# Patient Record
Sex: Female | Born: 1950 | Race: White | Hispanic: No | Marital: Married | Smoking: Never smoker
Health system: Southern US, Community
[De-identification: ages and names within clinical notes are randomized; demographics above are authoritative.]

## PROBLEM LIST (undated history)

## (undated) DIAGNOSIS — I10 Essential (primary) hypertension: Secondary | ICD-10-CM

## (undated) DIAGNOSIS — E119 Type 2 diabetes mellitus without complications: Secondary | ICD-10-CM

## (undated) DIAGNOSIS — E66811 Obesity, class 1: Secondary | ICD-10-CM

## (undated) DIAGNOSIS — I341 Nonrheumatic mitral (valve) prolapse: Secondary | ICD-10-CM

## (undated) DIAGNOSIS — I639 Cerebral infarction, unspecified: Secondary | ICD-10-CM

## (undated) DIAGNOSIS — G43909 Migraine, unspecified, not intractable, without status migrainosus: Secondary | ICD-10-CM

## (undated) DIAGNOSIS — E669 Obesity, unspecified: Secondary | ICD-10-CM

## (undated) DIAGNOSIS — N200 Calculus of kidney: Secondary | ICD-10-CM

## (undated) DIAGNOSIS — G4730 Sleep apnea, unspecified: Secondary | ICD-10-CM

## (undated) HISTORY — PX: NECK SURGERY: SHX720

## (undated) HISTORY — PX: TONSILLECTOMY: SUR1361

## (undated) HISTORY — PX: CHOLECYSTECTOMY: SHX55

## (undated) HISTORY — PX: ANKLE SURGERY: SHX546

## (undated) HISTORY — PX: KNEE SURGERY: SHX244

## (undated) HISTORY — PX: ABDOMINAL HYSTERECTOMY: SHX81

---

## 2015-03-24 DIAGNOSIS — E039 Hypothyroidism, unspecified: Secondary | ICD-10-CM | POA: Diagnosis present

## 2015-05-05 DIAGNOSIS — R519 Headache, unspecified: Secondary | ICD-10-CM | POA: Diagnosis present

## 2015-10-28 DIAGNOSIS — M5030 Other cervical disc degeneration, unspecified cervical region: Secondary | ICD-10-CM | POA: Insufficient documentation

## 2016-08-09 DIAGNOSIS — E6609 Other obesity due to excess calories: Secondary | ICD-10-CM | POA: Insufficient documentation

## 2017-04-26 ENCOUNTER — Other Ambulatory Visit: Payer: Self-pay | Admitting: Physician Assistant

## 2017-04-26 DIAGNOSIS — R921 Mammographic calcification found on diagnostic imaging of breast: Secondary | ICD-10-CM

## 2017-04-29 ENCOUNTER — Ambulatory Visit
Admission: RE | Admit: 2017-04-29 | Discharge: 2017-04-29 | Disposition: A | Discharge status: Home / Self Care | Payer: Medicare Other | Source: Ambulatory Visit | Attending: Physician Assistant | Admitting: Physician Assistant

## 2017-04-29 DIAGNOSIS — R921 Mammographic calcification found on diagnostic imaging of breast: Secondary | ICD-10-CM

## 2021-02-07 ENCOUNTER — Emergency Department (HOSPITAL_COMMUNITY): Payer: Medicare Other

## 2021-02-07 ENCOUNTER — Inpatient Hospital Stay (HOSPITAL_COMMUNITY)
Admission: EM | Admit: 2021-02-07 | Discharge: 2021-02-10 | DRG: 062 | Disposition: A | Discharge status: Rehabilitation Facility | Payer: Medicare Other | Attending: Internal Medicine | Admitting: Internal Medicine

## 2021-02-07 DIAGNOSIS — R531 Weakness: Secondary | ICD-10-CM | POA: Diagnosis present

## 2021-02-07 DIAGNOSIS — M25511 Pain in right shoulder: Secondary | ICD-10-CM | POA: Diagnosis present

## 2021-02-07 DIAGNOSIS — I6381 Other cerebral infarction due to occlusion or stenosis of small artery: Principal | ICD-10-CM | POA: Diagnosis present

## 2021-02-07 DIAGNOSIS — Z8669 Personal history of other diseases of the nervous system and sense organs: Secondary | ICD-10-CM | POA: Diagnosis not present

## 2021-02-07 DIAGNOSIS — Z79899 Other long term (current) drug therapy: Secondary | ICD-10-CM | POA: Diagnosis not present

## 2021-02-07 DIAGNOSIS — R29810 Facial weakness: Secondary | ICD-10-CM | POA: Diagnosis present

## 2021-02-07 DIAGNOSIS — I10 Essential (primary) hypertension: Secondary | ICD-10-CM | POA: Diagnosis present

## 2021-02-07 DIAGNOSIS — Z887 Allergy status to serum and vaccine status: Secondary | ICD-10-CM

## 2021-02-07 DIAGNOSIS — K5900 Constipation, unspecified: Secondary | ICD-10-CM | POA: Diagnosis present

## 2021-02-07 DIAGNOSIS — G43909 Migraine, unspecified, not intractable, without status migrainosus: Secondary | ICD-10-CM | POA: Diagnosis present

## 2021-02-07 DIAGNOSIS — I6931 Attention and concentration deficit following cerebral infarction: Secondary | ICD-10-CM | POA: Diagnosis not present

## 2021-02-07 DIAGNOSIS — I639 Cerebral infarction, unspecified: Secondary | ICD-10-CM | POA: Diagnosis not present

## 2021-02-07 DIAGNOSIS — Z79890 Hormone replacement therapy (postmenopausal): Secondary | ICD-10-CM | POA: Diagnosis not present

## 2021-02-07 DIAGNOSIS — G4733 Obstructive sleep apnea (adult) (pediatric): Secondary | ICD-10-CM | POA: Diagnosis present

## 2021-02-07 DIAGNOSIS — E1165 Type 2 diabetes mellitus with hyperglycemia: Secondary | ICD-10-CM | POA: Diagnosis present

## 2021-02-07 DIAGNOSIS — I080 Rheumatic disorders of both mitral and aortic valves: Secondary | ICD-10-CM | POA: Diagnosis present

## 2021-02-07 DIAGNOSIS — Z91013 Allergy to seafood: Secondary | ICD-10-CM | POA: Diagnosis not present

## 2021-02-07 DIAGNOSIS — I69354 Hemiplegia and hemiparesis following cerebral infarction affecting left non-dominant side: Secondary | ICD-10-CM | POA: Diagnosis present

## 2021-02-07 DIAGNOSIS — Z881 Allergy status to other antibiotic agents status: Secondary | ICD-10-CM | POA: Diagnosis not present

## 2021-02-07 DIAGNOSIS — R270 Ataxia, unspecified: Secondary | ICD-10-CM | POA: Diagnosis present

## 2021-02-07 DIAGNOSIS — I341 Nonrheumatic mitral (valve) prolapse: Secondary | ICD-10-CM | POA: Diagnosis present

## 2021-02-07 DIAGNOSIS — Z6833 Body mass index (BMI) 33.0-33.9, adult: Secondary | ICD-10-CM

## 2021-02-07 DIAGNOSIS — G8194 Hemiplegia, unspecified affecting left nondominant side: Secondary | ICD-10-CM | POA: Diagnosis present

## 2021-02-07 DIAGNOSIS — Z7902 Long term (current) use of antithrombotics/antiplatelets: Secondary | ICD-10-CM | POA: Diagnosis not present

## 2021-02-07 DIAGNOSIS — E119 Type 2 diabetes mellitus without complications: Secondary | ICD-10-CM | POA: Diagnosis present

## 2021-02-07 DIAGNOSIS — K219 Gastro-esophageal reflux disease without esophagitis: Secondary | ICD-10-CM | POA: Diagnosis present

## 2021-02-07 DIAGNOSIS — W19XXXA Unspecified fall, initial encounter: Secondary | ICD-10-CM | POA: Diagnosis present

## 2021-02-07 DIAGNOSIS — E669 Obesity, unspecified: Secondary | ICD-10-CM | POA: Diagnosis present

## 2021-02-07 DIAGNOSIS — Z888 Allergy status to other drugs, medicaments and biological substances status: Secondary | ICD-10-CM | POA: Diagnosis not present

## 2021-02-07 DIAGNOSIS — E785 Hyperlipidemia, unspecified: Secondary | ICD-10-CM | POA: Diagnosis present

## 2021-02-07 DIAGNOSIS — G43709 Chronic migraine without aura, not intractable, without status migrainosus: Secondary | ICD-10-CM | POA: Diagnosis not present

## 2021-02-07 DIAGNOSIS — Z7982 Long term (current) use of aspirin: Secondary | ICD-10-CM

## 2021-02-07 DIAGNOSIS — E1169 Type 2 diabetes mellitus with other specified complication: Secondary | ICD-10-CM | POA: Diagnosis not present

## 2021-02-07 DIAGNOSIS — Z91041 Radiographic dye allergy status: Secondary | ICD-10-CM | POA: Diagnosis not present

## 2021-02-07 DIAGNOSIS — J45909 Unspecified asthma, uncomplicated: Secondary | ICD-10-CM | POA: Diagnosis present

## 2021-02-07 DIAGNOSIS — Z20822 Contact with and (suspected) exposure to covid-19: Secondary | ICD-10-CM | POA: Diagnosis present

## 2021-02-07 DIAGNOSIS — E039 Hypothyroidism, unspecified: Secondary | ICD-10-CM | POA: Diagnosis present

## 2021-02-07 DIAGNOSIS — R471 Dysarthria and anarthria: Secondary | ICD-10-CM | POA: Diagnosis present

## 2021-02-07 DIAGNOSIS — I6389 Other cerebral infarction: Secondary | ICD-10-CM | POA: Diagnosis not present

## 2021-02-07 DIAGNOSIS — S46001S Unspecified injury of muscle(s) and tendon(s) of the rotator cuff of right shoulder, sequela: Secondary | ICD-10-CM | POA: Diagnosis not present

## 2021-02-07 DIAGNOSIS — Z880 Allergy status to penicillin: Secondary | ICD-10-CM

## 2021-02-07 DIAGNOSIS — Z7984 Long term (current) use of oral hypoglycemic drugs: Secondary | ICD-10-CM | POA: Diagnosis not present

## 2021-02-07 DIAGNOSIS — R29703 NIHSS score 3: Secondary | ICD-10-CM | POA: Diagnosis present

## 2021-02-07 DIAGNOSIS — Z91018 Allergy to other foods: Secondary | ICD-10-CM

## 2021-02-07 DIAGNOSIS — R200 Anesthesia of skin: Secondary | ICD-10-CM

## 2021-02-07 DIAGNOSIS — F419 Anxiety disorder, unspecified: Secondary | ICD-10-CM | POA: Diagnosis present

## 2021-02-07 DIAGNOSIS — N179 Acute kidney failure, unspecified: Secondary | ICD-10-CM | POA: Diagnosis present

## 2021-02-07 LAB — CBC
HCT: 36.8 % (ref 36.0–46.0)
Hemoglobin: 12.1 g/dL (ref 12.0–15.0)
MCH: 27.4 pg (ref 26.0–34.0)
MCHC: 32.9 g/dL (ref 30.0–36.0)
MCV: 83.3 fL (ref 80.0–100.0)
Platelets: 261 10*3/uL (ref 150–400)
RBC: 4.42 MIL/uL (ref 3.87–5.11)
RDW: 13.3 % (ref 11.5–15.5)
WBC: 10.6 10*3/uL — ABNORMAL HIGH (ref 4.0–10.5)
nRBC: 0 % (ref 0.0–0.2)

## 2021-02-07 LAB — COMPREHENSIVE METABOLIC PANEL
ALT: 27 U/L (ref 0–44)
AST: 26 U/L (ref 15–41)
Albumin: 3.6 g/dL (ref 3.5–5.0)
Alkaline Phosphatase: 70 U/L (ref 38–126)
Anion gap: 12 (ref 5–15)
BUN: 17 mg/dL (ref 8–23)
CO2: 23 mmol/L (ref 22–32)
Calcium: 9.3 mg/dL (ref 8.9–10.3)
Chloride: 100 mmol/L (ref 98–111)
Creatinine, Ser: 1.01 mg/dL — ABNORMAL HIGH (ref 0.44–1.00)
GFR, Estimated: >60 mL/min (ref >60)
Glucose, Bld: 373 mg/dL — ABNORMAL HIGH (ref 70–99)
Potassium: 4.2 mmol/L (ref 3.5–5.1)
Sodium: 135 mmol/L (ref 135–145)
Total Bilirubin: 0.6 mg/dL (ref 0.3–1.2)
Total Protein: 7.1 g/dL (ref 6.5–8.1)

## 2021-02-07 LAB — DIFFERENTIAL
Abs Immature Granulocytes: 0.07 10*3/uL (ref 0.00–0.07)
Basophils Absolute: 0.1 10*3/uL (ref 0.0–0.1)
Basophils Relative: 1 %
Eosinophils Absolute: 0.4 10*3/uL (ref 0.0–0.5)
Eosinophils Relative: 4 %
Immature Granulocytes: 1 %
Lymphocytes Relative: 18 %
Lymphs Abs: 1.9 10*3/uL (ref 0.7–4.0)
Monocytes Absolute: 0.4 10*3/uL (ref 0.1–1.0)
Monocytes Relative: 4 %
Neutro Abs: 7.8 10*3/uL — ABNORMAL HIGH (ref 1.7–7.7)
Neutrophils Relative %: 72 %

## 2021-02-07 LAB — I-STAT CHEM 8, ED
BUN: 18 mg/dL (ref 8–23)
Calcium, Ion: 1.04 mmol/L — ABNORMAL LOW (ref 1.15–1.40)
Chloride: 101 mmol/L (ref 98–111)
Creatinine, Ser: 0.8 mg/dL (ref 0.44–1.00)
Glucose, Bld: 382 mg/dL — ABNORMAL HIGH (ref 70–99)
HCT: 36 % (ref 36.0–46.0)
Hemoglobin: 12.2 g/dL (ref 12.0–15.0)
Potassium: 4.1 mmol/L (ref 3.5–5.1)
Sodium: 135 mmol/L (ref 135–145)
TCO2: 24 mmol/L (ref 22–32)

## 2021-02-07 LAB — CBG MONITORING, ED: Glucose-Capillary: 408 mg/dL — ABNORMAL HIGH (ref 70–99)

## 2021-02-07 LAB — APTT: aPTT: 30 seconds (ref 24–36)

## 2021-02-07 LAB — PROTIME-INR
INR: 1 (ref 0.8–1.2)
Prothrombin Time: 13 seconds (ref 11.4–15.2)

## 2021-02-07 IMAGING — MR MR HEAD W/O CM
12 of 14 series · 33 of 48 positions shown · IV contrast (gadavist)
Comparison: Head CT from earlier the same day.

CLINICAL DATA: Initial evaluation for neuro deficit, stroke
suspected.

EXAM:
MRI HEAD WITHOUT CONTRAST
MRA HEAD WITHOUT CONTRAST
MRA NECK WITHOUT AND WITH CONTRAST
TECHNIQUE: Multiplanar, multi-echo pulse sequences of the brain and surrounding
structures were acquired without intravenous contrast. Angiographic
images of the Circle of Willis were acquired using MRA technique
without intravenous contrast. Angiographic images of the neck were
acquired using MRA technique without and with intravenous contrast.
Carotid stenosis measurements (when applicable) are obtained
utilizing NASCET criteria, using the distal internal carotid
diameter as the denominator.
CONTRAST:  7mL GADAVIST GADOBUTROL 1 MMOL/ML IV SOLN

[Series 5: DWI · axial · 3.0mm · 0.88mm/px · z∈[-128,+18]mm · 7 of 100 slices shown (1 of 4)]
[im 1/100]
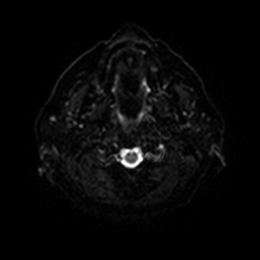
[im 17/100]
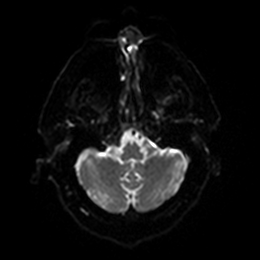
[im 34/100]
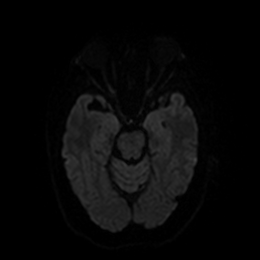
[im 50/100]
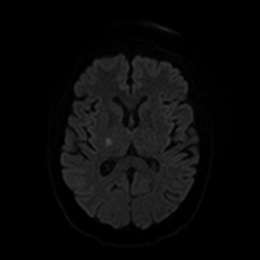
[im 67/100]
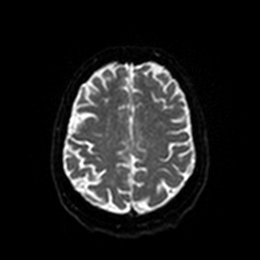
[im 83/100]
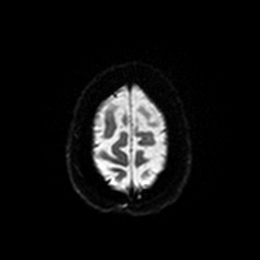
[im 100/100]
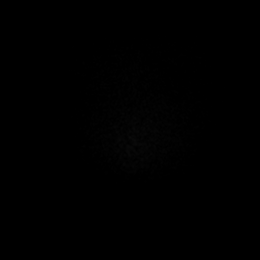

[Series 6: DWI · axial · 3.0mm · 0.88mm/px · z∈[-128,+18]mm · 3 of 50 slices shown (2 of 4)]
[im 1/50]
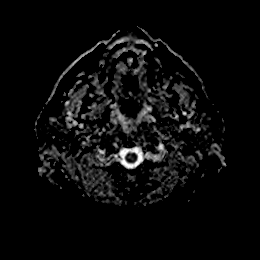
[im 25/50]
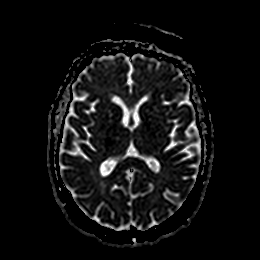
[im 50/50]
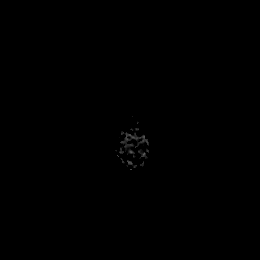

[Series 7: DWI · coronal · 4.0mm · 0.88mm/px · 4 of 68 slices shown (3 of 4)]
[im 1/68]
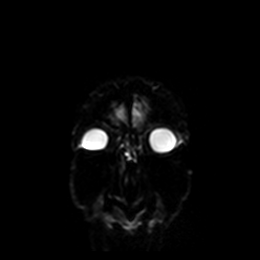
[im 23/68]
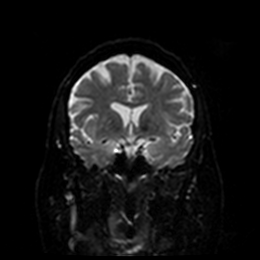
[im 45/68]
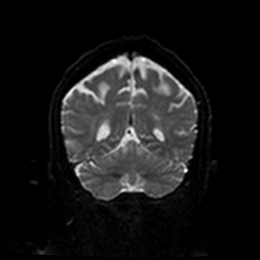
[im 68/68]
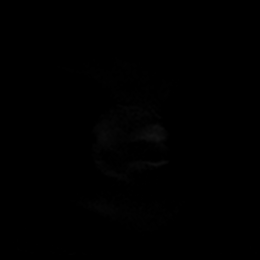

[Series 8: DWI · coronal · 4.0mm · 0.88mm/px · 2 of 34 slices shown (4 of 4)]
[im 1/34]
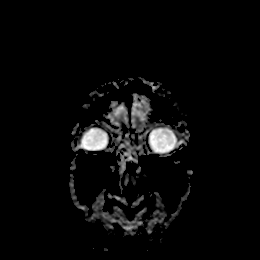
[im 34/34]
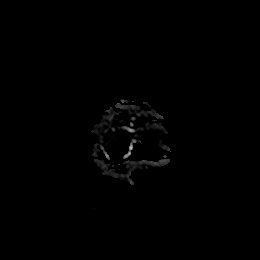

[Series 9: FLAIR · axial · 5.0mm · 0.45mm/px · 1 of 25 slices shown]
[im 1/25]
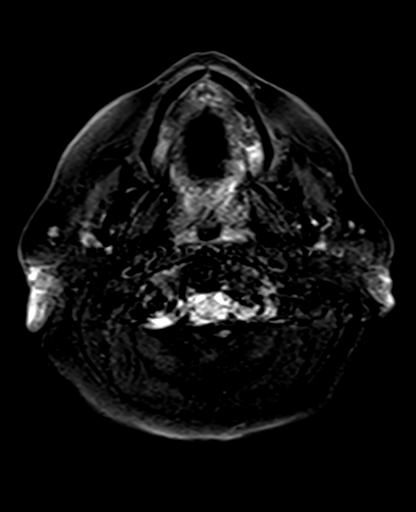

[Series 10: mag_images · axial · 3.0mm · 0.90mm/px · z∈[-131,+22]mm · 3 of 52 slices shown]
[im 1/52]
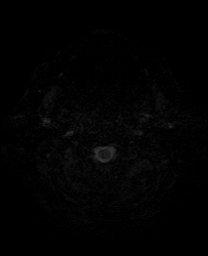
[im 26/52]
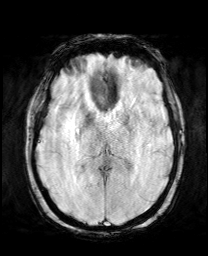
[im 52/52]
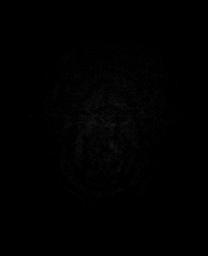

[Series 11: pha_images · axial · 3.0mm · 0.90mm/px · z∈[-131,+19]mm · 3 of 51 slices shown]
[im 1/51]
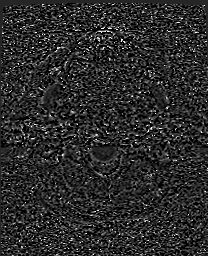
[im 26/51]
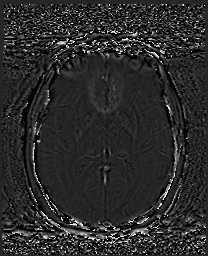
[im 51/51]
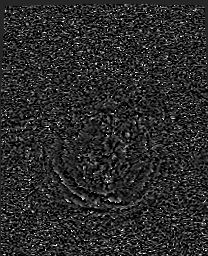

[Series 12: swi_images · axial · 3.0mm · 0.90mm/px · z∈[-131,+22]mm · 3 of 52 slices shown]
[im 1/52]
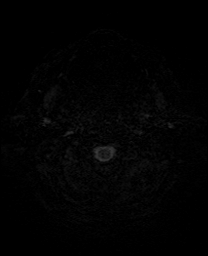
[im 26/52]
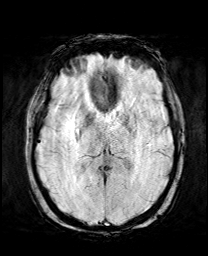
[im 52/52]
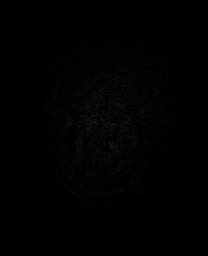

[Series 13: mip_images(sw) · axial · 24.0mm · 0.90mm/px · z∈[-120,+11]mm · 3 of 45 slices shown]
[im 1/45]
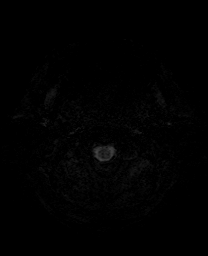
[im 23/45]
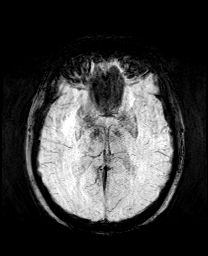
[im 45/45]
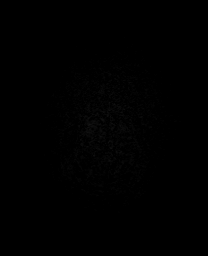

[Series 18: T1 · sagittal · 5.0mm · 0.75mm/px · 1 of 23 slices shown]
[im 1/23]
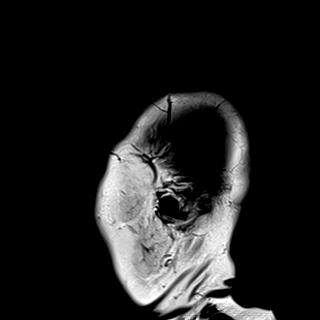

[Series 19: T2 · axial · 5.0mm · 0.72mm/px · 1 of 25 slices shown (1 of 2)]
[im 1/25]
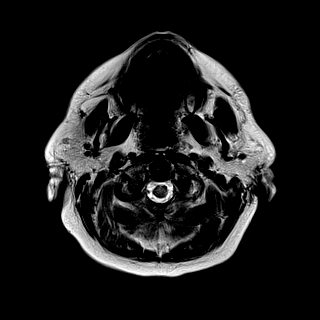

[Series 21: T2 · coronal · 4.0mm · 0.34mm/px · 2 of 34 slices shown (2 of 2)]
[im 1/34]
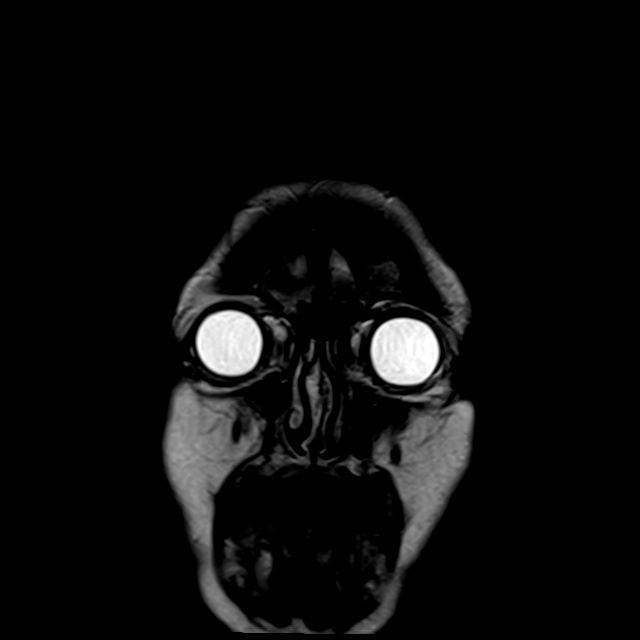
[im 34/34]
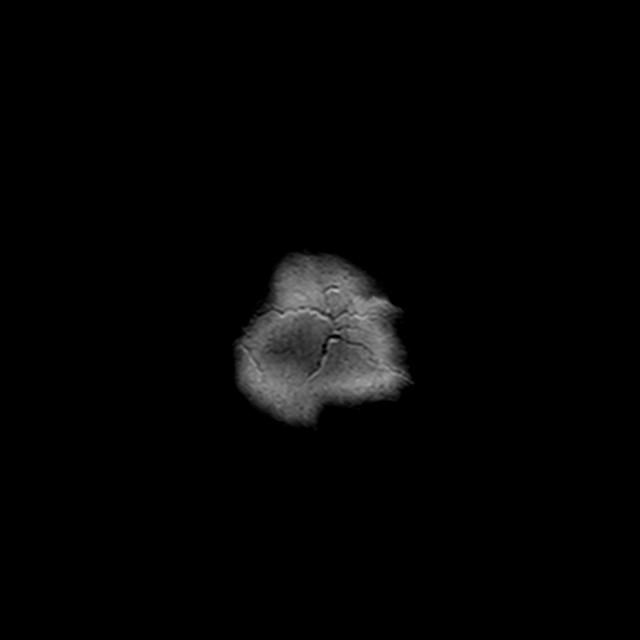

[33 of 48 positions shown; findings below may reference images not displayed]

FINDINGS: MRI HEAD FINDINGS

Brain: Cerebral volume within normal limits. Minimal chronic
microvascular ischemic disease noted involving the pons.

7 mm focus of restricted diffusion seen involving the posterior limb
of the right internal capsule, consistent with an acute ischemic
infarct (series 5, image 75). No associated hemorrhage or mass
effect. No other evidence for acute or subacute ischemia. No other
areas of chronic cortical infarction. No evidence for acute or
chronic intracranial hemorrhage.

No mass lesion or midline shift. No hydrocephalus or extra-axial
fluid collection. Pituitary gland suprasellar region normal.

Vascular: Major intracranial vascular flow voids are well
maintained.

Skull and upper cervical spine: Craniocervical junction within
normal limits. Bone marrow signal intensity normal. No scalp soft
tissue abnormality.

Sinuses/Orbits: Patient status post bilateral ocular lens
replacement. Paranasal sinuses are largely clear. No significant
mastoid effusion. Inner ear structures grossly normal.

Other: None.

MRA HEAD FINDINGS

Anterior circulation: Examination mildly degraded by motion.

Visualized distal cervical segments of the internal carotid arteries
are patent with antegrade flow. Petrous, cavernous, and supraclinoid
segments patent without stenosis or other abnormality. Eight A1
segments widely patent. Normal anterior communicating artery
complex. Anterior cerebral arteries patent without stenosis. No M1
stenosis or occlusion. Normal MCA bifurcations. Distal MCA branches
perfused and symmetric.

Posterior circulation: Both V4 segments patent without stenosis.
Right vertebral artery dominant. Both PICA patent at their origins.
Basilar widely patent to its distal aspect. Superior cerebellar and
posterior cerebral arteries widely patent bilaterally.

Anatomic variants: None significant.  No aneurysm.

MRA NECK FINDINGS

Aortic arch: Examination degraded by motion artifact.

Visualized aortic arch normal caliber with normal branch pattern. No
hemodynamically significant stenosis seen about the origin the great
vessels.

Right carotid system: Right common and internal carotid arteries
widely patent without stenosis, evidence for dissection or
occlusion.

Left carotid system: Left common and internal carotid arteries
widely patent without stenosis, evidence for dissection or
occlusion.

Vertebral arteries: Both vertebral arteries arise from the
subclavian arteries. No proximal subclavian artery stenosis. Both
vertebral arteries widely patent without stenosis, evidence for
dissection or occlusion. Right vertebral artery dominant.

Other: None
IMPRESSION: MRI HEAD IMPRESSION:

1. 7 mm acute ischemic nonhemorrhagic infarct involving the
posterior limb of the right internal capsule.
2. Mild chronic microvascular ischemic disease involving the pons.

MRA HEAD IMPRESSION:

Normal intracranial MRA. No large vessel occlusion, hemodynamically
significant stenosis, or other acute vascular abnormality.

MRA NECK IMPRESSION:

Normal MRA of the neck.

## 2021-02-07 IMAGING — MR MR MRA NECK WO/W CM
5 of 8 series · 26 of 48 positions shown · IV contrast (7 mL GADAVIST)
Comparison: Head CT from earlier the same day.

CLINICAL DATA: Initial evaluation for neuro deficit, stroke
suspected.

EXAM:
MRI HEAD WITHOUT CONTRAST
MRA HEAD WITHOUT CONTRAST
MRA NECK WITHOUT AND WITH CONTRAST
TECHNIQUE: Multiplanar, multi-echo pulse sequences of the brain and surrounding
structures were acquired without intravenous contrast. Angiographic
images of the Circle of Willis were acquired using MRA technique
without intravenous contrast. Angiographic images of the neck were
acquired using MRA technique without and with intravenous contrast.
Carotid stenosis measurements (when applicable) are obtained
utilizing NASCET criteria, using the distal internal carotid
diameter as the denominator.
CONTRAST:  7mL GADAVIST GADOBUTROL 1 MMOL/ML IV SOLN

[Series 8: tof_fl3d_tra_iso · axial · B · 0.6mm · 0.52mm/px · z∈[-279,-105]mm · 9 of 292 slices shown]
[im 1/292]
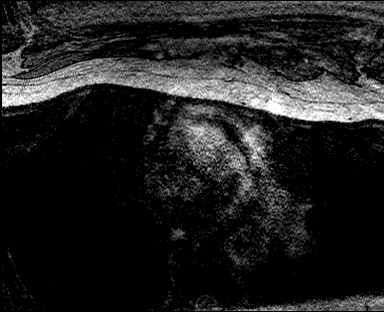
[im 49/292]
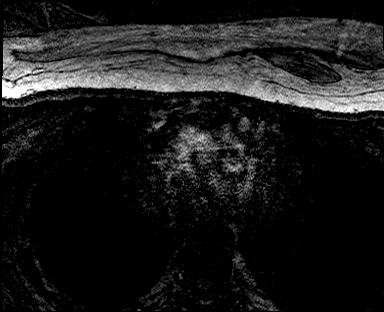
[im 98/292]
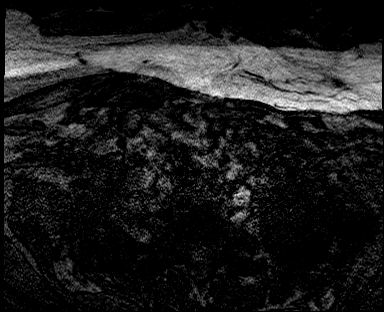
[im 122/292]
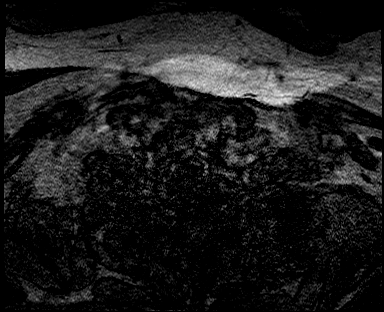
[im 146/292]
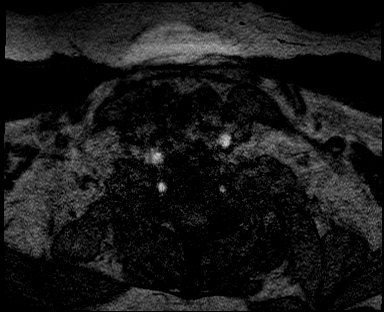
[im 170/292]
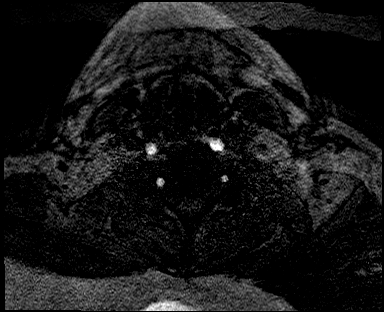
[im 195/292]
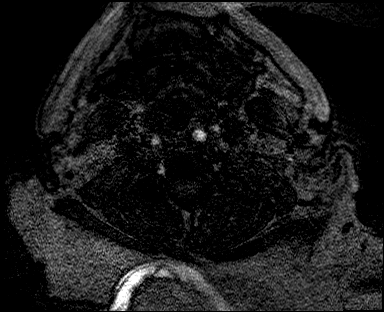
[im 243/292]
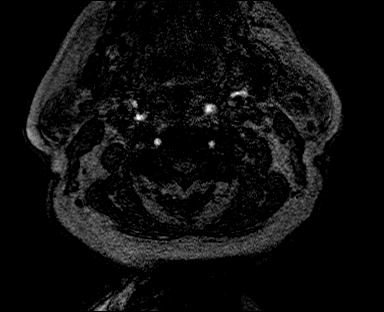
[im 292/292]
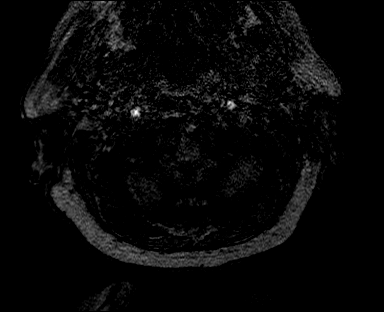

[Series 11: angio_fl3d_cor_highres_pre_ttc=3.0s · coronal · B · 0.9mm · 0.62mm/px · 5 of 112 slices shown]
[im 1/112]
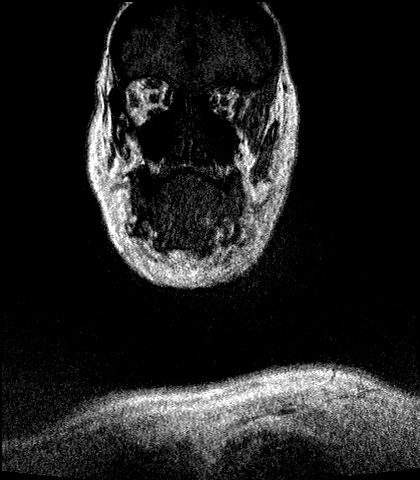
[im 28/112]
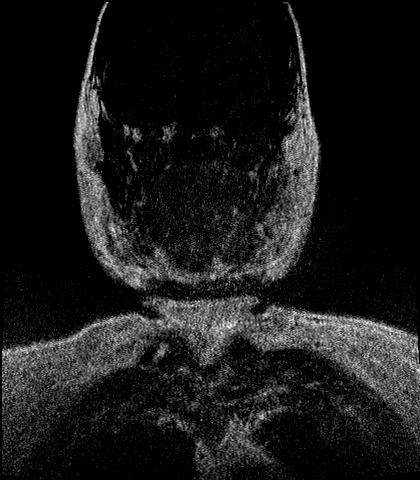
[im 56/112]
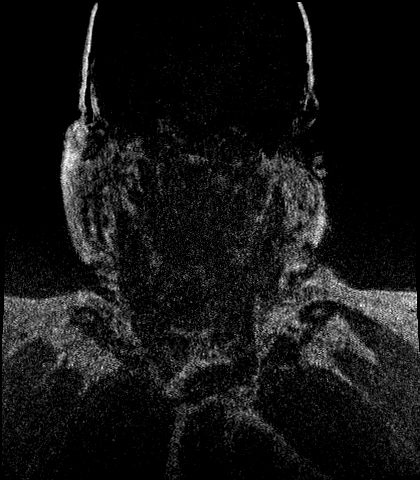
[im 84/112]
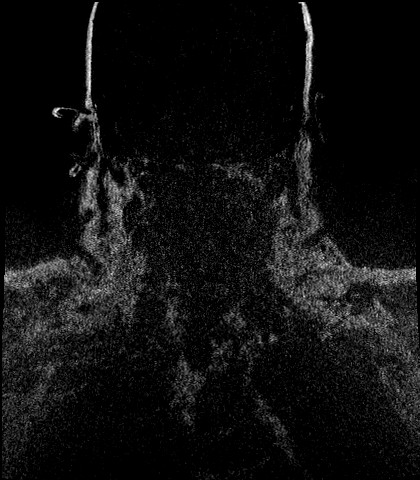
[im 112/112]
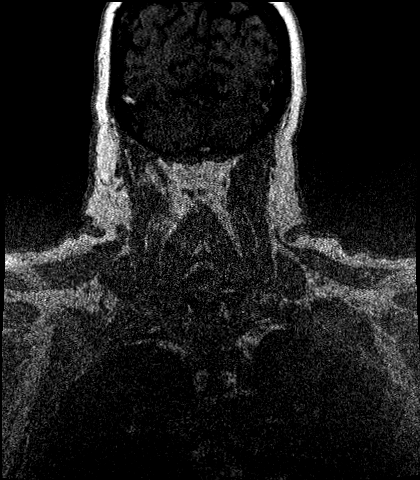

[Series 13: angio_fl3d_cor_highres_post_ttc=3.0s · coronal · B · 0.9mm · 0.62mm/px · 5 of 112 slices shown]
[im 1/112]
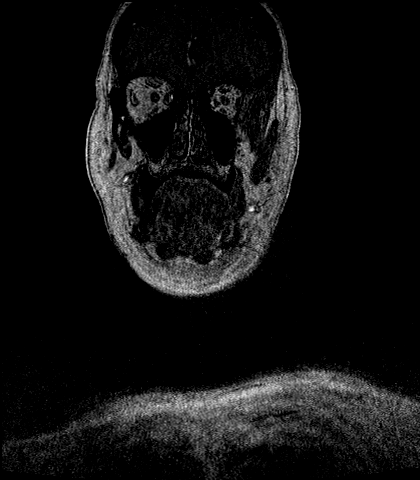
[im 28/112]
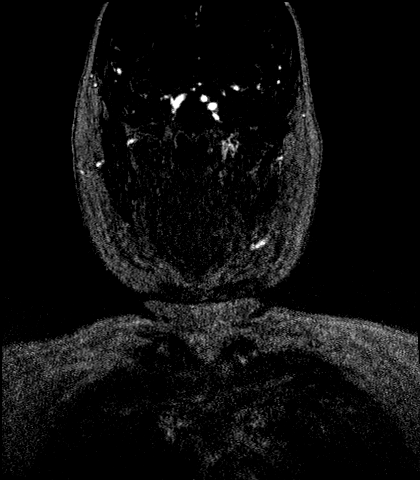
[im 56/112]
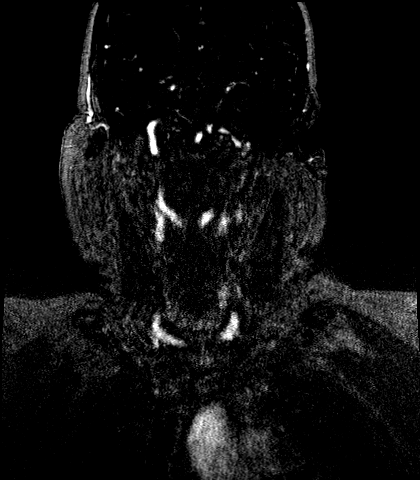
[im 84/112]
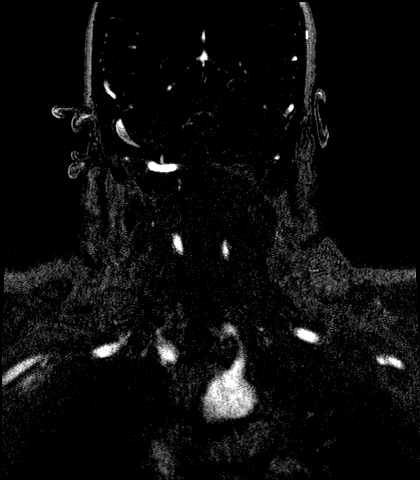
[im 112/112]
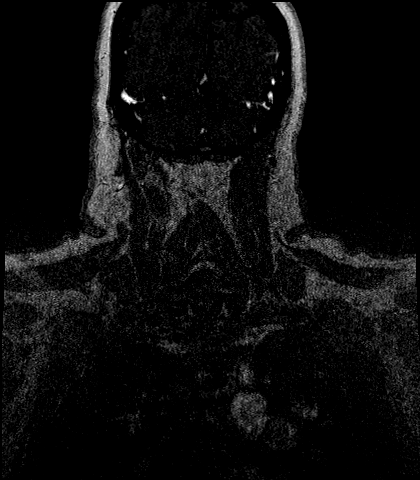

[Series 14: angio_fl3d_cor_highres_post_ttc=3.0s_moco-adv · coronal · B · 0.9mm · 0.62mm/px · 5 of 112 slices shown]
[im 1/112]
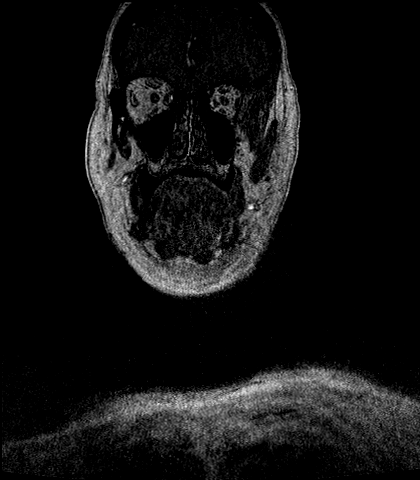
[im 28/112]
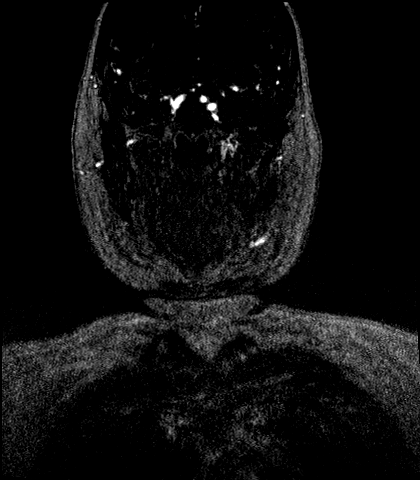
[im 56/112]
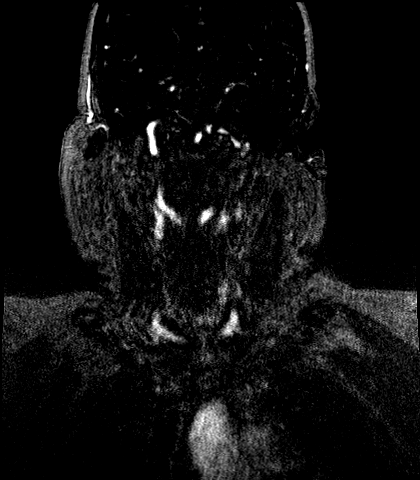
[im 84/112]
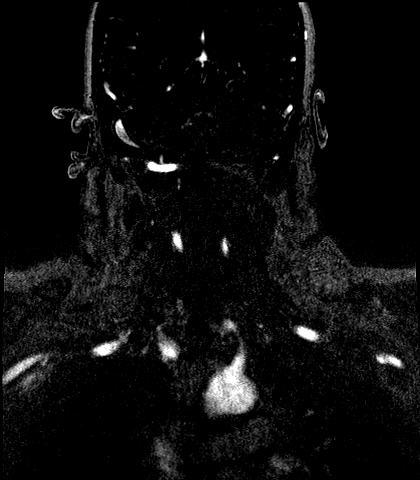
[im 112/112]
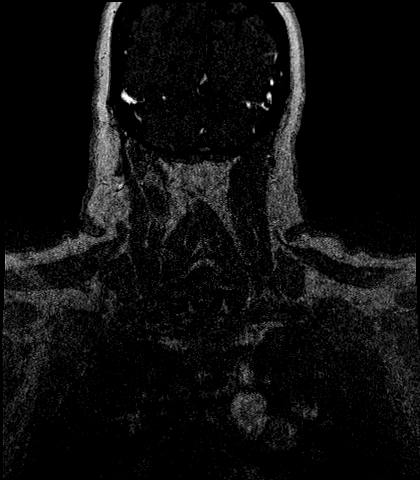

[Series 15: angio_fl3d_cor_highres_post_ttc=3.0s_moco-adv_sub · coronal · B · 0.9mm · 0.62mm/px · 2 of 112 slices shown]
[im 1/112]
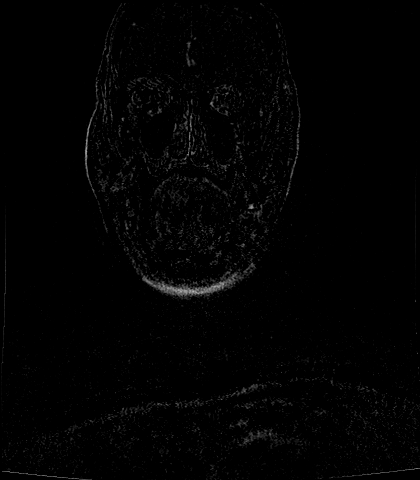
[im 28/112]
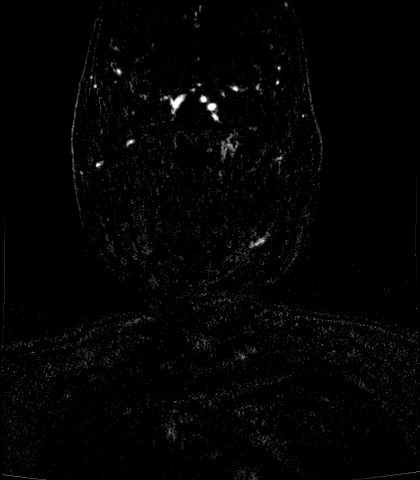

[26 of 48 positions shown; findings below may reference images not displayed]

FINDINGS: MRI HEAD FINDINGS

Brain: Cerebral volume within normal limits. Minimal chronic
microvascular ischemic disease noted involving the pons.

7 mm focus of restricted diffusion seen involving the posterior limb
of the right internal capsule, consistent with an acute ischemic
infarct (series 5, image 75). No associated hemorrhage or mass
effect. No other evidence for acute or subacute ischemia. No other
areas of chronic cortical infarction. No evidence for acute or
chronic intracranial hemorrhage.

No mass lesion or midline shift. No hydrocephalus or extra-axial
fluid collection. Pituitary gland suprasellar region normal.

Vascular: Major intracranial vascular flow voids are well
maintained.

Skull and upper cervical spine: Craniocervical junction within
normal limits. Bone marrow signal intensity normal. No scalp soft
tissue abnormality.

Sinuses/Orbits: Patient status post bilateral ocular lens
replacement. Paranasal sinuses are largely clear. No significant
mastoid effusion. Inner ear structures grossly normal.

Other: None.

MRA HEAD FINDINGS

Anterior circulation: Examination mildly degraded by motion.

Visualized distal cervical segments of the internal carotid arteries
are patent with antegrade flow. Petrous, cavernous, and supraclinoid
segments patent without stenosis or other abnormality. Eight A1
segments widely patent. Normal anterior communicating artery
complex. Anterior cerebral arteries patent without stenosis. No M1
stenosis or occlusion. Normal MCA bifurcations. Distal MCA branches
perfused and symmetric.

Posterior circulation: Both V4 segments patent without stenosis.
Right vertebral artery dominant. Both PICA patent at their origins.
Basilar widely patent to its distal aspect. Superior cerebellar and
posterior cerebral arteries widely patent bilaterally.

Anatomic variants: None significant.  No aneurysm.

MRA NECK FINDINGS

Aortic arch: Examination degraded by motion artifact.

Visualized aortic arch normal caliber with normal branch pattern. No
hemodynamically significant stenosis seen about the origin the great
vessels.

Right carotid system: Right common and internal carotid arteries
widely patent without stenosis, evidence for dissection or
occlusion.

Left carotid system: Left common and internal carotid arteries
widely patent without stenosis, evidence for dissection or
occlusion.

Vertebral arteries: Both vertebral arteries arise from the
subclavian arteries. No proximal subclavian artery stenosis. Both
vertebral arteries widely patent without stenosis, evidence for
dissection or occlusion. Right vertebral artery dominant.

Other: None
IMPRESSION: MRI HEAD IMPRESSION:

1. 7 mm acute ischemic nonhemorrhagic infarct involving the
posterior limb of the right internal capsule.
2. Mild chronic microvascular ischemic disease involving the pons.

MRA HEAD IMPRESSION:

Normal intracranial MRA. No large vessel occlusion, hemodynamically
significant stenosis, or other acute vascular abnormality.

MRA NECK IMPRESSION:

Normal MRA of the neck.

## 2021-02-07 IMAGING — CT CT HEAD CODE STROKE
3 series · 14 of 47 positions shown, 16 images · non-contrast
Comparison: CT from [DATE].

CLINICAL DATA: Code stroke. Initial evaluation for acute stroke.
Right-sided facial droop.

EXAM:
CT HEAD WITHOUT CONTRAST
TECHNIQUE: Contiguous axial images were obtained from the base of the skull
through the vertex without intravenous contrast.

[Series 2: head 5.0 st · axial · 0.40mm/px · z∈[-84,+51]mm · 8 of 33 slices shown, 10 images]
[im 3/33  brain]
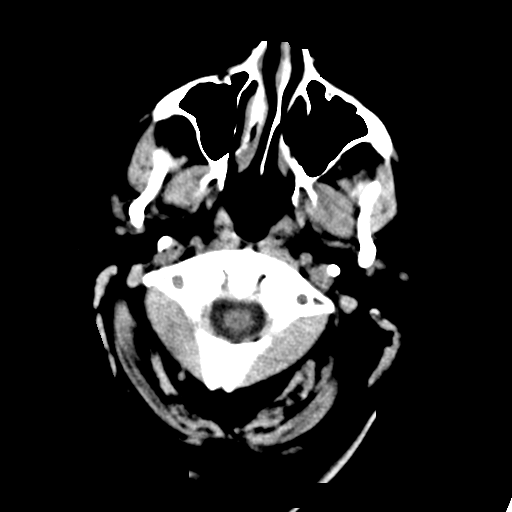
[im 3/33  bone]
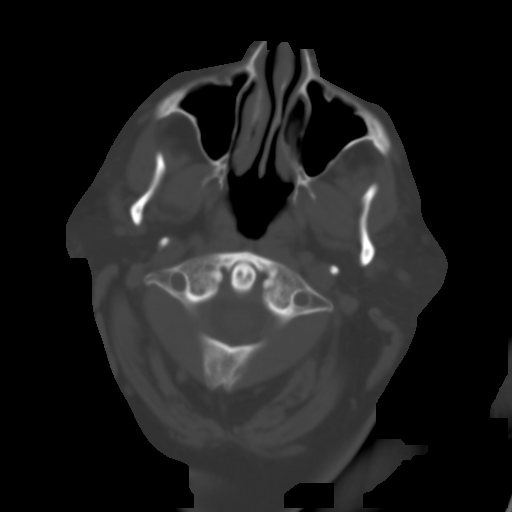
[im 7/33  brain]
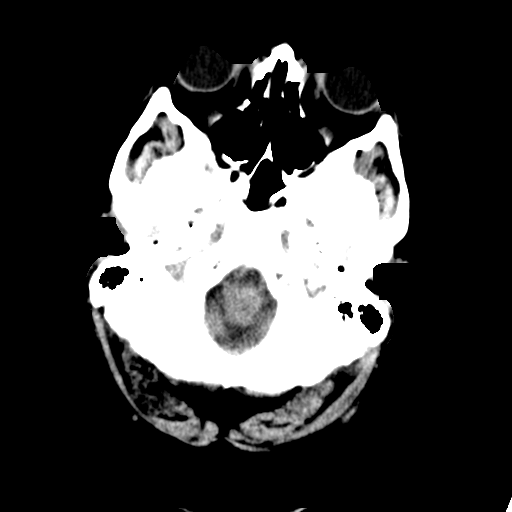
[im 10/33  brain]
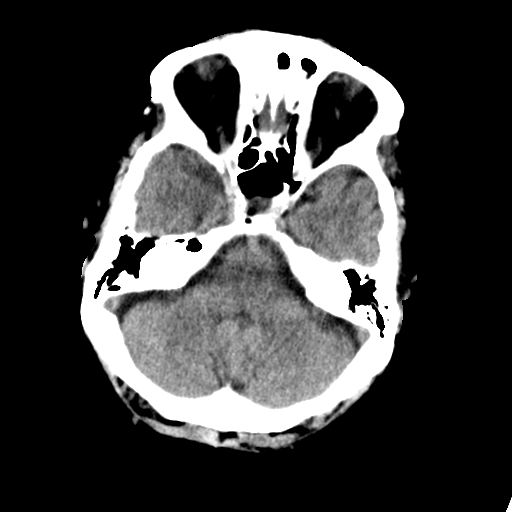
[im 15/33  brain]
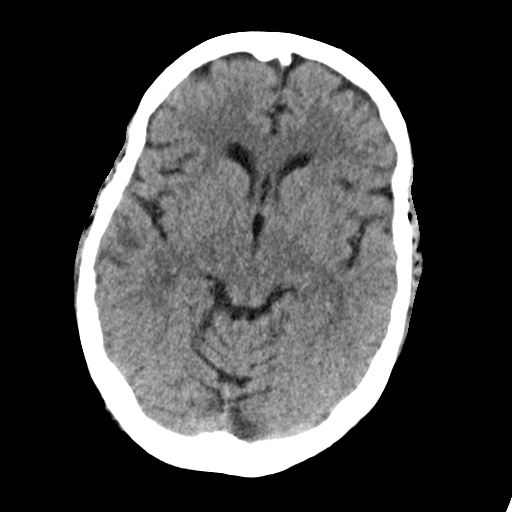
[im 18/33  brain]
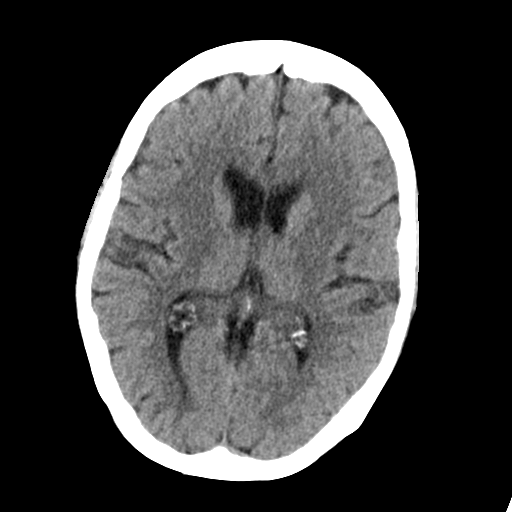
[im 18/33  bone]
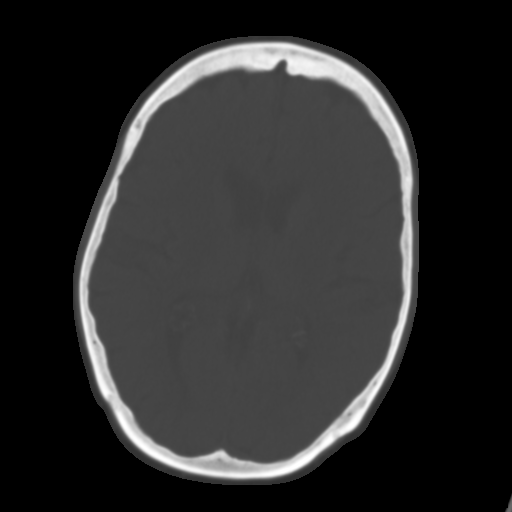
[im 23/33  brain]
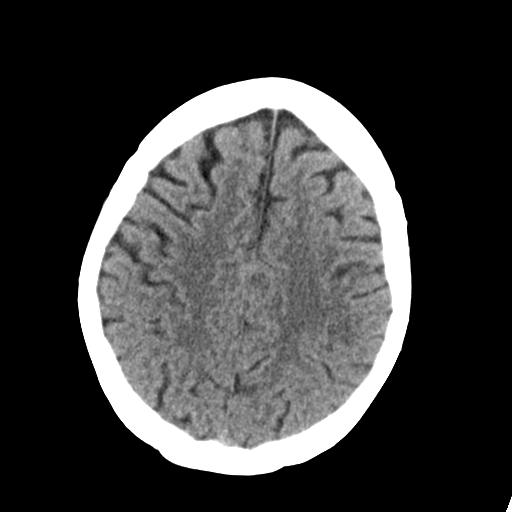
[im 26/33  brain]
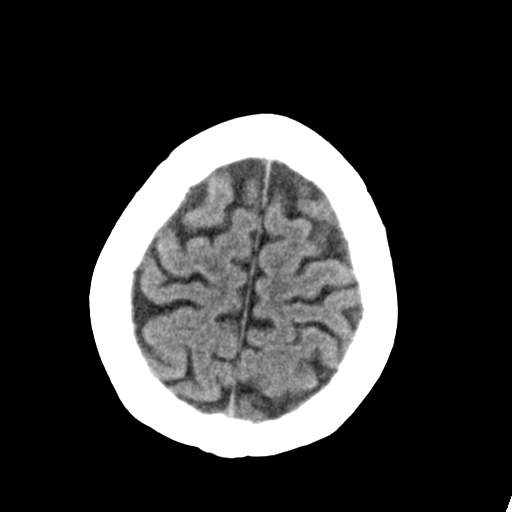
[im 30/33  brain]
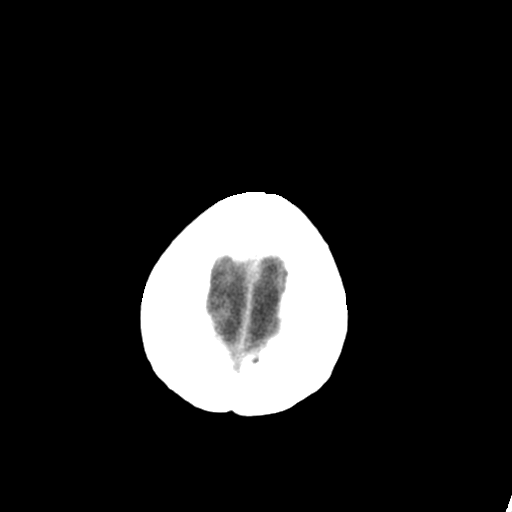

[Series 5: head 3.0 cor st · coronal · 0.31mm/px · 3 of 67 slices shown]
[im 23/67  brain]
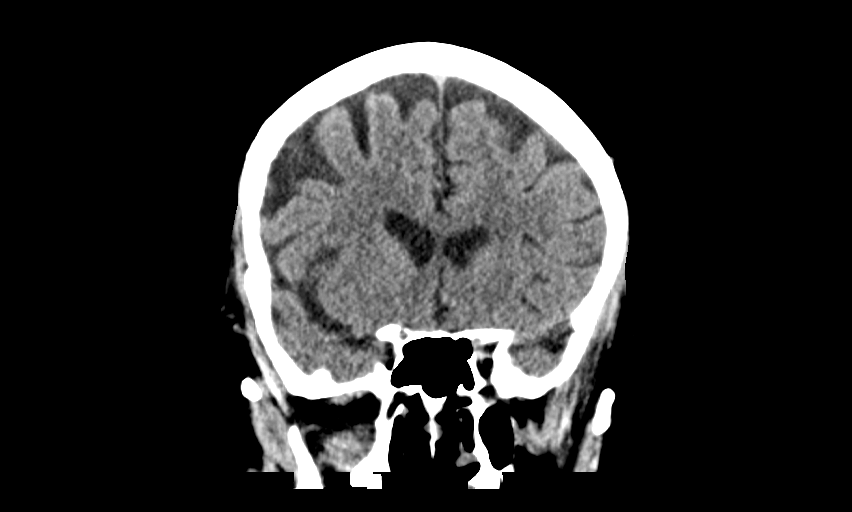
[im 30/67  brain]
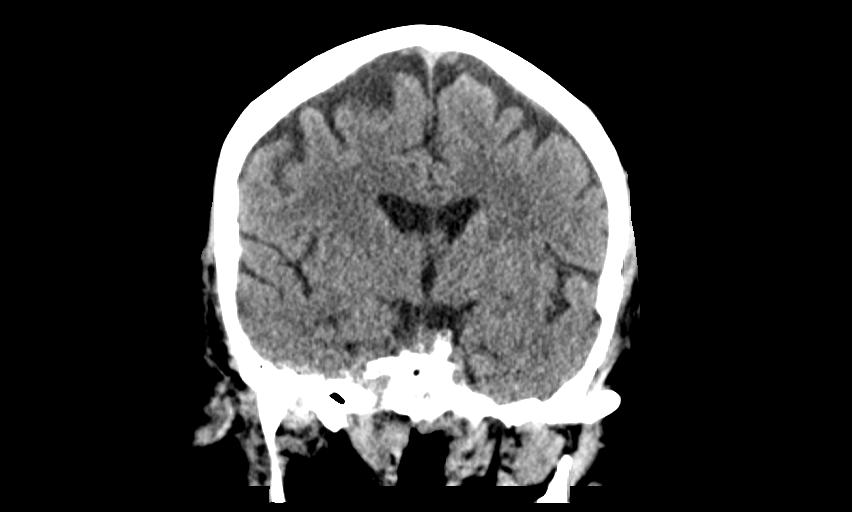
[im 37/67  brain]
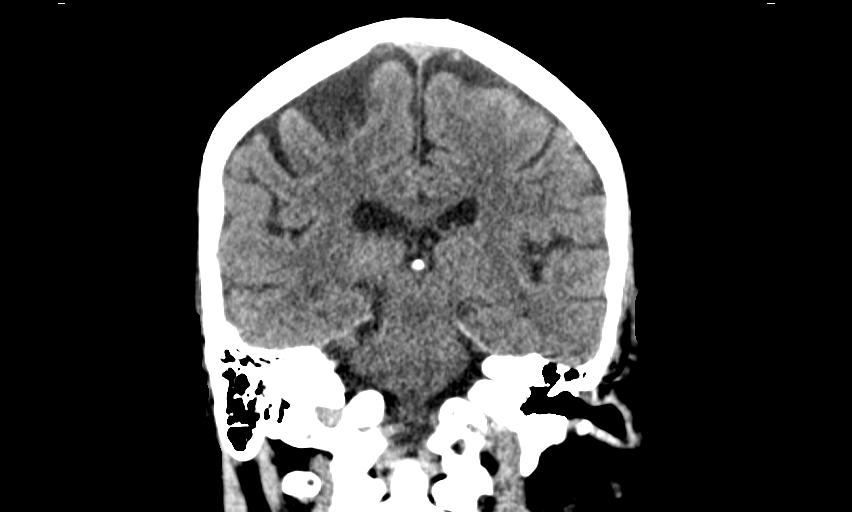

[Series 6: head 3.0 sag st · sagittal · 0.31mm/px · 3 of 65 slices shown]
[im 22/65  brain]
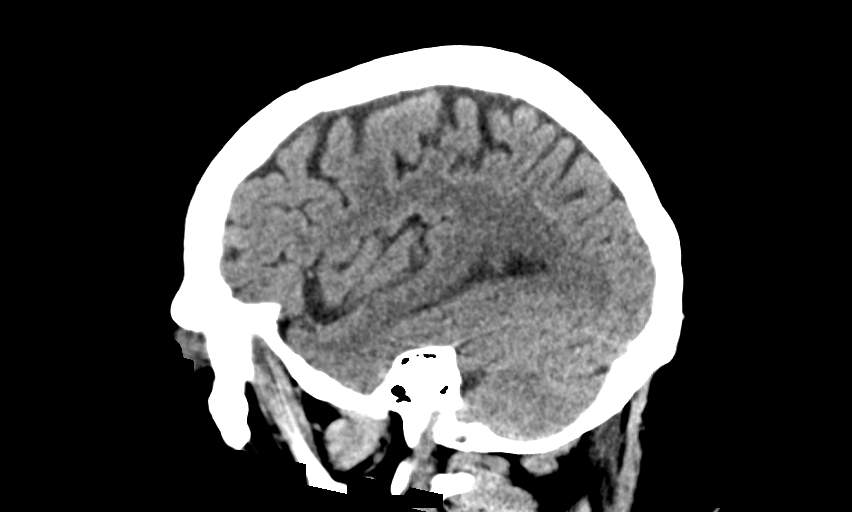
[im 33/65  brain]
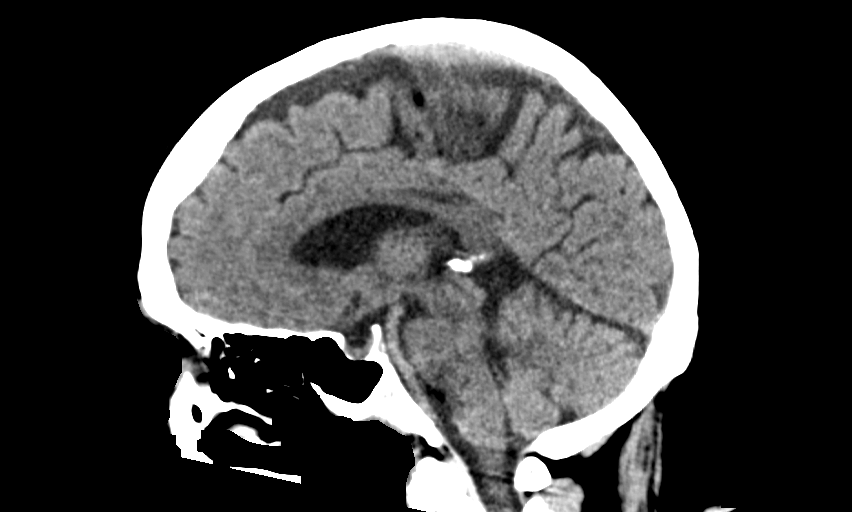
[im 43/65  brain]
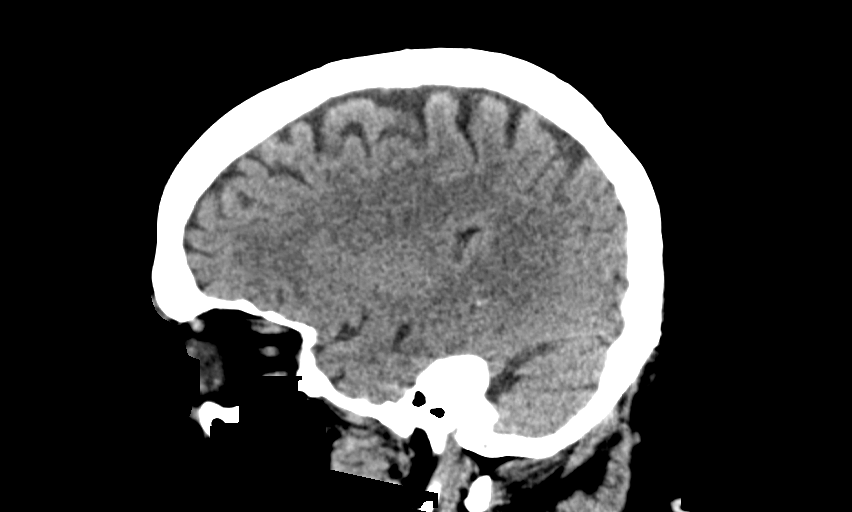

[14 of 47 positions shown; findings below may reference images not displayed]

FINDINGS: Brain: Cerebral volume within normal limits. No acute intracranial
hemorrhage. No acute large vessel territory infarct. No mass lesion,
midline shift or mass effect. No hydrocephalus or extra-axial fluid
collection.

Vascular: No hyperdense vessel.

Skull: Scalp soft tissues and calvarium within normal limits.

Sinuses/Orbits: Globes and orbital soft tissues within normal
limits. Paranasal sinuses and mastoid air cells are clear.

Other: None.

ASPECTS ([place] Stroke Program Early CT Score)

- Ganglionic level infarction (caudate, lentiform nuclei, internal
capsule, insula, M1-M3 cortex): 7

- Supraganglionic infarction (M4-M6 cortex): 3

Total score (0-10 with 10 being normal): 10
IMPRESSION: 1. No acute intracranial abnormality.
2. ASPECTS is 10.

Results were communicated by telephone at the time of interpretation
on [DATE] at [DATE] to provider Dr. MOATSHE by Dr. MOATSHE
MOATSHE.

## 2021-02-07 MED ORDER — SODIUM CHLORIDE 0.9 % IV SOLN: INTRAVENOUS | Status: AC

## 2021-02-07 MED ORDER — STROKE: EARLY STAGES OF RECOVERY BOOK
Freq: Once | Status: AC
  Filled 2021-02-07: qty 1

## 2021-02-07 MED ORDER — CLEVIDIPINE BUTYRATE 0.5 MG/ML IV EMUL
INTRAVENOUS | Status: AC
  Filled 2021-02-07: qty 50

## 2021-02-07 MED ORDER — FAMOTIDINE 20 MG PO TABS
40.0000 mg | ORAL_TABLET | Freq: Every day | ORAL | Status: DC
  Administered 2021-02-07 – 2021-02-09 (×3): 40 mg via ORAL
  Filled 2021-02-07 (×3): qty 2

## 2021-02-07 MED ORDER — BUSPIRONE HCL 10 MG PO TABS
5.0000 mg | ORAL_TABLET | Freq: Two times a day (BID) | ORAL | Status: DC
  Administered 2021-02-07 – 2021-02-10 (×6): 5 mg via ORAL
  Filled 2021-02-07 (×6): qty 1

## 2021-02-07 MED ORDER — PANTOPRAZOLE SODIUM 40 MG IV SOLR
40.0000 mg | Freq: Every day | INTRAVENOUS | Status: DC
  Administered 2021-02-07 – 2021-02-09 (×3): 40 mg via INTRAVENOUS
  Filled 2021-02-07 (×3): qty 40

## 2021-02-07 MED ORDER — LEVOTHYROXINE SODIUM 75 MCG PO TABS
75.0000 ug | ORAL_TABLET | Freq: Every day | ORAL | Status: DC
  Administered 2021-02-08 – 2021-02-10 (×3): 75 ug via ORAL
  Filled 2021-02-07 (×3): qty 1

## 2021-02-07 MED ORDER — TENECTEPLASE FOR STROKE
0.2500 mg/kg | PACK | Freq: Once | INTRAVENOUS | Status: AC
  Administered 2021-02-07: 20:00:00 23 mg via INTRAVENOUS
  Filled 2021-02-07: qty 10

## 2021-02-07 MED ORDER — MOMETASONE FURO-FORMOTEROL FUM 200-5 MCG/ACT IN AERO
2.0000 | INHALATION_SPRAY | Freq: Two times a day (BID) | RESPIRATORY_TRACT | Status: DC
  Administered 2021-02-08 – 2021-02-10 (×6): 2 via RESPIRATORY_TRACT
  Filled 2021-02-07: qty 8.8

## 2021-02-07 MED ORDER — ACETAMINOPHEN 160 MG/5ML PO SOLN: 650.0000 mg | ORAL | Status: DC | PRN

## 2021-02-07 MED ORDER — LABETALOL HCL 5 MG/ML IV SOLN
INTRAVENOUS | Status: AC
  Filled 2021-02-07: qty 4

## 2021-02-07 MED ORDER — INSULIN ASPART 100 UNIT/ML IJ SOLN
0.0000 [IU] | Freq: Three times a day (TID) | INTRAMUSCULAR | Status: DC
  Administered 2021-02-08: 10:00:00 3 [IU] via SUBCUTANEOUS
  Administered 2021-02-08: 12:00:00 2 [IU] via SUBCUTANEOUS
  Administered 2021-02-08: 18:00:00 7 [IU] via SUBCUTANEOUS
  Administered 2021-02-09 (×2): 3 [IU] via SUBCUTANEOUS
  Administered 2021-02-09 – 2021-02-10 (×3): 2 [IU] via SUBCUTANEOUS

## 2021-02-07 MED ORDER — SERTRALINE HCL 50 MG PO TABS
50.0000 mg | ORAL_TABLET | Freq: Every day | ORAL | Status: DC
  Administered 2021-02-07 – 2021-02-09 (×3): 50 mg via ORAL
  Filled 2021-02-07 (×3): qty 1

## 2021-02-07 MED ORDER — SERTRALINE HCL 50 MG PO TABS: 50.0000 mg | ORAL_TABLET | ORAL | Status: DC

## 2021-02-07 MED ORDER — SIMVASTATIN 20 MG PO TABS
40.0000 mg | ORAL_TABLET | Freq: Every day | ORAL | Status: DC
  Administered 2021-02-07: 40 mg via ORAL
  Filled 2021-02-07: qty 2

## 2021-02-07 MED ORDER — SERTRALINE HCL 100 MG PO TABS
100.0000 mg | ORAL_TABLET | Freq: Every day | ORAL | Status: DC
  Administered 2021-02-08 – 2021-02-10 (×3): 100 mg via ORAL
  Filled 2021-02-07 (×3): qty 1

## 2021-02-07 MED ORDER — DIVALPROEX SODIUM ER 500 MG PO TB24
500.0000 mg | ORAL_TABLET | Freq: Every day | ORAL | Status: DC
  Administered 2021-02-07 – 2021-02-09 (×3): 500 mg via ORAL
  Filled 2021-02-07 (×5): qty 1

## 2021-02-07 MED ORDER — GADOBUTROL 1 MMOL/ML IV SOLN
7.0000 mL | Freq: Once | INTRAVENOUS | Status: AC | PRN
  Administered 2021-02-07: 21:00:00 7 mL via INTRAVENOUS

## 2021-02-07 MED ORDER — AMITRIPTYLINE HCL 25 MG PO TABS
25.0000 mg | ORAL_TABLET | Freq: Every day | ORAL | Status: DC
  Administered 2021-02-07 – 2021-02-09 (×3): 25 mg via ORAL
  Filled 2021-02-07 (×5): qty 1

## 2021-02-07 MED ORDER — SODIUM CHLORIDE 0.9% FLUSH
3.0000 mL | Freq: Once | INTRAVENOUS | Status: AC
  Administered 2021-02-07: 20:00:00 3 mL via INTRAVENOUS

## 2021-02-07 MED ORDER — ACETAMINOPHEN 325 MG PO TABS: 650.0000 mg | ORAL_TABLET | ORAL | Status: DC | PRN

## 2021-02-07 MED ORDER — ALBUTEROL SULFATE (2.5 MG/3ML) 0.083% IN NEBU: 3.0000 mL | INHALATION_SOLUTION | Freq: Four times a day (QID) | RESPIRATORY_TRACT | Status: DC | PRN

## 2021-02-07 MED ORDER — SENNOSIDES-DOCUSATE SODIUM 8.6-50 MG PO TABS: 1.0000 | ORAL_TABLET | Freq: Every evening | ORAL | Status: DC | PRN

## 2021-02-07 MED ORDER — LORAZEPAM 2 MG/ML IJ SOLN: 1.0000 mg | Freq: Once | INTRAMUSCULAR | Status: DC

## 2021-02-07 MED ORDER — BUTALBITAL-APAP-CAFFEINE 50-325-40 MG PO TABS
1.0000 | ORAL_TABLET | Freq: Four times a day (QID) | ORAL | Status: DC | PRN
  Administered 2021-02-09: 04:00:00 1 via ORAL
  Filled 2021-02-07: qty 1

## 2021-02-07 MED ORDER — ACETAMINOPHEN 650 MG RE SUPP: 650.0000 mg | RECTAL | Status: DC | PRN

## 2021-02-07 MED ORDER — GABAPENTIN 300 MG PO CAPS
300.0000 mg | ORAL_CAPSULE | Freq: Two times a day (BID) | ORAL | Status: DC
  Administered 2021-02-07 – 2021-02-10 (×6): 300 mg via ORAL
  Filled 2021-02-07 (×6): qty 1

## 2021-02-07 NOTE — ED Triage Notes (Signed)
Pt bib EMS from home for L-sided weakness, L-sided facial droop, and L -sided numbness. No prior stroke hx. LKW 1600, pt was attempting to exit her car when she fell while exiting, and then fell off her toilet. Husband called EMS.  EMS vitals 180/90 HR 112 CBG 404  18G IV in R posterior forearm & L anterior forearm.

## 2021-02-07 NOTE — ED Provider Notes (Signed)
[facility] EMERGENCY DEPARTMENT Provider Note   CSN: [id number] Arrival date & time: 02/07/21  2003     History No chief complaint on file.   Lindsay Rice is a 70 y.o. female.  The history is provided by the patient and medical records. No language interpreter was used.  Cerebrovascular Accident This is a new problem. The current episode started 3 to 5 hours ago. The problem occurs constantly. The problem has not changed since onset.Pertinent negatives include no chest pain, no abdominal pain, no headaches and no shortness of breath. Nothing aggravates the symptoms. Nothing relieves the symptoms. She has tried nothing for the symptoms. The treatment provided no relief.      No past medical history on file.  There are no problems to display for this patient.    OB History   No obstetric history on file.     No family history on file.     Home Medications Prior to Admission medications   Not on File    Allergies    Patient has no allergy information on record.  Review of Systems   Review of Systems  Constitutional:  Negative for chills, fatigue and fever.  HENT:  Negative for congestion.   Eyes:  Negative for visual disturbance.  Respiratory:  Negative for cough, chest tightness and shortness of breath.   Cardiovascular:  Negative for chest pain.  Gastrointestinal:  Negative for abdominal pain, constipation, diarrhea, nausea and vomiting.  Genitourinary:  Negative for dysuria and flank pain.  Musculoskeletal:  Negative for back pain and neck pain.  Skin:  Negative for rash.  Neurological:  Positive for facial asymmetry, weakness and numbness. Negative for light-headedness and headaches.  Psychiatric/Behavioral:  Negative for agitation.   All other systems reviewed and are negative.  Physical Exam Updated Vital Signs BP (!) 165/85 (BP Location: Right Arm)    Pulse (!) 111    Resp 16    Wt 90.8 kg    SpO2 97%   Physical Exam Vitals and  nursing note reviewed.  Constitutional:      General: She is not in acute distress.    Appearance: She is well-developed. She is not ill-appearing, toxic-appearing or diaphoretic.  HENT:     Head: Normocephalic and atraumatic.     Nose: No congestion or rhinorrhea.     Mouth/Throat:     Mouth: Mucous membranes are moist.     Pharynx: No oropharyngeal exudate or posterior oropharyngeal erythema.  Eyes:     General: No visual field deficit.    Extraocular Movements: Extraocular movements intact.     Conjunctiva/sclera: Conjunctivae normal.     Pupils: Pupils are equal, round, and reactive to light.  Cardiovascular:     Rate and Rhythm: Normal rate and regular rhythm.     Heart sounds: No murmur heard. Pulmonary:     Effort: Pulmonary effort is normal. No respiratory distress.     Breath sounds: Normal breath sounds.  Abdominal:     Palpations: Abdomen is soft.     Tenderness: There is no abdominal tenderness. There is no guarding or rebound.  Musculoskeletal:        General: No swelling or tenderness.     Cervical back: Neck supple.  Skin:    General: Skin is warm and dry.     Capillary Refill: Capillary refill takes less than 2 seconds.     Findings: No erythema.  Neurological:     Mental Status: She is alert.  Cranial Nerves: Facial asymmetry present.     Sensory: Sensory deficit present.     Motor: Weakness and abnormal muscle tone present.     Comments: Weakness of left face, left arm, and left leg numbness and left face, left arm, and left leg.  Equal symmetric and reactive with normal extraocular movements.  Clear speech for me.  Psychiatric:        Mood and Affect: Mood normal.    ED Results / Procedures / Treatments   Labs (all labs ordered are listed, but only abnormal results are displayed) Labs Reviewed  CBC - Abnormal; Notable for the following components:      Result Value   WBC 10.6 (*)    All other components within normal limits  DIFFERENTIAL -  Abnormal; Notable for the following components:   Neutro Abs 7.8 (*)    All other components within normal limits  COMPREHENSIVE METABOLIC PANEL - Abnormal; Notable for the following components:   Glucose, Bld 373 (*)    Creatinine, Ser 1.01 (*)    All other components within normal limits  I-STAT CHEM 8, ED - Abnormal; Notable for the following components:   Glucose, Bld 382 (*)    Calcium, Ion 1.04 (*)    All other components within normal limits  CBG MONITORING, ED - Abnormal; Notable for the following components:   Glucose-Capillary 408 (*)    All other components within normal limits  PROTIME-INR  APTT  HIV ANTIBODY (ROUTINE TESTING W REFLEX)  HEMOGLOBIN A1C  LIPID PANEL    EKG EKG Interpretation  Date/Time:  Tuesday February 07 2021 21:38:05 EST Ventricular Rate:  110 PR Interval:  150 QRS Duration: 82 QT Interval:  322 QTC Calculation: 436 R Axis:   18 Text Interpretation: Sinus tachycardia Abnormal R-wave progression, early transition No prior ECG for comparison. no STEMI Confirmed by Theda Belfast ([id number]) on 02/07/2021 11:25:22 PM  Radiology CT HEAD CODE STROKE WO CONTRAST  Result Date: 02/07/2021 CLINICAL DATA:  Code stroke. Initial evaluation for acute stroke. Right-sided facial droop. EXAM: CT HEAD WITHOUT CONTRAST TECHNIQUE: Contiguous axial images were obtained from the base of the skull through the vertex without intravenous contrast. COMPARISON:  CT from 04/22/2015. FINDINGS: Brain: Cerebral volume within normal limits. No acute intracranial hemorrhage. No acute large vessel territory infarct. No mass lesion, midline shift or mass effect. No hydrocephalus or extra-axial fluid collection. Vascular: No hyperdense vessel. Skull: Scalp soft tissues and calvarium within normal limits. Sinuses/Orbits: Globes and orbital soft tissues within normal limits. Paranasal sinuses and mastoid air cells are clear. Other: None. ASPECTS [facility] Stroke Program Early CT Score) -  Ganglionic level infarction (caudate, lentiform nuclei, internal capsule, insula, M1-M3 cortex): 7 - Supraganglionic infarction (M4-M6 cortex): 3 Total score (0-10 with 10 being normal): 10 IMPRESSION: 1. No acute intracranial abnormality. 2. ASPECTS is 10. Results were communicated by telephone at the time of interpretation on 02/07/2021 at 8:22 pm to provider Dr. Derry Lory by Dr. Whitman Hero. Electronically Signed   By: Rise Mu M.D.   On: 02/07/2021 20:26    Procedures Procedures   CRITICAL CARE Performed by: Canary Brim Viaan Knippenberg Total critical care time: 30 minutes Critical care time was exclusive of separately billable procedures and treating other patients. Critical care was necessary to treat or prevent imminent or life-threatening deterioration. Critical care was time spent personally by me on the following activities: development of treatment plan with patient and/or surrogate as well as nursing, discussions with consultants, evaluation of patient's  response to treatment, examination of patient, obtaining history from patient or surrogate, ordering and performing treatments and interventions, ordering and review of laboratory studies, ordering and review of radiographic studies, pulse oximetry and re-evaluation of patient's condition.   Medications Ordered in ED Medications  tenecteplase (TNKASE) injection for Stroke 23 mg (has no administration in time range)  LORazepam (ATIVAN) injection 1 mg (0 mg Intravenous Hold 02/07/21 2111)   stroke: mapping our early stages of recovery book (has no administration in time range)  0.9 %  sodium chloride infusion (has no administration in time range)  acetaminophen (TYLENOL) tablet 650 mg (has no administration in time range)    Or  acetaminophen (TYLENOL) 160 MG/5ML solution 650 mg (has no administration in time range)    Or  acetaminophen (TYLENOL) suppository 650 mg (has no administration in time range)  senna-docusate  (Senokot-S) tablet 1 tablet (has no administration in time range)  pantoprazole (PROTONIX) injection 40 mg (has no administration in time range)  albuterol (PROVENTIL) (2.5 MG/3ML) 0.083% nebulizer solution 3 mL (has no administration in time range)  amitriptyline (ELAVIL) tablet 25 mg (has no administration in time range)  busPIRone (BUSPAR) tablet 5 mg (has no administration in time range)  butalbital-acetaminophen-caffeine (FIORICET) 50-325-40 MG per tablet 1 tablet (has no administration in time range)  divalproex (DEPAKOTE ER) 24 hr tablet 500 mg (has no administration in time range)  famotidine (PEPCID) tablet 40 mg (has no administration in time range)  mometasone-formoterol (DULERA) 200-5 MCG/ACT inhaler 2 puff (has no administration in time range)  gabapentin (NEURONTIN) capsule 300 mg (has no administration in time range)  levothyroxine (SYNTHROID) tablet 75 mcg (has no administration in time range)  simvastatin (ZOCOR) tablet 40 mg (has no administration in time range)  sertraline (ZOLOFT) tablet 100 mg (has no administration in time range)    And  sertraline (ZOLOFT) tablet 50 mg (has no administration in time range)  insulin aspart (novoLOG) injection 0-9 Units (has no administration in time range)  sodium chloride flush (NS) 0.9 % injection 3 mL (3 mLs Intravenous Given 02/07/21 2015)  gadobutrol (GADAVIST) 1 MMOL/ML injection 7 mL (7 mLs Intravenous Contrast Given 02/07/21 2126)    ED Course  I have reviewed the triage vital signs and the nursing notes.  Pertinent labs & imaging results that were available during my care of the patient were reviewed by me and considered in my medical decision making (see chart for details).    MDM Rules/Calculators/A&P                         Desirae Mancusi is a 70 y.o. female with unknown past medical history who presents as a code stroke.  According to EMS, at 4 PM, patient started having left-sided deficits with left face, left arm, and  left leg being weak.  She reportedly had a a fall and caught herself and then had a fall off the toilet as well the is left side is being weak.  She denies any vision changes and reports her chest was feeling some discomfort when she was very anxious.  She otherwise does not report any recent fevers, chills, infectious symptoms, congestion, cough, shortness of breath, or other complaints.  She also is feeling numbness in the entire left body and left face, left arm, left leg.  Per EMS, glucose was elevated at just over 400 and she has been having some difficulties with her glucoses.  They are reportedly  making some changes in her glucose management plan.  On arrival, airways intact.  Breath sounds equal bilaterally.  Patient cleared for CT scanner.  Patient evaluated by neurology in the CT scanner.  Await their recommendations.  On my exam, patient does have weakness in the left arm, left face, and left leg.  She also has numbness in left arm, left face, and left leg.  She was unable to do finger-nose-finger testing due to the weakness.  Pupils are symmetric and reactive with normal extraocular movements on my exam.  Speech was clear.  Otherwise, lungs clear and chest had some slight tenderness.  Exam otherwise unremarkable.  Anticipate recommendations by neurology.    Neurology ordered tenecteplase.  They will admit for further management of stroke.   Final Clinical Impression(s) / ED Diagnoses Final diagnoses:  Cerebrovascular accident (CVA), unspecified mechanism (HCC)  Left-sided weakness  Left sided numbness   Clinical Impression: 1. Cerebrovascular accident (CVA), unspecified mechanism (HCC)   2. Left-sided weakness   3. Left sided numbness     Disposition: Admit  This note was prepared with assistance of Dragon voice recognition software. Occasional wrong-word or sound-a-like substitutions may have occurred due to the inherent limitations of voice recognition software.        Mayela Bullard, Canary Brim, MD 02/07/21 [phone]

## 2021-02-07 NOTE — H&P (Addendum)
NEUROLOGY CONSULTATION NOTE   Date of service: February 07, 2021 Patient Name: Lindsay Rice MRN:  [id number] DOB:  11-02-1950 Reason for consult: "Stroke code" Requesting Provider: Tegeler, Gwenyth Allegra, * _ _ _   _ __   _ __ _ _  __ __   _ __   __ _  History of Present Illness  Lindsay Rice is a 70 y.o. female with PMH significant for migraines on ajovy, history of DM2, HTN, HLD, mitral valve prolapse, OSA who presents with acute onset left sided weakness.  She was attempting to get out of her car when she fell due to left leg weakness. She got to her home and was in the toilet and then fell again and had trouble getting up due to left arm weakness. Husband helped her and then called EMS.  No prior history of strokes, no recent surgeries, did not hit her head today. Endorses mild chest tenderness to palpation, no tearing chest pain going into her back and no history of aortic dilatation or dissection.  mRS:0 LKW: 1600 tNKASE: discussed risks and benefits and she was offered tNKASE. Thrombectomy: not offered, low concern for LVO. NIHSS components Score: Comment  1a Level of Conscious 0[x]  1[]  2[]  3[]      1b LOC Questions 0[x]  1[]  2[]       1c LOC Commands 0[x]  1[]  2[]       2 Best Gaze 0[x]  1[]  2[]       3 Visual 0[x]  1[]  2[]  3[]      4 Facial Palsy 0[]  1[x]  2[]  3[]      5a Motor Arm - left 0[]  1[x]  2[]  3[]  4[]  UN[]    5b Motor Arm - Right 0[x]  1[]  2[]  3[]  4[]  UN[]    6a Motor Leg - Left 0[]  1[]  2[x]  3[]  4[]  UN[]    6b Motor Leg - Right 0[x]  1[]  2[]  3[]  4[]  UN[]    7 Limb Ataxia 0[]  1[x]  2[]  3[]  UN[]     8 Sensory 0[]  1[x]  2[]  UN[]      9 Best Language 0[x]  1[]  2[]  3[]      10 Dysarthria 0[]  1[x]  2[]  UN[]      11 Extinct. and Inattention 0[x]  1[]  2[]       TOTAL: 7     ROS   Constitutional Denies weight loss, fever and chills.   HEENT Denies changes in vision and hearing.   Respiratory Denies SOB and cough.   CV Denies palpitations and CP   GI Denies abdominal pain, nausea, vomiting  and diarrhea.   GU Denies dysuria and urinary frequency.   MSK Denies myalgia and joint pain.   Skin Denies rash and pruritus.   Neurological Denies headache and syncope.   Psychiatric Denies recent changes in mood. Denies anxiety and depression.    Past History  No past medical history on file.  No family history on file. Social History   Socioeconomic History   Marital status: Married    Spouse name: Not on file   Number of children: Not on file   Years of education: Not on file   Highest education level: Not on file  Occupational History   Not on file  Tobacco Use   Smoking status: Not on file   Smokeless tobacco: Not on file  Substance and Sexual Activity   Alcohol use: Not on file   Drug use: Not on file   Sexual activity: Not on file  Other Topics Concern   Not on file  Social History Narrative   Not on  file   Social Determinants of Health   Financial Resource Strain: Not on file  Food Insecurity: Not on file  Transportation Needs: Not on file  Physical Activity: Not on file  Stress: Not on file  Social Connections: Not on file   Not on File  Medications  (Not in a hospital admission)    Vitals   Vitals:   02/07/21 2000  Weight: 90.8 kg     There is no height or weight on file to calculate BMI.  Physical Exam   General: Laying comfortably in bed; in no acute distress.  HENT: Normal oropharynx and mucosa. Normal external appearance of ears and nose.  Neck: Supple, no pain or tenderness  CV: No JVD. No peripheral edema.  Pulmonary: Symmetric Chest rise. Normal respiratory effort.  Abdomen: Soft to touch, non-tender.  Ext: No cyanosis, edema, or deformity  Skin: No rash. Normal palpation of skin.   Musculoskeletal: Normal digits and nails by inspection. No clubbing.   Neurologic Examination  Mental status/Cognition: Alert, oriented to self, place, month and year, good attention.  Speech/language: Fluent, comprehension intact, object naming  intact, repetition intact.  Cranial nerves:   CN II Pupils equal and reactive to light, no VF deficits   CN III,IV,VI EOM intact, no gaze preference or deviation, no nystagmus    CN V normal sensation in V1, V2, and V3 segments bilaterally    CN VII L facial droop   CN VIII normal hearing to speech    CN IX & X normal palatal elevation, no uvular deviation    CN XI 5/5 head turn and 5/5 shoulder shrug bilaterally   CN XII midline tongue protrusion   Motor:  Muscle bulk: normal, tone normal, pronator drift yes LUE drift. tremor None Mvmt Root Nerve  Muscle Right Left Comments  SA C5/6 Ax Deltoid 5 2   EF C5/6 Mc Biceps 5 3   EE C6/7/8 Rad Triceps 5 3   WF C6/7 Med FCR     WE C7/8 PIN ECU     F Ab C8/T1 U ADM/FDI 5 3   HF L1/2/3 Fem Illopsoas 5 3   KE L2/3/4 Fem Quad 5 2   DF L4/5 D Peron Tib Ant 5 2   PF S1/2 Tibial Grc/Sol 5 2    Reflexes:  Right Left Comments  Pectoralis      Biceps (C5/6) 2 2   Brachioradialis (C5/6) 2 2    Triceps (C6/7) 2 2    Patellar (L3/4) 2 2    Achilles (S1)      Hoffman      Plantar     Jaw jerk    Sensation:  Light touch Decreased in LUE and LLE.   Pin prick    Temperature    Vibration   Proprioception    Coordination/Complex Motor:  - Finger to Nose intact on the right, unable to do with LUE - Heel to shin unable to do - Rapid alternating movement unable to do with LUE - Gait: unsafe to assess,  Labs   CBC:  Recent Labs  Lab 02/07/21 2022 02/07/21 2029  WBC 10.6*  --   NEUTROABS 7.8*  --   HGB 12.1 12.2  HCT 36.8 36.0  MCV 83.3  --   PLT 261  --     Basic Metabolic Panel:  Lab Results  Component Value Date   NA 135 02/07/2021   K 4.1 02/07/2021   GLUCOSE 382 (H) 02/07/2021  BUN 18 02/07/2021   CREATININE 0.80 02/07/2021   Lipid Panel: No results found for: LDLCALC HgbA1c: No results found for: HGBA1C Urine Drug Screen: No results found for: LABOPIA, COCAINSCRNUR, LABBENZ, AMPHETMU, THCU, LABBARB  Alcohol Level  No results found for: ETH  CT Head without contrast(Personally reviewed): CTH was negative for a large hypodensity concerning for a large territory infarct or hyperdensity concerning for an ICH  MR Angio head without contrast and Carotid Duplex BL(Personally reviewed): No obvious LVO on my review.  MRI Brain(Personally reviewed): R BG stroke. Impression   Lindsay Rice is a 70 y.o. female with PMH significant for migraines on ajovy, history of DM2, HTN, HLD, mitral valve prolapse, OSA who presents with acute onset left sided weakness. Found to have a Right Basal Ganglia stroke. Her neurologic examination is notable for L facial droop + LUE and LLE weakness and numbness.  Primary Diagnosis:  Other cerebral infarction due to occlusion of stenosis of small artery.  Secondary Diagnosis: Essential (primary) hypertension and Type 2 diabetes mellitus with hyperglycemia   Recommendations   Right Basal Ganglia stroke s/p tNKAse - Frequent NeuroChecks for post tPA care per stroke unit protocol: - Initial CTH demonstrated no acute hemorrhage or mass - MRI Brain - R BG stroke. - MRA - no LVO - TTE - pending - Lipid Panel: LDL - pending.  - Statin: continue home statin - HbA1c: pending. - Antithrombotic: Start ASA 81 mg daily if 24 h CTH does not show acute hemorrhage - DVT prophylaxis: SCDs. Pharmacologic prophylaxis if 24 h CTH does not demonstrate acute hemorrhage - Systolic Blood Pressure goal: < 180 mm Hg - Telemetry monitoring for arrhythmia: 72 hours - Swallow screen - ordered - PT/OT/SLP consults - NIHSS of 3 at 2 hours post tNKASE mark. Will use low intensity optimist monitoring.  Diabetes: Reports her last hemoglobin A1c was 7.1. Will do sliding scale insulin with carb correction.  Hyperlipidemia: LDL is pending.  We will continue home statin for now.   Hypertension: We will hold home antihypertensives for now.  Will use as needed given he is post  TNKase.  GERD: Continue home famotidine.  Migraines: Will use home Fioricet as needed. Will discontinue home sumatriptan as this is contraindicated in patients with stroke. Continue home Ajovy injection subcu every 30 days.  Asthma: Continue home inhalers.  Hypothyroidism: Continue home Synthroid.  ______________________________________________________________________  This patient is critically ill and at significant risk of neurological worsening, death and care requires constant monitoring of vital signs, hemodynamics,respiratory and cardiac monitoring, neurological assessment, discussion with family, other specialists and medical decision making of high complexity. I spent 60 minutes of neurocritical care time  in the care of  this patient. This was time spent independent of any time provided by nurse practitioner or PA.  Erick Blinks [facility] Pager Number [phone] 02/07/2021  11:20 PM   Thank you for the opportunity to take part in the care of this patient. If you have any further questions, please contact the neurology consultation attending.  Signed,  Erick Blinks [facility] Pager Number [phone] _ _ _   _ __   _ __ _ _  __ __   _ __   __ _

## 2021-02-07 NOTE — ED Notes (Signed)
Unable to perform post-TNK neurological assessment due to pt being in MRI scanner at this time.

## 2021-02-07 NOTE — Progress Notes (Signed)
PHARMACIST CODE STROKE RESPONSE  Notified to mix TNK at 2019 by Dr. Derry Lory Delivered TNK to RN at 2022  TNK dose = 23 mg IV over 5 seconds.   Issues/delays encountered (if applicable): None   Vinnie Level, PharmD., BCPS, BCCCP Clinical Pharmacist Please refer to [facility] for unit-specific pharmacist

## 2021-02-07 NOTE — ED Notes (Signed)
Patient transported to MRI 

## 2021-02-08 ENCOUNTER — Other Ambulatory Visit (HOSPITAL_COMMUNITY): Payer: Self-pay

## 2021-02-08 ENCOUNTER — Inpatient Hospital Stay (HOSPITAL_COMMUNITY): Payer: Medicare Other

## 2021-02-08 DIAGNOSIS — I6389 Other cerebral infarction: Secondary | ICD-10-CM

## 2021-02-08 LAB — CBC
HCT: 36.1 % (ref 36.0–46.0)
Hemoglobin: 11.9 g/dL — ABNORMAL LOW (ref 12.0–15.0)
MCH: 28 pg (ref 26.0–34.0)
MCHC: 33 g/dL (ref 30.0–36.0)
MCV: 84.9 fL (ref 80.0–100.0)
Platelets: 263 10*3/uL (ref 150–400)
RBC: 4.25 MIL/uL (ref 3.87–5.11)
RDW: 13.5 % (ref 11.5–15.5)
WBC: 10.7 10*3/uL — ABNORMAL HIGH (ref 4.0–10.5)
nRBC: 0 % (ref 0.0–0.2)

## 2021-02-08 LAB — ECHOCARDIOGRAM COMPLETE
AR max vel: 2.6 cm2
AV Area VTI: 2.61 cm2
AV Area mean vel: 2.64 cm2
AV Mean grad: 5 mmHg
AV Peak grad: 8.4 mmHg
Ao pk vel: 1.45 m/s
Area-P 1/2: 4.17 cm2
Height: 65 in
S' Lateral: 2.4 cm
Weight: 3202.84 oz

## 2021-02-08 LAB — GLUCOSE, CAPILLARY
Glucose-Capillary: 203 mg/dL — ABNORMAL HIGH (ref 70–99)
Glucose-Capillary: 311 mg/dL — ABNORMAL HIGH (ref 70–99)
Glucose-Capillary: 199 mg/dL — ABNORMAL HIGH (ref 70–99)

## 2021-02-08 LAB — HIV ANTIBODY (ROUTINE TESTING W REFLEX): HIV Screen 4th Generation wRfx: NONREACTIVE

## 2021-02-08 LAB — CBG MONITORING, ED
Glucose-Capillary: 219 mg/dL — ABNORMAL HIGH (ref 70–99)
Glucose-Capillary: 167 mg/dL — ABNORMAL HIGH (ref 70–99)

## 2021-02-08 LAB — LIPID PANEL
Cholesterol: 159 mg/dL (ref 0–200)
HDL: 39 mg/dL — ABNORMAL LOW (ref >40)
LDL Cholesterol: 82 mg/dL (ref 0–99)
Total CHOL/HDL Ratio: 4.1 RATIO
Triglycerides: 189 mg/dL — ABNORMAL HIGH (ref <150)
VLDL: 38 mg/dL (ref 0–40)

## 2021-02-08 LAB — RESP PANEL BY RT-PCR (FLU A&B, COVID) ARPGX2
Influenza A by PCR: NEGATIVE
Influenza B by PCR: NEGATIVE
SARS Coronavirus 2 by RT PCR: NEGATIVE

## 2021-02-08 LAB — CREATININE, SERUM
Creatinine, Ser: 0.93 mg/dL (ref 0.44–1.00)
GFR, Estimated: >60 mL/min (ref >60)

## 2021-02-08 LAB — HEMOGLOBIN A1C
Hgb A1c MFr Bld: 8.3 % — ABNORMAL HIGH (ref 4.8–5.6)
Mean Plasma Glucose: 191.51 mg/dL

## 2021-02-08 IMAGING — CT CT HEAD W/O CM
4 of 5 series · 15 of 47 positions shown, 17 images · non-contrast
Comparison: Plain brain CT and MR head, dated [DATE]

CLINICAL DATA: Neuro deficit, stroke suspected.

EXAM:
CT HEAD WITHOUT CONTRAST
TECHNIQUE: Contiguous axial images were obtained from the base of the skull
through the vertex without intravenous contrast.

[Series 3: head wo · axial · 0.45mm/px · z∈[-130,-40]mm · 4 of 32 slices shown]
[im 7/32  brain]
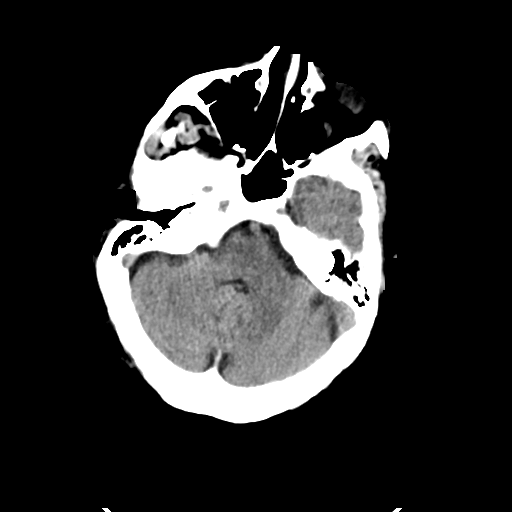
[im 13/32  brain]
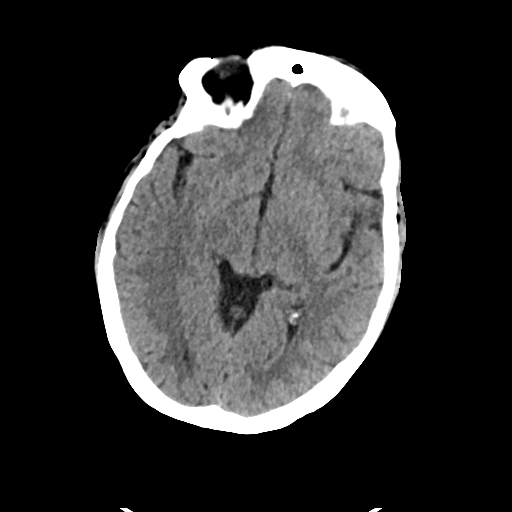
[im 19/32  brain]
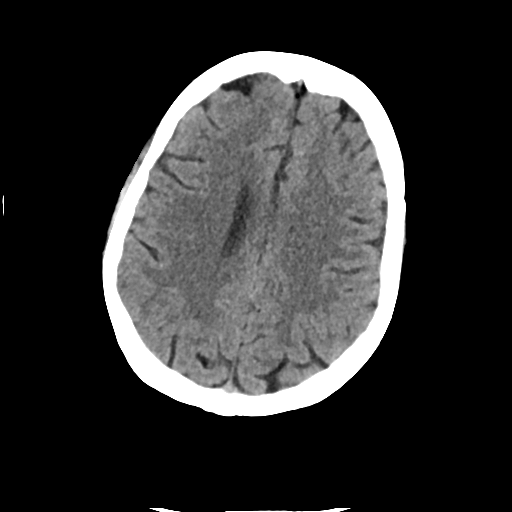
[im 25/32  brain]
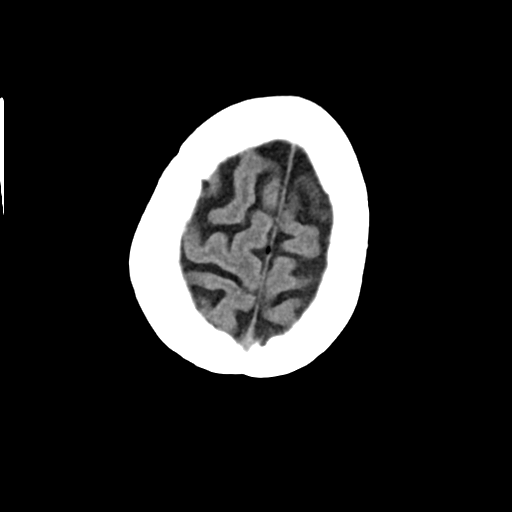

[Series 5: true axial · axial · 0.37mm/px · z∈[-115,-16]mm · 5 of 31 slices shown, 7 images]
[im 6/31  brain]
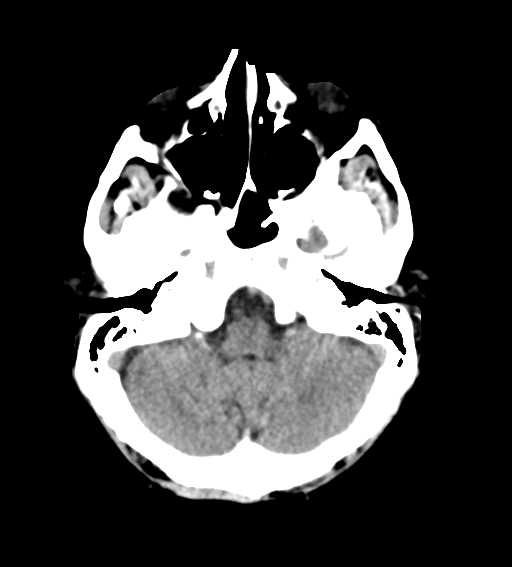
[im 6/31  bone]
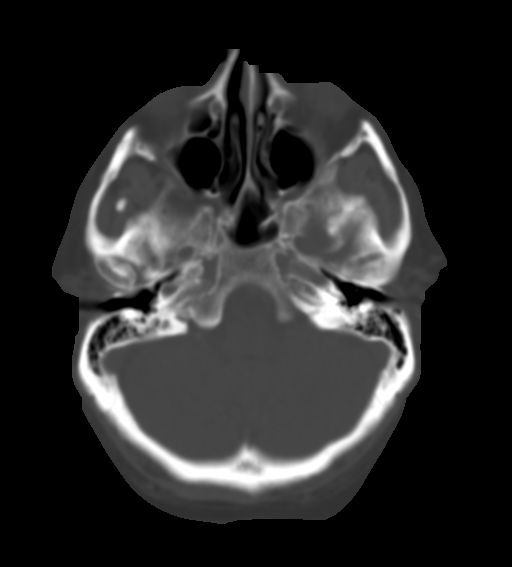
[im 11/31  brain]
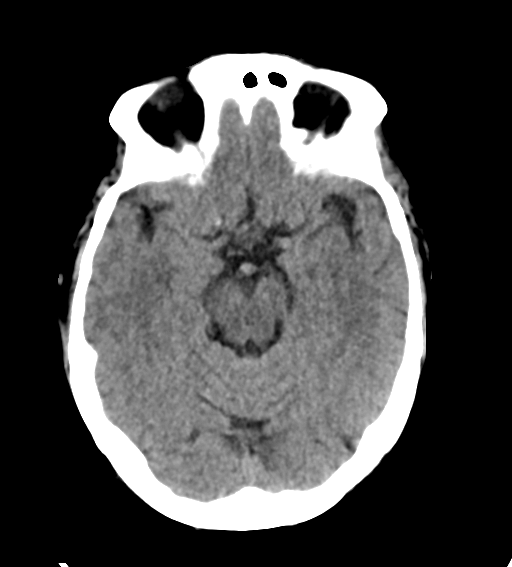
[im 16/31  brain]
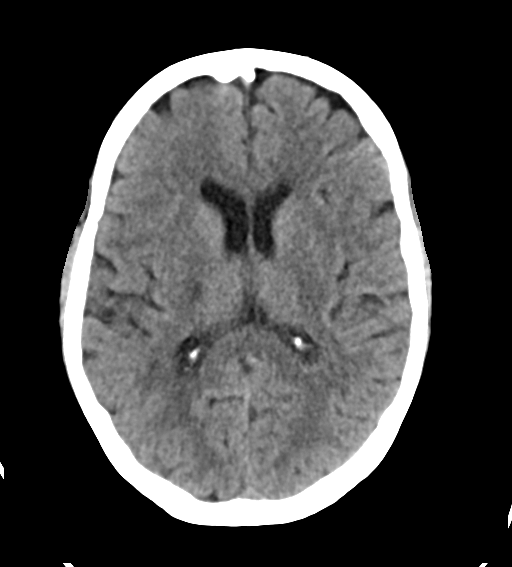
[im 21/31  brain]
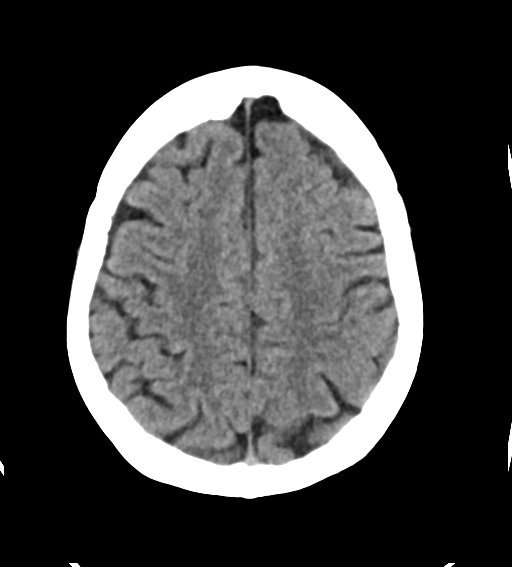
[im 26/31  brain]
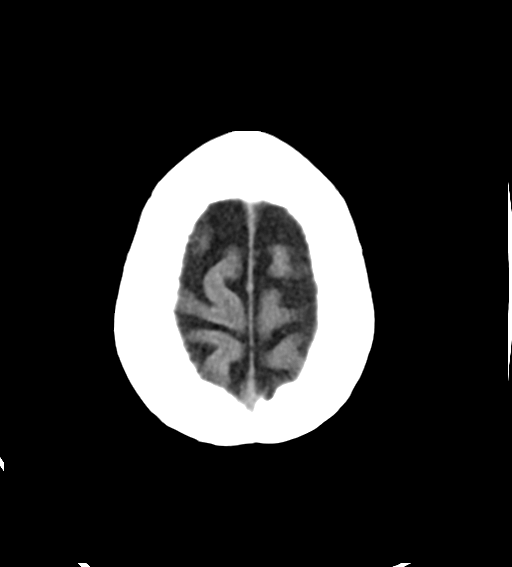
[im 26/31  bone]
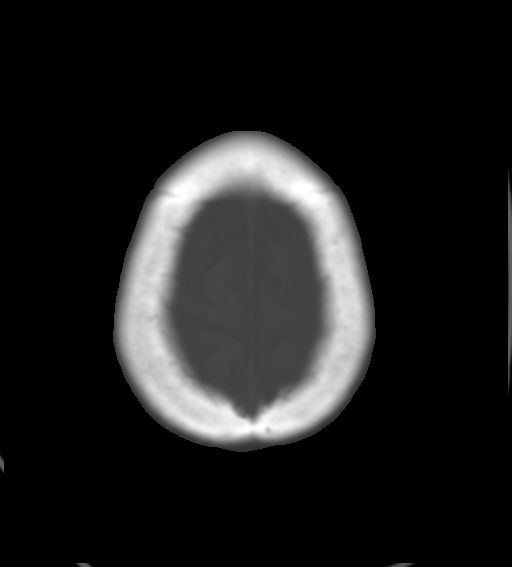

[Series 6: cor soft · coronal · 0.31mm/px · 3 of 69 slices shown]
[im 23/69  brain]
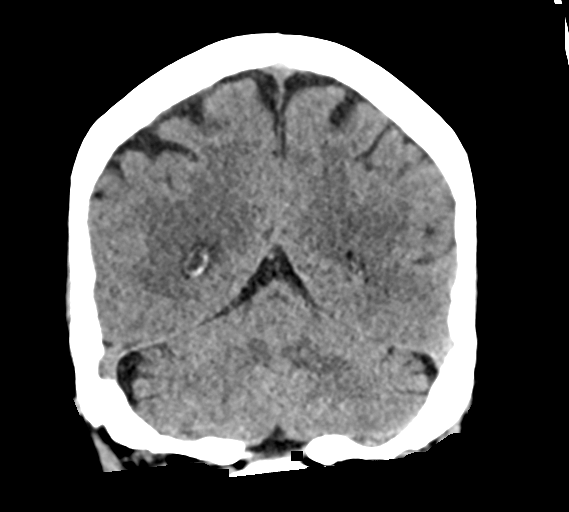
[im 31/69  brain]
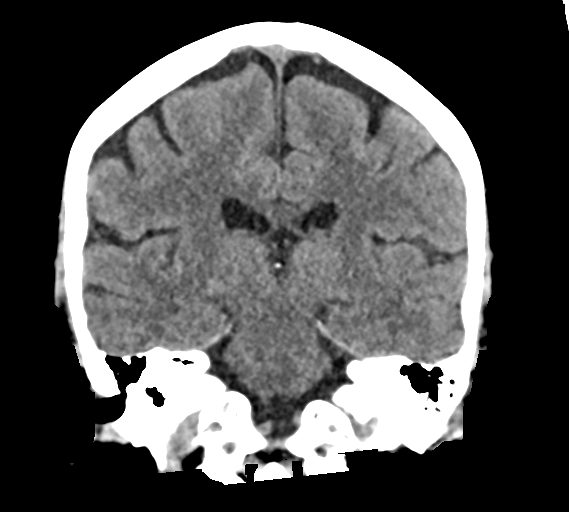
[im 38/69  brain]
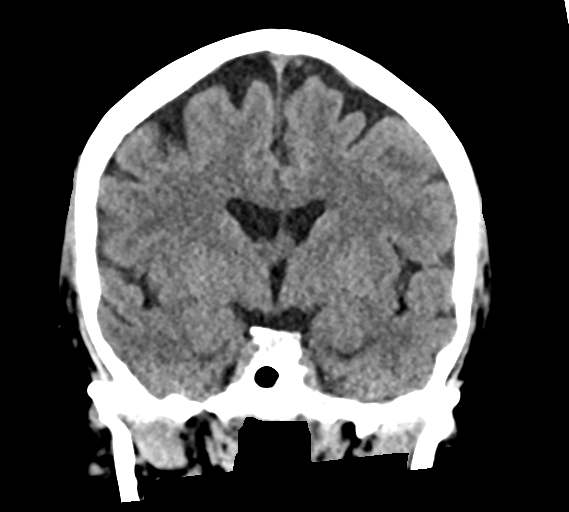

[Series 7: sag soft · sagittal · 0.34mm/px · 3 of 63 slices shown]
[im 22/63  brain]
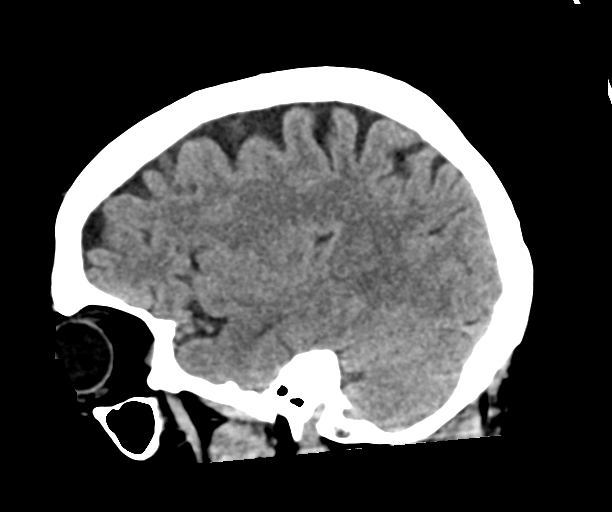
[im 32/63  brain]
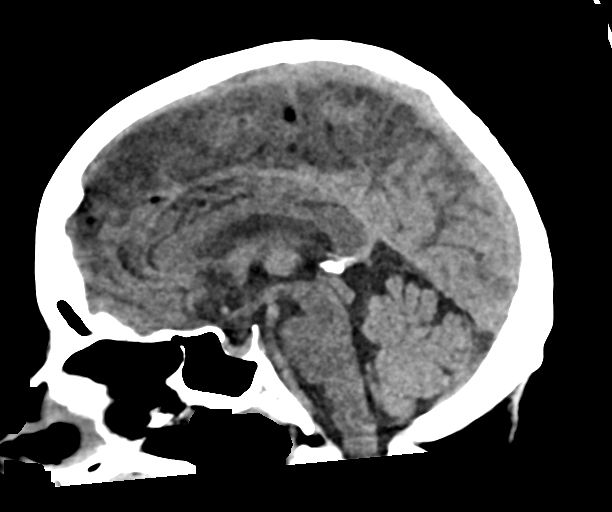
[im 42/63  brain]
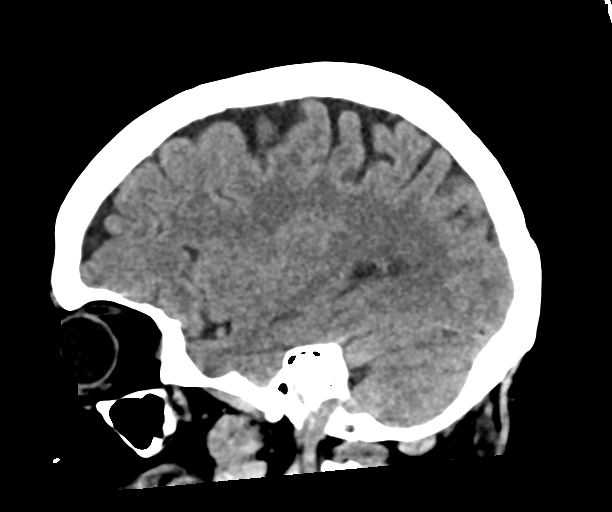

[15 of 47 positions shown; findings below may reference images not displayed]

FINDINGS: Brain: There is mild cerebral atrophy with widening of the
extra-axial spaces and ventricular dilatation.
There are areas of decreased attenuation within the white matter
tracts of the supratentorial brain, consistent with microvascular
disease changes.

A an ill-defined, approximately 8 mm diameter area of white matter
low attenuation is seen along the para thalamic region on the right.
This is slightly more prominent on the current exam when compared to
the prior study and corresponds to findings seen on the prior MRI.
There is no evidence of associated mass effect or midline shift.

Vascular: No hyperdense vessel or unexpected calcification.

Skull: Normal. Negative for fracture or focal lesion.

Sinuses/Orbits: No acute finding.

Other: None.
IMPRESSION: Small, ill-defined subacute infarct along the para thalamic region
on the right. This is slightly more prominent on the current exam
when compared to the prior study and corresponds to findings seen on
the prior MRI.

## 2021-02-08 MED ORDER — ENOXAPARIN SODIUM 40 MG/0.4ML IJ SOSY
40.0000 mg | PREFILLED_SYRINGE | INTRAMUSCULAR | Status: DC
  Administered 2021-02-09 (×2): 40 mg via SUBCUTANEOUS
  Filled 2021-02-08 (×2): qty 0.4

## 2021-02-08 MED ORDER — AMLODIPINE BESYLATE 5 MG PO TABS
5.0000 mg | ORAL_TABLET | Freq: Every day | ORAL | Status: DC
  Administered 2021-02-09 – 2021-02-10 (×2): 5 mg via ORAL
  Filled 2021-02-08 (×2): qty 1

## 2021-02-08 MED ORDER — ASPIRIN 81 MG PO CHEW
81.0000 mg | CHEWABLE_TABLET | Freq: Every day | ORAL | Status: DC
  Administered 2021-02-09 – 2021-02-10 (×2): 81 mg via ORAL
  Filled 2021-02-08 (×2): qty 1

## 2021-02-08 MED ORDER — ATORVASTATIN CALCIUM 40 MG PO TABS
40.0000 mg | ORAL_TABLET | Freq: Every day | ORAL | Status: DC
  Administered 2021-02-08: 22:00:00 40 mg via ORAL
  Filled 2021-02-08: qty 1

## 2021-02-08 MED ORDER — CLOPIDOGREL BISULFATE 75 MG PO TABS
75.0000 mg | ORAL_TABLET | Freq: Every day | ORAL | Status: DC
  Administered 2021-02-09 – 2021-02-10 (×2): 75 mg via ORAL
  Filled 2021-02-08 (×2): qty 1

## 2021-02-08 NOTE — Progress Notes (Signed)
PT Cancellation Note  Patient Details Name: Sudiksha Victor MRN: [id number] DOB: 28-May-1950   Cancelled Treatment:    Reason Eval/Treat Not Completed: Other (comment).  Pt was in transit out of ED when PT last attempted, will try again at another time.   Ivar Drape 02/08/2021, 4:37 PM  Samul Dada, PT PhD Acute Rehab Dept. Number: [facility] [id number] and [facility] [phone]

## 2021-02-08 NOTE — Evaluation (Signed)
Speech Language Pathology Evaluation Patient Details Name: Lindsay Rice MRN: [id number] DOB: [birth year] Today's Date: 02/08/2021 Time: 8469-6295 SLP Time Calculation (min) (ACUTE ONLY): 25 min  Problem List:  Patient Active Problem List   Diagnosis Date Noted   Stroke determined by clinical assessment (HCC) 02/07/2021   HPI:  Lindsay Rice is a 70 y.o. female with PMH significant for migraines on ajovy, history of DM2, HTN, HLD, mitral valve prolapse, OSA who presents with acute onset left sided weakness. Found to have a Right Basal Ganglia stroke. Her neurologic examination is notable for L facial droop + LUE and LLE weakness and numbness; 02/07/21 MRI head revealed 7 mm acute ischemic nonhemorrhagic infarct involving the  posterior limb of the right internal capsule; SLE generated to assess speech/language and cognitive function.   Assessment / Plan / Recommendation Clinical Impression  Pt administered the SLUMS examination ([employer] Mental Status) to assess cognitive function, verbal expression, auditory comprehension paired with clinical observations utilized in conjunction with this assessment for speech intelligibility and above mentioned areas of language.  Pt scored a 26/30 with deficits noted minimally in the areas of attention and memory (paragraph retention-75%).  Pt required min cues for calculations and repeating past 3 digits backwards.  She exhibited speech which was judged to be 85% accurate within simple conversation with low intensity and imprecise articulation.  It should be noted pt's upper dentures were not available during this assessment which may impact overall intelligibility.  L facial weakness noted during OME, but adequate for swallowing as pt passed  [place] swallow screen and denies dysphagic symptoms.  ST will f/u briefly for deficits noted above while in acute setting.  Thank you for this consult.    SLP Assessment  SLP Recommendation/Assessment: Patient  needs continued Speech Language Pathology Services SLP Visit Diagnosis: Cognitive communication deficit (R41.841)    Recommendations for follow up therapy are one component of a multi-disciplinary discharge planning process, led by the attending physician.  Recommendations may be updated based on patient status, additional functional criteria and insurance authorization.    Follow Up Recommendations  Follow physician's recommendations for discharge plan and follow up therapies    Assistance Recommended at Discharge  Frequent or constant Supervision/Assistance (d/t mobility issues)  Functional Status Assessment Patient has had a recent decline in their functional status and demonstrates the ability to make significant improvements in function in a reasonable and predictable amount of time.  Frequency and Duration min 1 x/week  1 week      SLP Evaluation Cognition  Overall Cognitive Status: No family/caregiver present to determine baseline cognitive functioning Arousal/Alertness: Awake/alert Orientation Level: Oriented X4 Year: 2022 Month: December Day of Week: Correct Attention: Sustained Sustained Attention: Impaired Sustained Attention Impairment: Verbal complex;Functional complex Memory: Appears intact Immediate Memory Recall: Sock;Blue;Bed Memory Recall Sock: Without Cue Memory Recall Blue: Without Cue Memory Recall Bed: Without Cue Awareness: Appears intact Problem Solving: Appears intact Safety/Judgment: Appears intact       Comprehension  Auditory Comprehension Overall Auditory Comprehension: Appears within functional limits for tasks assessed Yes/No Questions: Within Functional Limits Commands: Within Functional Limits Conversation: Simple Interfering Components: Attention EffectiveTechniques: Repetition Visual Recognition/Discrimination Discrimination: Within Function Limits Reading Comprehension Reading Status: Within funtional limits    Expression  Expression Primary Mode of Expression: Verbal Verbal Expression Overall Verbal Expression: Appears within functional limits for tasks assessed Initiation: No impairment Level of Generative/Spontaneous Verbalization: Conversation Repetition: No impairment Naming: No impairment Pragmatics: No impairment Non-Verbal Means of Communication: Not  applicable Written Expression Dominant Hand: Right Written Expression: Within Functional Limits   Oral / Motor  Oral Motor/Sensory Function Overall Oral Motor/Sensory Function: Mild impairment Facial ROM: Reduced left Facial Symmetry: Abnormal symmetry left Facial Strength: Within Functional Limits Facial Sensation: Within Functional Limits Lingual ROM: Within Functional Limits Lingual Symmetry: Within Functional Limits Lingual Strength: Within Functional Limits Lingual Sensation: Within Functional Limits Velum: Within Functional Limits Mandible: Within Functional Limits Motor Speech Overall Motor Speech: Appears within functional limits for tasks assessed Respiration: Within functional limits Phonation: Normal Resonance: Within functional limits Articulation: Within functional limitis Intelligibility: Intelligible Motor Planning: Witnin functional limits Motor Speech Errors: Not applicable            Tressie Stalker, M.S., CCC-SLP 02/08/2021, 2:01 PM

## 2021-02-08 NOTE — Progress Notes (Signed)
STROKE TEAM PROGRESS NOTE   INTERVAL HISTORY No family is at the bedside.  Patient was seen in the ED.  She received TNK and is to be started on aspirin and Plavix. Her left side is still weak and her sensation is diminished on the left side.  She still has some residual right-sided weakness   .  MRI scan shows right internal capsule lacunar infarct MR angiogram of brain and neck shows no significant large vessel stenosis or occlusion.  LDL cholesterol 82 mg percent.  Hemoglobin A1c is 8.3   Vitals:   02/08/21 0830 02/08/21 0900 02/08/21 0930 02/08/21 1000  BP: (!) 148/79 (!) 155/78 (!) 123/92 (!) 141/78  Pulse: (!) 116 (!) 117 (!) 112 (!) 109  Resp: 20 15 (!) 26 17  Temp:      TempSrc:      SpO2: 96% 98% 96% 94%  Weight:      Height:       CBC:  Recent Labs  Lab 02/07/21 2022 02/07/21 2029  WBC 10.6*  --   NEUTROABS 7.8*  --   HGB 12.1 12.2  HCT 36.8 36.0  MCV 83.3  --   PLT 261  --    Basic Metabolic Panel:  Recent Labs  Lab 02/07/21 2022 02/07/21 2029  NA 135 135  K 4.2 4.1  CL 100 101  CO2 23  --   GLUCOSE 373* 382*  BUN 17 18  CREATININE 1.01* 0.80  CALCIUM 9.3  --    Lipid Panel:  Recent Labs  Lab 02/08/21 0500  CHOL 159  TRIG 189*  HDL 39*  CHOLHDL 4.1  VLDL 38  LDLCALC 82   HgbA1c:  Recent Labs  Lab 02/08/21 0500  HGBA1C 8.3*   Urine Drug Screen: No results for input(s): LABOPIA, COCAINSCRNUR, LABBENZ, AMPHETMU, THCU, LABBARB in the last 168 hours.  Alcohol Level No results for input(s): ETH in the last 168 hours.  IMAGING past 24 hours MR ANGIO HEAD WO CONTRAST  Result Date: 02/07/2021 CLINICAL DATA:  Initial evaluation for neuro deficit, stroke suspected. EXAM: MRI HEAD WITHOUT CONTRAST MRA HEAD WITHOUT CONTRAST MRA NECK WITHOUT AND WITH CONTRAST TECHNIQUE: Multiplanar, multi-echo pulse sequences of the brain and surrounding structures were acquired without intravenous contrast. Angiographic images of the Circle of Willis were acquired  using MRA technique without intravenous contrast. Angiographic images of the neck were acquired using MRA technique without and with intravenous contrast. Carotid stenosis measurements (when applicable) are obtained utilizing NASCET criteria, using the distal internal carotid diameter as the denominator. CONTRAST:  29mL GADAVIST GADOBUTROL 1 MMOL/ML IV SOLN COMPARISON:  Head CT from earlier the same day. FINDINGS: MRI HEAD FINDINGS Brain: Cerebral volume within normal limits. Minimal chronic microvascular ischemic disease noted involving the pons. 7 mm focus of restricted diffusion seen involving the posterior limb of the right internal capsule, consistent with an acute ischemic infarct (series 5, image 75). No associated hemorrhage or mass effect. No other evidence for acute or subacute ischemia. No other areas of chronic cortical infarction. No evidence for acute or chronic intracranial hemorrhage. No mass lesion or midline shift. No hydrocephalus or extra-axial fluid collection. Pituitary gland suprasellar region normal. Vascular: Major intracranial vascular flow voids are well maintained. Skull and upper cervical spine: Craniocervical junction within normal limits. Bone marrow signal intensity normal. No scalp soft tissue abnormality. Sinuses/Orbits: Patient status post bilateral ocular lens replacement. Paranasal sinuses are largely clear. No significant mastoid effusion. Inner ear structures grossly normal. Other: None.  MRA HEAD FINDINGS Anterior circulation: Examination mildly degraded by motion. Visualized distal cervical segments of the internal carotid arteries are patent with antegrade flow. Petrous, cavernous, and supraclinoid segments patent without stenosis or other abnormality. Eight A1 segments widely patent. Normal anterior communicating artery complex. Anterior cerebral arteries patent without stenosis. No M1 stenosis or occlusion. Normal MCA bifurcations. Distal MCA branches perfused and symmetric.  Posterior circulation: Both V4 segments patent without stenosis. Right vertebral artery dominant. Both PICA patent at their origins. Basilar widely patent to its distal aspect. Superior cerebellar and posterior cerebral arteries widely patent bilaterally. Anatomic variants: None significant.  No aneurysm. MRA NECK FINDINGS Aortic arch: Examination degraded by motion artifact. Visualized aortic arch normal caliber with normal branch pattern. No hemodynamically significant stenosis seen about the origin the great vessels. Right carotid system: Right common and internal carotid arteries widely patent without stenosis, evidence for dissection or occlusion. Left carotid system: Left common and internal carotid arteries widely patent without stenosis, evidence for dissection or occlusion. Vertebral arteries: Both vertebral arteries arise from the subclavian arteries. No proximal subclavian artery stenosis. Both vertebral arteries widely patent without stenosis, evidence for dissection or occlusion. Right vertebral artery dominant. Other: None IMPRESSION: MRI HEAD IMPRESSION: 1. 7 mm acute ischemic nonhemorrhagic infarct involving the posterior limb of the right internal capsule. 2. 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 MRA HEAD IMPRESSION: Normal intracranial MRA. No large vessel occlusion, hemodynamically significant stenosis, or other acute vascular abnormality. MRA NECK IMPRESSION: Normal MRA of the neck. Electronically Signed   By: Rise Mu M.D.   On: 02/07/2021 23:21   MR ANGIO NECK W WO CONTRAST  Result Date: 02/07/2021 CLINICAL DATA:  Initial evaluation for neuro deficit, stroke suspected. EXAM: MRI HEAD WITHOUT CONTRAST MRA HEAD WITHOUT CONTRAST MRA NECK WITHOUT AND WITH CONTRAST TECHNIQUE: Multiplanar, multi-echo pulse sequences of the brain and surrounding structures were acquired without intravenous contrast. Angiographic images of the Circle of Willis were acquired  using MRA technique without intravenous contrast. Angiographic images of the neck were acquired using MRA technique without and with intravenous contrast. Carotid stenosis measurements (when applicable) are obtained utilizing NASCET criteria, using the distal internal carotid diameter as the denominator. CONTRAST:  3mL GADAVIST GADOBUTROL 1 MMOL/ML IV SOLN COMPARISON:  Head CT from earlier the same day. FINDINGS: MRI HEAD FINDINGS Brain: Cerebral volume within normal limits. Minimal chronic microvascular ischemic disease noted involving the pons. 7 mm focus of restricted diffusion seen involving the posterior limb of the right internal capsule, consistent with an acute ischemic infarct (series 5, image 75). No associated hemorrhage or mass effect. No other evidence for acute or subacute ischemia. No other areas of chronic cortical infarction. No evidence for acute or chronic intracranial hemorrhage. No mass lesion or midline shift. No hydrocephalus or extra-axial fluid collection. Pituitary gland suprasellar region normal. Vascular: Major intracranial vascular flow voids are well maintained. Skull and upper cervical spine: Craniocervical junction within normal limits. Bone marrow signal intensity normal. No scalp soft tissue abnormality. Sinuses/Orbits: Patient status post bilateral ocular lens replacement. Paranasal sinuses are largely clear. No significant mastoid effusion. Inner ear structures grossly normal. Other: None. MRA HEAD FINDINGS Anterior circulation: Examination mildly degraded by motion. Visualized distal cervical segments of the internal carotid arteries are patent with antegrade flow. Petrous, cavernous, and supraclinoid segments patent without stenosis or other abnormality. Eight A1 segments widely patent. Normal anterior communicating artery complex. Anterior cerebral arteries patent without stenosis. No M1 stenosis or occlusion. Normal MCA bifurcations. Distal  MCA branches perfused and symmetric.  Posterior circulation: Both V4 segments patent without stenosis. Right vertebral artery dominant. Both PICA patent at their origins. Basilar widely patent to its distal aspect. Superior cerebellar and posterior cerebral arteries widely patent bilaterally. Anatomic variants: None significant.  No aneurysm. MRA NECK FINDINGS Aortic arch: Examination degraded by motion artifact. Visualized aortic arch normal caliber with normal branch pattern. No hemodynamically significant stenosis seen about the origin the great vessels. Right carotid system: Right common and internal carotid arteries widely patent without stenosis, evidence for dissection or occlusion. Left carotid system: Left common and internal carotid arteries widely patent without stenosis, evidence for dissection or occlusion. Vertebral arteries: Both vertebral arteries arise from the subclavian arteries. No proximal subclavian artery stenosis. Both vertebral arteries widely patent without stenosis, evidence for dissection or occlusion. Right vertebral artery dominant. Other: None IMPRESSION: MRI HEAD IMPRESSION: 1. 7 mm acute ischemic nonhemorrhagic infarct involving the posterior limb of the right internal capsule. 2. 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 MRA HEAD IMPRESSION: Normal intracranial MRA. No large vessel occlusion, hemodynamically significant stenosis, or other acute vascular abnormality. MRA NECK IMPRESSION: Normal MRA of the neck. Electronically Signed   By: Rise Mu M.D.   On: 02/07/2021 23:21   MR BRAIN WO CONTRAST  Result Date: 02/07/2021 CLINICAL DATA:  Initial evaluation for neuro deficit, stroke suspected. EXAM: MRI HEAD WITHOUT CONTRAST MRA HEAD WITHOUT CONTRAST MRA NECK WITHOUT AND WITH CONTRAST TECHNIQUE: Multiplanar, multi-echo pulse sequences of the brain and surrounding structures were acquired without intravenous contrast. Angiographic images of the Circle of Willis were acquired using MRA  technique without intravenous contrast. Angiographic images of the neck were acquired using MRA technique without and with intravenous contrast. Carotid stenosis measurements (when applicable) are obtained utilizing NASCET criteria, using the distal internal carotid diameter as the denominator. CONTRAST:  7mL GADAVIST GADOBUTROL 1 MMOL/ML IV SOLN COMPARISON:  Head CT from earlier the same day. FINDINGS: MRI HEAD FINDINGS Brain: Cerebral volume within normal limits. Minimal chronic microvascular ischemic disease noted involving the pons. 7 mm focus of restricted diffusion seen involving the posterior limb of the right internal capsule, consistent with an acute ischemic infarct (series 5, image 75). No associated hemorrhage or mass effect. No other evidence for acute or subacute ischemia. No other areas of chronic cortical infarction. No evidence for acute or chronic intracranial hemorrhage. No mass lesion or midline shift. No hydrocephalus or extra-axial fluid collection. Pituitary gland suprasellar region normal. Vascular: Major intracranial vascular flow voids are well maintained. Skull and upper cervical spine: Craniocervical junction within normal limits. Bone marrow signal intensity normal. No scalp soft tissue abnormality. Sinuses/Orbits: Patient status post bilateral ocular lens replacement. Paranasal sinuses are largely clear. No significant mastoid effusion. Inner ear structures grossly normal. Other: None. MRA HEAD FINDINGS Anterior circulation: Examination mildly degraded by motion. Visualized distal cervical segments of the internal carotid arteries are patent with antegrade flow. Petrous, cavernous, and supraclinoid segments patent without stenosis or other abnormality. Eight A1 segments widely patent. Normal anterior communicating artery complex. Anterior cerebral arteries patent without stenosis. No M1 stenosis or occlusion. Normal MCA bifurcations. Distal MCA branches perfused and symmetric. Posterior  circulation: Both V4 segments patent without stenosis. Right vertebral artery dominant. Both PICA patent at their origins. Basilar widely patent to its distal aspect. Superior cerebellar and posterior cerebral arteries widely patent bilaterally. Anatomic variants: None significant.  No aneurysm. MRA NECK FINDINGS Aortic arch: Examination degraded by motion artifact. Visualized aortic arch normal caliber  with normal branch pattern. No hemodynamically significant stenosis seen about the origin the great vessels. Right carotid system: Right common and internal carotid arteries widely patent without stenosis, evidence for dissection or occlusion. Left carotid system: Left common and internal carotid arteries widely patent without stenosis, evidence for dissection or occlusion. Vertebral arteries: Both vertebral arteries arise from the subclavian arteries. No proximal subclavian artery stenosis. Both vertebral arteries widely patent without stenosis, evidence for dissection or occlusion. Right vertebral artery dominant. Other: None IMPRESSION: MRI HEAD IMPRESSION: 1. 7 mm acute ischemic nonhemorrhagic infarct involving the posterior limb of the right internal capsule. 2. 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 MRA HEAD IMPRESSION: Normal intracranial MRA. No large vessel occlusion, hemodynamically significant stenosis, or other acute vascular abnormality. MRA NECK IMPRESSION: Normal MRA of the neck. Electronically Signed   By: Rise Mu M.D.   On: 02/07/2021 23:21   CT HEAD CODE STROKE WO CONTRAST  Result Date: 02/07/2021 CLINICAL DATA:  Code stroke. Initial evaluation for acute stroke. Right-sided facial droop. EXAM: CT HEAD WITHOUT CONTRAST TECHNIQUE: Contiguous axial images were obtained from the base of the skull through the vertex without intravenous contrast. COMPARISON:  CT from 04/22/2015. FINDINGS: Brain: Cerebral volume within normal limits. No acute intracranial  hemorrhage. No acute large vessel territory infarct. No mass lesion, midline shift or mass effect. No hydrocephalus or extra-axial fluid collection. Vascular: No hyperdense vessel. Skull: Scalp soft tissues and calvarium within normal limits. Sinuses/Orbits: Globes and orbital soft tissues within normal limits. Paranasal sinuses and mastoid air cells are clear. Other: None. ASPECTS [facility] Stroke Program Early CT Score) - Ganglionic level infarction (caudate, lentiform nuclei, internal capsule, insula, M1-M3 cortex): 7 - Supraganglionic infarction (M4-M6 cortex): 3 Total score (0-10 with 10 being normal): 10 IMPRESSION: 1. No acute intracranial abnormality. 2. ASPECTS is 10. Results were communicated by telephone at the time of interpretation on 02/07/2021 at 8:22 pm to provider Dr. Derry Lory by Dr. Whitman Hero. Electronically Signed   By: Rise Mu M.D.   On: 02/07/2021 20:26    PHYSICAL EXAM Pleasant middle-aged lady not in distress. . Afebrile. Head is nontraumatic. Neck is supple without bruit.    Cardiac exam no murmur or gallop. Lungs are clear to auscultation. Distal pulses are well felt.  Neurological Exam :  Awake alert oriented to time place and person.  Mild dysarthria.  Extraocular movements are full range without nystagmus.  Mild left lower facial weakness.  Tongue midline.  Motor system exam shows left hemiparesis with mild left upper and lower extremity drift and 4/5 strength.  Weakness of left grip intrinsic hand muscles.  Diminished fine finger movements on the left.  Orbits right over left upper extremity.  Mild left hip flexor and ankle dorsiflexor weakness.  Decreased sensation on the left.  Gait not tested ASSESSMENT/PLAN Lindsay Rice is a 70 y.o. female with PMH significant for migraines on ajovy, history of DM2, HTN, HLD, mitral valve prolapse, OSA who presents with acute onset left sided weakness. She was attempting to get out of her car when she fell due to left leg  weakness. She got to her home and was in the toilet and then fell again and had trouble getting up due to left arm weakness. Husband helped her and then called EMS. No prior history of strokes, no recent surgeries, did not hit her head today. Endorses mild chest tenderness to palpation, no tearing chest pain going into her back and no history of aortic dilatation  or dissection.  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    MRI  7 mm acute ischemic nonhemorrhagic infarct involving the posterior limb of the right internal capsule.  Mild chronic microvascular ischemic disease involving the pons MRA normal intracranial MRA, 2D Echo pending LDL 82 HgbA1c 8.3 VTE prophylaxis - SCDs    Diet   Diet Carb Modified Fluid consistency: Thin; Room service appropriate? Yes   aspirin 81 mg daily prior to admission, now on aspirin and Plavix for 3 weeks followed by Plavix Pending results of 24-hour CT, start aspirin Therapy recommendations: Pending Disposition: Pending  Hypertension Home meds: Amlodipine 10 mg, losartan 100 mg, metoprolol 100 mg Stable Long-term BP goal normotensive   Hyperlipidemia Home meds: Simvastatin 40 mg, resumed in hospital LDL 82, goal < 70 High intensity statin not indicated  Continue statin at discharge  Diabetes type II Uncontrolled Home meds: None HgbA1c 8.3, goal < 7.0 CBGs Recent Labs    02/07/21 2005 02/08/21 0758  GLUCAP 408* 219*    SSI NovoLog 0-9 units  Other Stroke Risk Factors Advanced Age >/= 62  Obesity, Body mass index is 33.31 kg/m., BMI >/= 30 associated with increased stroke risk, recommend weight loss, diet and exercise as appropriate  Migraines Ajovy subcu every 30 days PRN Fioricet Sumatriptan discontinued Congestive heart failure Obstructive sleep apnea  Other Active  Problems GERD: Continue home famotidine. Asthma: Continue home inhalers. Hypothyroidism: Continue home Synthroid.  Hospital day # 1  Patient seen and examined by NP/APP with MD. MD to update note as needed.   Elmer Picker, DNP, FNP-BC [facility] Pager: [phone]  STROKE MD NOTE: I have personally obtained history,examined this patient, reviewed notes, independently viewed imaging studies, participated in medical decision making and plan of care.ROS completed by me personally and pertinent positives fully documented  I have made any additions or clarifications directly to the above note. Agree with note above.  Patient presented with left hemiparesis secondary to right subcortical infarct from small vessel disease.  She received IV TN K with only mild improvement.  Recommend close neurological monitoring and strict blood pressure control as per postthrombolytic protocol.  Mobilize out of bed.  Physical occupational and speech therapy consults.  Check echocardiogram and aggressive risk factor modification.  No family available at the bedside for discussion.This patient is critically ill and at significant risk of neurological worsening, death and care requires constant monitoring of vital signs, hemodynamics,respiratory and cardiac monitoring, extensive review of multiple databases, frequent neurological assessment, discussion with family, other specialists and medical decision making of high complexity.I have made any additions or clarifications directly to the above note.This critical care time does not reflect procedure time, or teaching time or supervisory time of PA/NP/Med Resident etc but could involve care discussion time.  I spent 30 minutes of neurocritical care time  in the care of  this patient.     Delia Heady, MD Medical Director [facility] Pager: [phone] 02/08/2021 9:39 PM   To contact Stroke Continuity provider, please refer to  [email or web address]. After hours, contact General Neurology

## 2021-02-08 NOTE — TOC CAGE-AID Note (Signed)
Transition of Care [facility]) - CAGE-AID Screening   Patient Details  Name: Lindsay Rice MRN: [id number] Date of Birth: 11-17-1950  Transition of Care [facility]) CM/SW Contact:    Morelia Cassells C Tarpley-Carter, LCSWA Phone Number: 02/08/2021, 9:49 AM   Clinical Narrative: Pt is unable to participate in Cage Aid.  Pt is critically ill.  Verdean Murin Tarpley-Carter, MSW, LCSW-A Pronouns:  She/Her/Hers [place] Transitions of Care Clinical Social Worker Direct Number:  [phone] [email or web address]  CAGE-AID Screening: Substance Abuse Screening unable to be completed due to: : Patient unable to participate             Substance Abuse Education Offered: No

## 2021-02-08 NOTE — Evaluation (Signed)
Occupational Therapy Evaluation Patient Details Name: Lindsay Rice MRN: [id number] DOB: [birth year] Today's Date: 02/08/2021   History of Present Illness Pt is a 70 y/o female admitted 12/20 with acute onset L sided weakness. 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   Clinical Impression   Pt admitted for concerns listed above. PTA Pt reported that she was independent with all ADL's and IADL's. At this time, pt presents with L sided weakness and incoordination, as well as balance deficits, and decreased activity tolerance. Pt is very motivated and reports already working on moving her LUE and attempting to use it functionally. She is requiring increased assist for all tasks, and at this time will benefit from a LUE hand rest for the RW. OT recommending AIR due to pt's motivation, and potential to make significant improvements with intensive rehab. OT will continue to follow acutely.      Recommendations for follow up therapy are one component of a multi-disciplinary discharge planning process, led by the attending physician.  Recommendations may be updated based on patient status, additional functional criteria and insurance authorization.   Follow Up Recommendations  Acute inpatient rehab (3hours/day)    Assistance Recommended at Discharge Frequent or constant Supervision/Assistance  Functional Status Assessment  Patient has had a recent decline in their functional status and demonstrates the ability to make significant improvements in function in a reasonable and predictable amount of time.  Equipment Recommendations  Other (comment) (TBD)    Recommendations for Other Services Rehab consult     Precautions / Restrictions Precautions Precautions: Fall Restrictions Weight Bearing Restrictions: No      Mobility Bed Mobility Overal bed mobility: Needs Assistance Bed Mobility: Supine to Sit;Sit to Supine     Supine to sit: Min  guard Sit to supine: Min guard   General bed mobility comments: Pt using bed railings, increased time, and stand by assist for bed mobility. no physical assist needed.    Transfers Overall transfer level: Needs assistance Equipment used: Rolling walker (2 wheels) Transfers: Sit to/from Stand Sit to Stand: Mod assist           General transfer comment: Pt mod A to stand and balance, max A to take steps      Balance Overall balance assessment: Needs assistance Sitting-balance support: Single extremity supported Sitting balance-Leahy Scale: Fair Sitting balance - Comments: Pt can not tolerate challenge to sitting balance, mild posterior lean able to self correct Postural control: Posterior lean;Left lateral lean Standing balance support: Single extremity supported;Bilateral upper extremity supported Standing balance-Leahy Scale: Poor Standing balance comment: Reliant on external support and RW, initial stand pt able to find her balance, with increased time up, pt begins to lean to the left requiring up to max support from OT to remain upright.                           ADL either performed or assessed with clinical judgement   ADL Overall ADL's : Needs assistance/impaired Eating/Feeding: Minimal assistance;Sitting   Grooming: Minimal assistance;Sitting   Upper Body Bathing: Moderate assistance;Sitting   Lower Body Bathing: Maximal assistance;Sitting/lateral leans;Sit to/from stand   Upper Body Dressing : Moderate assistance;Sitting   Lower Body Dressing: Maximal assistance;Sitting/lateral leans;Sit to/from stand   Toilet Transfer: Maximal assistance;Stand-pivot   Toileting- Clothing Manipulation and Hygiene: Maximal assistance;Sitting/lateral lean;Sit to/from stand       Functional mobility during ADLs: Maximal assistance;Rolling walker (2  wheels) General ADL Comments: Pt requiring increased assist due to poor balance, L lateral lean, LUE and LLE  weakness/incoordination,     Vision Baseline Vision/History: 1 Wears glasses Ability to See in Adequate Light: 0 Adequate Patient Visual Report: No change from baseline Vision Assessment?: No apparent visual deficits Additional Comments: Pt peripherals, tracking, saccades, and convergence all appear WNL     Perception     Praxis Praxis Praxis-Other Comments: in LUE    Pertinent Vitals/Pain Pain Assessment: No/denies pain     Hand Dominance Right   Extremity/Trunk Assessment Upper Extremity Assessment Upper Extremity Assessment: LUE deficits/detail LUE Deficits / Details: Weak 3/5, unable to maintain 90 degrees for more than 5 seconds, poor coordination. Diminshed sensation LUE Sensation: decreased light touch LUE Coordination: decreased fine motor;decreased gross motor   Lower Extremity Assessment Lower Extremity Assessment: Defer to PT evaluation   Cervical / Trunk Assessment Cervical / Trunk Assessment: Kyphotic   Communication Communication Communication: No difficulties   Cognition Arousal/Alertness: Awake/alert Behavior During Therapy: WFL for tasks assessed/performed Overall Cognitive Status: Within Functional Limits for tasks assessed                                       General Comments  VSS on RA    Exercises     Shoulder Instructions      Home Living Family/patient expects to be discharged to:: Private residence Living Arrangements: Spouse/significant other Available Help at Discharge: Family;Available 24 hours/day Type of Home: House Home Access: Stairs to enter [employer] of Steps: 3 steps   Home Layout: Two level;Laundry or work area in basement;Able to live on main level with bedroom/bathroom Alternate Teacher, music of Steps: full flight   Bathroom Shower/Tub: Producer, television/film/video: Handicapped height     Home Equipment: Agricultural consultant (2 wheels);Rollator (4 wheels);Cane - single point;Shower  seat - built in          Prior Functioning/Environment Prior Level of Function : Independent/Modified Independent                        OT Problem List: Decreased strength;Decreased range of motion;Decreased activity tolerance;Impaired balance (sitting and/or standing);Decreased coordination;Decreased safety awareness;Decreased knowledge of use of DME or AE;Impaired sensation;Impaired UE functional use      OT Treatment/Interventions: Self-care/ADL training;Therapeutic exercise;Neuromuscular education;Energy conservation;DME and/or AE instruction;Therapeutic activities;Patient/family education;Balance training    OT Goals(Current goals can be found in the care plan section) Acute Rehab OT Goals Patient Stated Goal: To be home for Christmas OT Goal Formulation: With patient Time For Goal Achievement: 02/22/21 Potential to Achieve Goals: Good ADL Goals Pt Will Perform Grooming: with min guard assist;standing Pt Will Perform Lower Body Bathing: with min assist;sitting/lateral leans;sit to/from stand Pt Will Perform Lower Body Dressing: with min assist;sitting/lateral leans;sit to/from stand Pt Will Transfer to Toilet: with min guard assist;ambulating Pt Will Perform Toileting - Clothing Manipulation and hygiene: with min guard assist;sitting/lateral leans;sit to/from stand Pt/caregiver will Perform Home Exercise Program: Increased strength;Left upper extremity;With theraband;With theraputty;Independently Additional ADL Goal #1: Pt will use her LUE 50% of the time with functional tasks.  OT Frequency: Min 2X/week   Barriers to D/C:            Co-evaluation              AM-PAC OT "6 Clicks" Daily Activity  Outcome Measure Help from another person eating meals?: A Little Help from another person taking care of personal grooming?: A Little Help from another person toileting, which includes using toliet, bedpan, or urinal?: A Lot Help from another person bathing  (including washing, rinsing, drying)?: A Lot Help from another person to put on and taking off regular upper body clothing?: A Lot Help from another person to put on and taking off regular lower body clothing?: A Lot 6 Click Score: 14   End of Session Equipment Utilized During Treatment: Gait belt;Rolling walker (2 wheels) Nurse Communication: Mobility status  Activity Tolerance: Patient tolerated treatment well Patient left: in bed;with call bell/phone within reach;with bed alarm set  OT Visit Diagnosis: Other abnormalities of gait and mobility (R26.89);Unsteadiness on feet (R26.81);Muscle weakness (generalized) (M62.81);Hemiplegia and hemiparesis Hemiplegia - Right/Left: Left Hemiplegia - dominant/non-dominant: Non-Dominant Hemiplegia - caused by: Cerebral infarction                Time: 0350-0938 OT Time Calculation (min): 35 min Charges:  OT General Charges $OT Visit: 1 Visit OT Evaluation $OT Eval Moderate Complexity: 1 Mod OT Treatments $Therapeutic Activity: 8-22 mins  Elsia Lasota H., OTR/L Acute Rehabilitation  Mckensey Berghuis Elane Leisel Pinette 02/08/2021, 7:21 PM

## 2021-02-08 NOTE — ED Notes (Addendum)
Ice chips given to pt, no longer NPO

## 2021-02-08 NOTE — Progress Notes (Signed)
NEUROLOGY CONSULTATION PROGRESS NOTE   Date of service: February 08, 2021 Patient Name: [name] MRN:  [id number] DOB:  January 24, 1951  Brief HPI   Kimala Horne Kristensen is a 70 y.o. female with PMH significant for migraines on ajovy, history of DM2, HTN, HLD, mitral valve prolapse, OSA who presents with acute onset left sided weakness.   Interval Hx   MRI Brain shows 50mm Right internal capsule stroke.  Vitals   Vitals:   02/08/21 1200 02/08/21 1423 02/08/21 2001 02/08/21 2100  BP: (!) 145/71 (!) 144/73 (!) 162/81   Pulse: 89 (!) 106 (!) 108   Resp: 20 20 18    Temp:  98.7 F (37.1 C) 98.1 F (36.7 C)   TempSrc:   Oral   SpO2: 95% 97% 95% 93%  Weight:      Height:         Body mass index is 33.31 kg/m.  Physical Exam   General: Laying comfortably in bed; in no acute distress.  HENT: Normal oropharynx and mucosa. Normal external appearance of ears and nose.  Neck: Supple, no pain or tenderness  CV: No JVD. No peripheral edema.  Pulmonary: Symmetric Chest rise. Normal respiratory effort.  Abdomen: Soft to touch, non-tender.  Ext: No cyanosis, edema, or deformity  Skin: No rash. Normal palpation of skin.   Musculoskeletal: Normal digits and nails by inspection. No clubbing.   Neurologic Examination  Mental status/Cognition: Alert, oriented to self, place, month and year, good attention.  Speech/language: Fluent, comprehension intact, object naming intact, repetition intact.  Cranial nerves:   CN II Pupils equal and reactive to light, no VF deficits    CN III,IV,VI EOM intact, no gaze preference or deviation, no nystagmus    CN V normal sensation in V1, V2, and V3 segments bilaterally    CN VII Mild left facial droop   CN VIII normal hearing to speech    CN IX & X normal palatal elevation, no uvular deviation    CN XI 5/5 head turn and 5/5 shoulder shrug bilaterally    CN XII midline tongue protrusion    Motor:  Muscle bulk: normal, tone normal, pronator drift noted in  LUE. Mvmt Root Nerve  Muscle Right Left Comments  SA C5/6 Ax Deltoid 5 4   EF C5/6 Mc Biceps 5 4   EE C6/7/8 Rad Triceps 5 4   WF C6/7 Med FCR     WE C7/8 PIN ECU     F Ab C8/T1 U ADM/FDI 5 3   HF L1/2/3 Fem Illopsoas 5 4   KE L2/3/4 Fem Quad 5 3   DF L4/5 D Peron Tib Ant 5 4   PF S1/2 Tibial Grc/Sol 5 4    Reflexes:  Right Left Comments  Pectoralis      Biceps (C5/6) 2 2   Brachioradialis (C5/6) 2 2    Triceps (C6/7) 2 2    Patellar (L3/4) 2 2    Achilles (S1)      Hoffman      Plantar     Jaw jerk    Sensation:  Light touch Intact throughout   Pin prick    Temperature    Vibration   Proprioception    Coordination/Complex Motor:  - Finger to Nose intact on the Right, mild ataxia in LUE - Heel to shin unable to do - Rapid alternating movement slowed in LUE - Gait: deferred due to patient safety.  Labs   Basic Metabolic Panel:  Lab Results  Component Value Date   NA 135 02/07/2021   K 4.1 02/07/2021   CO2 23 02/07/2021   GLUCOSE 382 (H) 02/07/2021   BUN 18 02/07/2021   CREATININE 0.80 02/07/2021   CALCIUM 9.3 02/07/2021   GFRNONAA >60 02/07/2021   HbA1c:  Lab Results  Component Value Date   HGBA1C 8.3 (H) 02/08/2021   LDL:  Lab Results  Component Value Date   LDLCALC 82 02/08/2021   Urine Drug Screen: No results found for: LABOPIA, COCAINSCRNUR, LABBENZ, AMPHETMU, THCU, LABBARB  Alcohol Level No results found for: ETH No results found for: PHENYTOIN, ZONISAMIDE, LAMOTRIGINE, LEVETIRACETA No results found for: PHENYTOIN, PHENOBARB, VALPROATE, CBMZ  Imaging and Diagnostic studies   Repeat CTH w/o C: Small, ill-defined subacute infarct along the para thalamic region on the right. This is slightly more prominent on the current exam when compared to the prior study and corresponds to findings seen on the prior MRI.  MRI Brain without contrast:  1. 7 mm acute ischemic nonhemorrhagic infarct involving the posterior limb of the right internal  capsule. 2. 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  MRA HEAD: Normal intracranial MRA. No large vessel occlusion, hemodynamically significant stenosis, or other acute vascular abnormality.   MRA NECK:  Normal MRA of the neck.   TTE: EF of 65-70% grade 1 diastolic dysfunction. No IAS on color flow doppler.  Impression   Lindsay Rice is a 70 y.o. female with PMH significant for migraines on ajovy, history of DM2, HTN, HLD, mitral valve prolapse, OSA who presents with acute onset left sided weakness. 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  Etiology of stroke is likely small vessel disease. Risk factors include Diabetes, HTN and HLD.  Recommendations   Right internal capsule stroke s/p tNKASE: - Repeat CTH with no ICH. - Frequent Neuro checks per stroke unit protocol - Recommend obtaining TTE with EF of 65-70%, grade 1 diastolic dysfunction. No IAS on color flow doppler. - LDL elevated to 82, will switch her to Atorvastatin 40mg  daily from Simvastatin. - HbA1c of 8.3. follow up with PCP for Diabetes control. Counseled on the importance of Diabetes control to reduce risks of strokes in the future. - Antithrombotic - Aspirin 81mg  daily along with plavix 75mg  daily x 21 days followed by Aspirin 81mg  daily alone. - DVT ppx with SQ Heparin - SBP goal - Gradual normotension. Will start half dose of home Amlodipine. - Recommend Telemetry monitoring for arrythmia - Passed bedside swallow. - Stroke education booklet - PT/OT/SLP consult. Will be an excellent candidate for rehab.  Diabetes: - HbA1c of 8.3 - Will do sliding scale insulin with carb controlled diet - Follow up with PCP. - Counseled on the importance of glucose control to reduce risk of future stroke. Goal HbA1c of < 7.  Hyperlipidemia: LDL is 82. Switched to Atorvastatin 40mg  daily from home Simvastatin.   Hypertension: - Will aim for gradual normotension. - Will start Amlodipine 5mg   daily.  GERD: Continue home famotidine.  Migraines: Will use home Fioricet as needed. Will discontinue home sumatriptan as this is contraindicated in patients with stroke. Continue home Ajovy injection subcu every 30 days.  Asthma: Continue home inhalers.  Hypothyroidism: Continue home Synthroid.  _____________________________________________________________________   Thank you for the opportunity to take part in the care of this patient. If you have any further questions, please contact the neurology consultation attending.  Signed,  [facility] Pager Number 

## 2021-02-08 NOTE — Progress Notes (Signed)
°  Echocardiogram 2D Echocardiogram has been performed.  Roosvelt Maser F 02/08/2021, 12:25 PM

## 2021-02-08 NOTE — Progress Notes (Addendum)
Inpatient Diabetes Program Recommendations  AACE/ADA: New Consensus Statement on Inpatient Glycemic Control (2015)  Target Ranges:  Prepandial:   less than 140 mg/dL      Peak postprandial:   less than 180 mg/dL (1-2 hours)      Critically ill patients:  140 - 180 mg/dL   Lab Results  Component Value Date   GLUCAP 219 (H) 02/08/2021   HGBA1C 8.3 (H) 02/08/2021    Review of Glycemic Control  Latest Reference Range & Units 02/07/21 20:05 02/08/21 07:58  Glucose-Capillary 70 - 99 mg/dL 092 (H) 330 (H)   Diabetes history: DM 2 Outpatient Diabetes medications:  None Current orders for Inpatient glycemic control:  Novolog sensitive tid with meals  Inpatient Diabetes Program Recommendations:    May consider adding Semglee 15 units daily while in the hospital.  Based on patient's A1C, she will need medications added for DM.  May also benefit from the addition of either a SGLT-2 or GLP-1 (weekly injection).  Will attempt to discuss with patient and family today.   Thanks,  Beryl Meager, RN, BC-ADM Inpatient Diabetes Coordinator Pager [phone]  (8a-5p)  Addendum:  [phone]- spoke with patient, husband and daughter.  Per patient she was on metformin in the past but it was stopped due too vomiting.  We discussed curent A1C and patient was surprised that it was that high.  She states when she checks her blood sugars, they are usually in the 120's.  Told patient that she may need another oral agent added at d/c.  Would not add metformin since patient was taken off it a few months ago.  Patient agreed and understood the importance of controlling blood sugars.  She wants to go home prior to Christmas.  Encouraged close f/u with PCP as well.

## 2021-02-08 NOTE — Progress Notes (Signed)
°  Transition of Care [facility]) Screening Note   Patient Details  Name: Lindsay Rice Date of Birth: 01-Jun-1950   Transition of Care [facility]) CM/SW Contact:    Kermit Balo, RN Phone Number: 02/08/2021, 3:05 PM    Transition of Care Department [facility]) has reviewed patient. We will continue to monitor patient advancement through interdisciplinary progression rounds. If new patient transition needs arise, please place a TOC consult.

## 2021-02-08 NOTE — ED Notes (Signed)
Speech Therapy at bedside

## 2021-02-08 NOTE — Progress Notes (Signed)
OT Cancellation Note  Patient Details Name: Lindsay Rice MRN: [id number] DOB: 1950-04-12   Cancelled Treatment:    Reason Eval/Treat Not Completed: Active bedrest order- Pt on strict bedrest, will follow and see as able as activity orders are updated.   Barry Brunner, OT Acute Rehabilitation Services Pager [phone] Office [phone]   Chancy Milroy 02/08/2021, 8:25 AM

## 2021-02-08 NOTE — TOC Benefit Eligibility Note (Signed)
Patient Product/process development scientist completed.    The patient is currently admitted and upon discharge could be taking Farxiga 5 mg.  The current 30 day co-pay is, $47.00.   The patient is currently admitted and upon discharge could be taking Jardiance 25 mg.  The current 30 day co-pay is, $47.00.   The patient is currently admitted and upon discharge could be taking Victozia 18mg /3 ml.  The current 30 day co-pay is, $47.00.   The patient is insured through [employer] [employer]     [facility], CPhT Pharmacy Patient Advocate Specialist [facility] Patient Advocate Team Direct Number: [phone]  Fax: [phone]

## 2021-02-08 NOTE — Progress Notes (Signed)
PT Cancellation Note  Patient Details Name: Lindsay Rice MRN: [id number] DOB: 1950/08/18   Cancelled Treatment:    Reason Eval/Treat Not Completed: Medical issues which prohibited therapy;Active bedrest order.  Retry as time and pt allow.   Ivar Drape 02/08/2021, 10:34 AM  Samul Dada, PT PhD Acute Rehab Dept. Number: [facility] [id number] and [facility] [phone]

## 2021-02-09 LAB — GLUCOSE, CAPILLARY
Glucose-Capillary: 185 mg/dL — ABNORMAL HIGH (ref 70–99)
Glucose-Capillary: 164 mg/dL — ABNORMAL HIGH (ref 70–99)
Glucose-Capillary: 208 mg/dL — ABNORMAL HIGH (ref 70–99)
Glucose-Capillary: 225 mg/dL — ABNORMAL HIGH (ref 70–99)
Glucose-Capillary: 166 mg/dL — ABNORMAL HIGH (ref 70–99)

## 2021-02-09 MED ORDER — STROKE: EARLY STAGES OF RECOVERY BOOK
Status: AC
  Filled 2021-02-09: qty 1

## 2021-02-09 MED ORDER — ATORVASTATIN CALCIUM 80 MG PO TABS
80.0000 mg | ORAL_TABLET | Freq: Every day | ORAL | Status: DC
  Administered 2021-02-09: 23:00:00 80 mg via ORAL
  Filled 2021-02-09: qty 1

## 2021-02-09 MED ORDER — EMPAGLIFLOZIN 25 MG PO TABS
25.0000 mg | ORAL_TABLET | Freq: Every day | ORAL | Status: DC
  Administered 2021-02-09 – 2021-02-10 (×2): 25 mg via ORAL
  Filled 2021-02-09 (×2): qty 1

## 2021-02-09 NOTE — Research (Signed)
OPTMISTmain Research Trial   The patient meets criteria for the OPTIMISTmain Research Trial. The patient received tenectaplase (TNK) with a NIHSS < 10 with no critical care needs two hours after medication given.   I have reviewed the Patient Information Sheet and the patient reports understanding of data collection, discharge questions, and 90 day questions. No further questions noted and Patient Information Sheet left with patient and/or family member.    Patient Reports a good number for follow-up care is: [phone].  Discharge questions (Pt reported experience measures) asked, answers documented.  Lindsay Rice  Stroke Response RN

## 2021-02-09 NOTE — Plan of Care (Signed)
  Problem: Education: Goal: Knowledge of General Education information will improve Description: Including pain rating scale, medication(s)/side effects and non-pharmacologic comfort measures Outcome: Progressing   Problem: Health Behavior/Discharge Planning: Goal: Ability to manage health-related needs will improve Outcome: Progressing   Problem: Clinical Measurements: Goal: Ability to maintain clinical measurements within normal limits will improve Outcome: Progressing Goal: Will remain free from infection Outcome: Progressing Goal: Diagnostic test results will improve Outcome: Progressing Goal: Respiratory complications will improve Outcome: Progressing Goal: Cardiovascular complication will be avoided Outcome: Progressing   Problem: Activity: Goal: Risk for activity intolerance will decrease Outcome: Progressing   Problem: Nutrition: Goal: Adequate nutrition will be maintained Outcome: Progressing   Problem: Coping: Goal: Level of anxiety will decrease Outcome: Progressing   Problem: Elimination: Goal: Will not experience complications related to bowel motility Outcome: Progressing Goal: Will not experience complications related to urinary retention Outcome: Progressing   Problem: Pain Managment: Goal: General experience of comfort will improve Outcome: Progressing   Problem: Safety: Goal: Ability to remain free from injury will improve Outcome: Progressing   Problem: Skin Integrity: Goal: Risk for impaired skin integrity will decrease Outcome: Progressing   Problem: Education: Goal: Knowledge of disease or condition will improve Outcome: Progressing Goal: Knowledge of secondary prevention will improve (SELECT ALL) Outcome: Progressing Goal: Knowledge of patient specific risk factors will improve (INDIVIDUALIZE FOR PATIENT) Outcome: Progressing Goal: Individualized Educational Video(s) Outcome: Progressing   Problem: Coping: Goal: Will verbalize  positive feelings about self Outcome: Progressing Goal: Will identify appropriate support needs Outcome: Progressing   Problem: Health Behavior/Discharge Planning: Goal: Ability to manage health-related needs will improve Outcome: Progressing   Problem: Self-Care: Goal: Ability to participate in self-care as condition permits will improve Outcome: Progressing Goal: Verbalization of feelings and concerns over difficulty with self-care will improve Outcome: Progressing Goal: Ability to communicate needs accurately will improve Outcome: Progressing   Problem: Ischemic Stroke/TIA Tissue Perfusion: Goal: Complications of ischemic stroke/TIA will be minimized Outcome: Progressing   

## 2021-02-09 NOTE — Progress Notes (Signed)
STROKE TEAM PROGRESS NOTE   INTERVAL HISTORY No family is at the bedside.  Patient is doing well and has no complaints.  She still has some residual right-sided weakness therapist recommend inpatient rehab.  Vital signs are stable.  Blood pressure adequately controlled.  Echocardiogram shows normal ejection fraction without cardiac source of embolism.  Vitals:   02/09/21 0316 02/09/21 0818 02/09/21 0847 02/09/21 1236  BP: (!) 152/64  (!) 163/91 (!) 146/81  Pulse: 88  94 98  Resp: 18  18 18   Temp: 98 F (36.7 C)  98.1 F (36.7 C) 98.3 F (36.8 C)  TempSrc: Oral  Oral   SpO2: 100% 96% 97% 95%  Weight:      Height:       CBC:  Recent Labs  Lab 02/07/21 2022 02/07/21 2029 02/08/21 0500  WBC 10.6*  --  10.7*  NEUTROABS 7.8*  --   --   HGB 12.1 12.2 11.9*  HCT 36.8 36.0 36.1  MCV 83.3  --  84.9  PLT 261  --  263   Basic Metabolic Panel:  Recent Labs  Lab 02/07/21 2022 02/07/21 2029 02/08/21 0500  NA 135 135  --   K 4.2 4.1  --   CL 100 101  --   CO2 23  --   --   GLUCOSE 373* 382*  --   BUN 17 18  --   CREATININE 1.01* 0.80 0.93  CALCIUM 9.3  --   --    Lipid Panel:  Recent Labs  Lab 02/08/21 0500  CHOL 159  TRIG 189*  HDL 39*  CHOLHDL 4.1  VLDL 38  LDLCALC 82   HgbA1c:  Recent Labs  Lab 02/08/21 0500  HGBA1C 8.3*   Urine Drug Screen: No results for input(s): LABOPIA, COCAINSCRNUR, LABBENZ, AMPHETMU, THCU, LABBARB in the last 168 hours.  Alcohol Level No results for input(s): ETH in the last 168 hours.  IMAGING past 24 hours CT HEAD WO CONTRAST (02/10/21)  Result Date: 02/08/2021 CLINICAL DATA:  Neuro deficit, stroke suspected. EXAM: CT HEAD WITHOUT CONTRAST TECHNIQUE: Contiguous axial images were obtained from the base of the skull through the vertex without intravenous contrast. COMPARISON:  Plain brain CT and MR head, dated February 07, 2021 FINDINGS: Brain: There is mild cerebral atrophy with widening of the extra-axial spaces and ventricular  dilatation. There are areas of decreased attenuation within the white matter tracts of the supratentorial brain, consistent with microvascular disease changes. A an ill-defined, approximately 8 mm diameter area of white matter low attenuation is seen along the para thalamic region on the right. This is slightly more prominent on the current exam when compared to the prior study and corresponds to findings seen on the prior MRI. There is no evidence of associated mass effect or midline shift. Vascular: No hyperdense vessel or unexpected calcification. Skull: Normal. Negative for fracture or focal lesion. Sinuses/Orbits: No acute finding. Other: None. IMPRESSION: Small, ill-defined subacute infarct along the para thalamic region on the right. This is slightly more prominent on the current exam when compared to the prior study and corresponds to findings seen on the prior MRI. Electronically Signed   By: February 09, 2021 M.D.   On: 02/08/2021 21:13    PHYSICAL EXAM Pleasant middle-aged lady not in distress. . Afebrile. Head is nontraumatic. Neck is supple without bruit.    Cardiac exam no murmur or gallop. Lungs are clear to auscultation. Distal pulses are well felt.  Neurological Exam :  Awake  alert oriented to time place and person.  Mild dysarthria.  Extraocular movements are full range without nystagmus.  Mild left lower facial weakness.  Tongue midline.  Motor system exam shows left hemiparesis with mild left upper and lower extremity drift and 4/5 strength.  Weakness of left grip intrinsic hand muscles.  Diminished fine finger movements on the left.  Orbits right over left upper extremity.  Mild left hip flexor and ankle dorsiflexor weakness.  Decreased sensation on the left.  Gait not tested ASSESSMENT/PLAN Laurette Lindsay Rice is a 70 y.o. female with PMH significant for migraines on ajovy, history of DM2, HTN, HLD, mitral valve prolapse, OSA who presents with acute onset left sided weakness. She was  attempting to get out of her car when she fell due to left leg weakness. She got to her home and was in the toilet and then fell again and had trouble getting up due to left arm weakness. Husband helped her and then called EMS. No prior history of strokes, no recent surgeries, did not hit her head today. Endorses mild chest tenderness to palpation, no tearing chest pain going into her back and no history of aortic dilatation or dissection.  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    MRI  7 mm acute ischemic nonhemorrhagic infarct involving the posterior limb of the right internal capsule.  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 LDL 82 HgbA1c 8.3 VTE prophylaxis - SCDs    Diet   Diet Carb Modified Fluid consistency: Thin; Room service appropriate? Yes   aspirin 81 mg daily prior to admission, now on aspirin and Plavix for 3 weeks followed by Plavix   Therapy recommendations: Pending Disposition: Pending  Hypertension Home meds: Amlodipine 10 mg, losartan 100 mg, metoprolol 100 mg Stable Long-term BP goal normotensive   Hyperlipidemia Home meds: Simvastatin 40 mg, resumed in hospital LDL 82, goal < 70 High intensity statin not indicated  Continue statin at discharge  Diabetes type II Uncontrolled Home meds: None HgbA1c 8.3, goal < 7.0 CBGs Recent Labs    02/09/21 0621 02/09/21 0849 02/09/21 1231  GLUCAP 185* 164* 208*    SSI NovoLog 0-9 units  Other Stroke Risk Factors Advanced Age >/= 10  Obesity, Body mass index is 33.31 kg/m., BMI >/= 30 associated with increased stroke risk, recommend weight loss, diet and exercise as appropriate  Migraines Ajovy subcu every 30 days PRN Fioricet Sumatriptan discontinued Congestive heart  failure Obstructive sleep apnea  Other Active Problems GERD: Continue home famotidine. Asthma: Continue home inhalers. Hypothyroidism: Continue home Synthroid.  Hospital day # 2  Patient presented with left hemiparesis secondary to right subcortical infarct from small vessel disease.  She received IV TNK with only mild improvement.  Continue ongoing therapies.  Mobilize out of bed.  Start aspirin Plavix for 3 weeks followed by Plavix alone.  Increase dose of statin and start oral diabetic medications.  Transfer to inpatient rehab over the next few days when bed available.  Discussed with patient and rehab coordinator.  Greater than 50% time during this 35-minute visit was spent on counseling and coordination of care about her stroke and discussion about risk factor management need for rehab     Delia Heady, MD Medical Director [facility] Pager: [phone] 02/09/2021 1:22 PM  To contact Stroke Continuity provider, please refer to [email or web address] After hours, contact General Neurology

## 2021-02-09 NOTE — PMR Pre-admission (Signed)
PMR Admission Coordinator Pre-Admission Assessment  Patient: Lindsay Rice is an 70 y.o., female MRN: [id number] DOB: 24-Jul-1950 Height: _0  (165.1 cm) Weight: 90.8 kg  Insurance Information HMO:     PPO:      PCP:      IPA:      80/20:      OTHER:  PRIMARY: Medicare a and b      Policy#: [id number]      Subscriber: pt Benefits:  Phone #: passport one source online     Name: 02/09/21 Eff. Date: 02/20/2016     Deduct: $1556      Out of Pocket Max: none      Life Max: none CIR: 100%      SNF: 20 days Outpatient: 80%     Co-Pay: 20% Home Health: 100%      Co-Pay: none DME: 80%     Co-Pay: 20% Providers: pt choice  SECONDARY: State [employer]      Policy#: [id number]  THIRD; Tricare for Life  policy # [id number]  Financial Counselor:       Phone#:   The Data Collection Information Summary for patients in Inpatient Rehabilitation Facilities with attached Privacy Act [place] Records was provided and verbally reviewed with: Patient  Emergency Contact Information Contact Information     Name Relation Home Work Mobile   Tosi,JAMES Spouse   [phone]      Current Medical History  Patient Admitting Diagnosis: CVA  History of Present Illness:  70 year old right-handed female with history of migraine headaches maintained on ajovy subcutaneous every 30 days as well as Depakote, diabetes mellitus, obesity with BMI 33.31, hypertension, hyperlipidemia, mitral valve prolapse, OSA.   Presented 02/07/2021 with acute onset of left-sided weakness while getting out of her car resulting in a fall.  She was able to get into her home and went to the bathroom and fell again without loss of consciousness.  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  MRA of the head unremarkable no large vessel occlusion.  MRA of the neck negative without  dissection or occlusion.  Patient did receive TNKase.  Admission chemistries unremarkable except glucose 373, creatinine 1.01, WBC 10,600, hemoglobin A1c 8.3.  Echocardiogram with ejection fraction of 65 to 70% no wall motion abnormalities grade 1 diastolic dysfunction.  Neurology follow-up maintained on aspirin 81 mg daily and Plavix 75 mg daily x3 weeks followed by Plavix alone.  Subcutaneous Lovenox for DVT prophylaxis.  Tolerating a regular consistency diet.   Complete NIHSS TOTAL: 4  Patient[facility] medical [facility] has been reviewed by the rehabilitation admission coordinator and physician.  Past Medical History  No past medical history on file.  Has the patient had major surgery during 100 days prior to admission? No  Family History   family history is not on file.  Current Medications  Current Facility-Administered Medications:    acetaminophen (TYLENOL) tablet 650 mg, 650 mg, Oral, Q4H PRN **OR** acetaminophen (TYLENOL) 160 MG/5ML solution 650 mg, 650 mg, Per Tube, Q4H PRN **OR** acetaminophen (TYLENOL) suppository 650 mg, 650 mg, Rectal, Q4H PRN, Donnetta Simpers, MD   albuterol (PROVENTIL) (2.5 MG/3ML) 0.083% nebulizer solution 3 mL, 3 mL, Inhalation, Q6H PRN, Donnetta Simpers, MD   amitriptyline (ELAVIL) tablet 25 mg, 25 mg, Oral, QHS, Donnetta Simpers, MD, 25 mg at 02/09/21 2257   amLODipine (NORVASC) tablet  5 mg, 5 mg, Oral, Daily, Donnetta Simpers, MD, 5 mg at 02/09/21 1055   aspirin chewable tablet 81 mg, 81 mg, Oral, Daily, Donnetta Simpers, MD, 81 mg at 02/09/21 1056   atorvastatin (LIPITOR) tablet 80 mg, 80 mg, Oral, QHS, Garvin Fila, MD, 80 mg at 02/09/21 2257   busPIRone (BUSPAR) tablet 5 mg, 5 mg, Oral, BID, Donnetta Simpers, MD, 5 mg at 02/09/21 2257   butalbital-acetaminophen-caffeine (FIORICET) 50-325-40 MG per tablet 1 tablet, 1 tablet, Oral, Q6H PRN, Donnetta Simpers, MD, 1 tablet at 02/09/21 0343   clopidogrel (PLAVIX) tablet 75  mg, 75 mg, Oral, Daily, Donnetta Simpers, MD, 75 mg at 02/09/21 1056   divalproex (DEPAKOTE ER) 24 hr tablet 500 mg, 500 mg, Oral, QHS, Donnetta Simpers, MD, 500 mg at 02/09/21 2257   empagliflozin (JARDIANCE) tablet 25 mg, 25 mg, Oral, Daily, Leonie Man, Pramod S, MD, 25 mg at 02/09/21 1500   enoxaparin (LOVENOX) injection 40 mg, 40 mg, Subcutaneous, Q24H, Donnetta Simpers, MD, 40 mg at 02/09/21 2257   famotidine (PEPCID) tablet 40 mg, 40 mg, Oral, QHS, Donnetta Simpers, MD, 40 mg at 02/09/21 2257   gabapentin (NEURONTIN) capsule 300 mg, 300 mg, Oral, BID, Donnetta Simpers, MD, 300 mg at 02/09/21 2257   insulin aspart (novoLOG) injection 0-9 Units, 0-9 Units, Subcutaneous, TID WC, Donnetta Simpers, MD, 2 Units at 02/10/21 6967   levothyroxine (SYNTHROID) tablet 75 mcg, 75 mcg, Oral, QAC breakfast, Donnetta Simpers, MD, 75 mcg at 02/10/21 0720   LORazepam (ATIVAN) injection 1 mg, 1 mg, Intravenous, Once, Donnetta Simpers, MD   mometasone-formoterol (DULERA) 200-5 MCG/ACT inhaler 2 puff, 2 puff, Inhalation, BID, Donnetta Simpers, MD, 2 puff at 02/10/21 0816   pantoprazole (PROTONIX) EC tablet 40 mg, 40 mg, Oral, Daily, Rozann Lesches, RPH   senna-docusate (Senokot-S) tablet 1 tablet, 1 tablet, Oral, QHS PRN, Donnetta Simpers, MD   sertraline (ZOLOFT) tablet 100 mg, 100 mg, Oral, Daily, 100 mg at 02/09/21 1055 **AND** sertraline (ZOLOFT) tablet 50 mg, 50 mg, Oral, QHS, Donnetta Simpers, MD, 50 mg at 02/09/21 2257  Patients Current Diet:  Diet Order             Diet Carb Modified Fluid consistency: Thin; Room service appropriate? Yes with Assist  Diet effective now                  Precautions / Restrictions Precautions Precautions: Fall, Other (comment) Precaution Comments: L weakness Restrictions Weight Bearing Restrictions: No   Has the patient had 2 or more falls or a fall with injury in the past year? yes  Prior Activity Level Community (5-7x/wk): retired and  independent  Prior Functional Level Self Care: Did the patient need help bathing, dressing, using the toilet or eating? Independent  Indoor Mobility: Did the patient need assistance with walking from room to room (with or without device)? Independent  Stairs: Did the patient need assistance with internal or external stairs (with or without device)? Independent  Functional Cognition: Did the patient need help planning regular tasks such as shopping or remembering to take medications? Independent  Patient Information Are you of Hispanic, Latino/a,or Spanish origin?: A. No, not of Hispanic, Latino/a, or Spanish origin What is your race?: A. White Do you need or want an interpreter to communicate with a doctor or health care staff?: 0. No  Patient's Response To:  Health Literacy and Transportation Is the patient able to respond to health literacy and transportation needs?: Yes Health Literacy - How often do  you need to have someone help you when you read instructions, pamphlets, or other written material from your doctor or pharmacy?: Never In the past 12 months, has lack of transportation kept you from medical appointments or from getting medications?: No In the past 12 months, has lack of transportation kept you from meetings, work, or from getting things needed for daily living?: No  Home Assistive Devices / Equipment Home Equipment: Conservation officer, nature (2 wheels), Rollator (4 wheels), [employer] - single point, Civil engineer, contracting - built in  Prior Device Use: Indicate devices/aids used by the patient prior to current illness, exacerbation or injury? None of the above  Current Functional Level Cognition  Arousal/Alertness: Awake/alert Overall Cognitive Status: Within Functional Limits for tasks assessed Orientation Level: Oriented X4 General Comments: highly motivated Attention: Sustained Sustained Attention: Impaired Sustained Attention Impairment: Verbal complex, Functional complex Memory:  Appears intact Awareness: Appears intact Problem Solving: Appears intact Safety/Judgment: Appears intact    Extremity Assessment (includes Sensation/Coordination)  Upper Extremity Assessment: Defer to OT evaluation LUE Deficits / Details: Weak 3/5, unable to maintain 90 degrees for more than 5 seconds, poor coordination. Diminshed sensation LUE Sensation: decreased light touch LUE Coordination: decreased fine motor, decreased gross motor  Lower Extremity Assessment: LLE deficits/detail LLE Deficits / Details: grossly 3/5, numbness distally LLE Sensation: decreased proprioception    ADLs  Overall ADL's : Needs assistance/impaired Eating/Feeding: Minimal assistance, Sitting Grooming: Minimal assistance, Sitting Upper Body Bathing: Moderate assistance, Sitting Lower Body Bathing: Maximal assistance, Sitting/lateral leans, Sit to/from stand Upper Body Dressing : Moderate assistance, Sitting Lower Body Dressing: Maximal assistance, Sitting/lateral leans, Sit to/from stand Toilet Transfer: Maximal assistance, Stand-pivot Toileting- Clothing Manipulation and Hygiene: Maximal assistance, Sitting/lateral lean, Sit to/from stand Functional mobility during ADLs: Maximal assistance, Rolling walker (2 wheels) General ADL Comments: Pt requiring increased assist due to poor balance, L lateral lean, LUE and LLE weakness/incoordination,    Mobility  Overal bed mobility: Needs Assistance Bed Mobility: Supine to Sit Supine to sit: Min assist, HOB elevated Sit to supine: Min guard General bed mobility comments: +rail, increased time, assist to elevate trunk and scoot to EOB    Transfers  Overall transfer level: Needs assistance Equipment used: Rolling walker (2 wheels) Transfers: Sit to/from Stand Sit to Stand: Mod assist General transfer comment: assist to power up and stabilize balance. Assist to maintain L grip on RW.    Ambulation / Gait / Stairs / Wheelchair Mobility   Ambulation/Gait Ambulation/Gait assistance: Mod assist Gait Distance (Feet): 5 Feet Assistive device: Rolling walker (2 wheels) Gait Pattern/deviations: Step-to pattern, Decreased stride length, Decreased weight shift to left General Gait Details: cues for sequencing, assist to maintain balance and L grip on RW, cues to look up as pt tends to watch her feet Gait velocity: decreased    Posture / Balance Dynamic Sitting Balance Sitting balance - Comments: Posterior lean with initial sit. Able to stabilize and maintain min guard assist. Balance Overall balance assessment: Needs assistance Sitting-balance support: Feet supported, No upper extremity supported, Single extremity supported Sitting balance-Leahy Scale: Fair Sitting balance - Comments: Posterior lean with initial sit. Able to stabilize and maintain min guard assist. Postural control: Posterior lean Standing balance support: Single extremity supported, Bilateral upper extremity supported, Reliant on assistive device for balance Standing balance-Leahy Scale: Poor Standing balance comment: increased time to stabilize static balance with initial stance    Special needs/care consideration Hgb A1c 8.3   Previous Home Environment  Living Arrangements: Spouse/significant other  Lives With: Spouse Available  Help at Discharge: Family, Available 24 hours/day Type of Home: House Home Layout: Two level, Laundry or work area in basement, Able to live on main level with bedroom/bathroom Alternate Level Stairs-Number of Steps: flight Home Access: Stairs to enter Technical brewer of Steps: 3 Bathroom Shower/Tub: Multimedia programmer: Handicapped height Bathroom Accessibility: Yes How Accessible: Accessible via walker [place]: No  Discharge Living Setting Plans for Discharge Living Setting: Patient's home, Lives with (comment) (spouse) Type of Home at Discharge: House Discharge Home Layout: Two level, Laundry  or work area in basement, Able to live on main level with bedroom/bathroom Alternate Level Stairs-Number of Steps: flight Discharge Home Access: Stairs to enter Entrance Stairs-Rails: None Entrance Stairs-Number of Steps: 3 Discharge Bathroom Shower/Tub: Pharmacologist Toilet: Handicapped height Discharge Bathroom Accessibility: Yes How Accessible: Accessible via walker Does the patient have any problems obtaining your medications?: No  Social/Family/Support Systems Patient Roles: Spouse Contact Information: spouse, Jeneen Rinks Anticipated Caregiver: children and grandchildren Anticipated Caregiver's Contact Information: see contacts Ability/Limitations of Caregiver: spouse 53 years old Caregiver Availability: 24/7 Discharge Plan Discussed with Primary Caregiver: Yes Is Caregiver In Agreement with Plan?: Yes Does Caregiver/Family have Issues with Lodging/Transportation while Pt is in Rehab?: No  Goals Patient/Family Goal for Rehab: Mod I to supervision with PT and OT, supervision with SLP Expected length of stay: ELOS 7 to 10 days Pt/Family Agrees to Admission and willing to participate: Yes Program Orientation Provided & Reviewed with Pt/Caregiver Including Roles  & Responsibilities: Yes  Decrease burden of Care through IP rehab admission: n/a  Possible need for SNF placement upon discharge: not anticipated  Patient Condition: I have reviewed medical records from [facility], spoken with CM, and patient. I met with patient at the bedside for inpatient rehabilitation assessment.  Patient will benefit from ongoing PT, OT, and SLP, can actively participate in 3 hours of therapy a day 5 days of the week, and can make measurable gains during the admission.  Patient will also benefit from the coordinated team approach during an Inpatient Acute Rehabilitation admission.  The patient will receive intensive therapy as well as Rehabilitation physician, nursing, social  worker, and care management interventions.  Due to bladder management, bowel management, safety, skin/wound care, disease management, medication administration, pain management, and patient education the patient requires 24 hour a day rehabilitation nursing.  The patient is currently mod assist overall with mobility and basic ADLs.  Discharge setting and therapy post discharge at home with home health is anticipated.  Patient has agreed to participate in the Acute Inpatient Rehabilitation Program and will admit today.  Preadmission Screen Completed By:  Cleatrice Burke, 02/10/2021 10:09 AM ______________________________________________________________________   Discussed status with Dr. Naaman Plummer on 02/10/2021 at 1009 and received approval for admission today.  Admission Coordinator:  Cleatrice Burke, RN, time  1009 Date 02/10/2021   Assessment/Plan: Diagnosis: Right PLIC infarct Does the need for close, 24 hr/day Medical supervision in concert with the patient's rehab needs make it unreasonable for this patient to be served in a less intensive setting? Yes Co-Morbidities requiring supervision/potential complications: migraines, dm, obesity Due to bladder management, bowel management, safety, skin/wound care, disease management, medication administration, pain management, and patient education, does the patient require 24 hr/day rehab nursing? Yes Does the patient require coordinated care of a physician, rehab nurse, PT, OT, and SLP to address physical and functional deficits in the context of the above medical diagnosis(es)? Yes Addressing deficits in the following areas: balance,  endurance, locomotion, strength, transferring, bowel/bladder control, bathing, dressing, feeding, grooming, toileting, cognition, speech, and psychosocial support Can the patient actively participate in an intensive therapy program of at least 3 hrs of therapy 5 days a week? Yes The potential for patient to  make measurable gains while on inpatient rehab is excellent Anticipated functional outcomes upon discharge from inpatient rehab: modified independent and supervision PT, modified independent and supervision OT, supervision SLP Estimated rehab length of stay to reach the above functional goals is: 7-10 days Anticipated discharge destination: Home 10. Overall Rehab/Functional Prognosis: excellent   MD Signature: Meredith Staggers, MD, [place] Director Rehabilitation Services 02/10/2021

## 2021-02-09 NOTE — Evaluation (Signed)
Physical Therapy Evaluation Patient Details Name: Lindsay Rice MRN: [id number] DOB: 08/10/1950 Today's Date: 02/09/2021  History of Present Illness  Pt is a 70 y/o female admitted 12/20 with acute onset L sided weakness. 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   Clinical Impression  Pt admitted with above diagnosis. PTA pt lived at home with her husband, independent. On eval, she required min assist bed mobility, mod assist transfers, and mod assist ambulation 5' with RW. She presents with L side weakness and balance deficits. Pt will benefit from skilled PT to increase their independence and safety with mobility to allow discharge to the venue listed below.          Recommendations for follow up therapy are one component of a multi-disciplinary discharge planning process, led by the attending physician.  Recommendations may be updated based on patient status, additional functional criteria and insurance authorization.  Follow Up Recommendations Acute inpatient rehab (3hours/day)    Assistance Recommended at Discharge Frequent or constant Supervision/Assistance  Functional Status Assessment Patient has had a recent decline in their functional status and demonstrates the ability to make significant improvements in function in a reasonable and predictable amount of time.  Equipment Recommendations  Other (comment) (TBD)    Recommendations for Other Services       Precautions / Restrictions Precautions Precautions: Fall;Other (comment) Precaution Comments: L weakness      Mobility  Bed Mobility Overal bed mobility: Needs Assistance Bed Mobility: Supine to Sit     Supine to sit: Min assist;HOB elevated     General bed mobility comments: +rail, increased time, assist to elevate trunk and scoot to EOB    Transfers Overall transfer level: Needs assistance Equipment used: Rolling walker (2 wheels) Transfers: Sit to/from  Stand Sit to Stand: Mod assist           General transfer comment: assist to power up and stabilize balance. Assist to maintain L grip on RW.    Ambulation/Gait Ambulation/Gait assistance: Mod assist Gait Distance (Feet): 5 Feet Assistive device: Rolling walker (2 wheels) Gait Pattern/deviations: Step-to pattern;Decreased stride length;Decreased weight shift to left Gait velocity: decreased     General Gait Details: cues for sequencing, assist to maintain balance and L grip on RW, cues to look up as pt tends to watch her feet  Stairs            Wheelchair Mobility    Modified Rankin (Stroke Patients Only) Modified Rankin (Stroke Patients Only) Pre-Morbid Rankin Score: No symptoms Modified Rankin: Moderately severe disability     Balance Overall balance assessment: Needs assistance Sitting-balance support: Feet supported;No upper extremity supported;Single extremity supported Sitting balance-Leahy Scale: Fair Sitting balance - Comments: Posterior lean with initial sit. Able to stabilize and maintain min guard assist. Postural control: Posterior lean Standing balance support: Single extremity supported;Bilateral upper extremity supported;Reliant on assistive device for balance Standing balance-Leahy Scale: Poor Standing balance comment: increased time to stabilize static balance with initial stance                             Pertinent Vitals/Pain Pain Assessment: No/denies pain    Home Living Family/patient expects to be discharged to:: Private residence Living Arrangements: Spouse/significant other Available Help at Discharge: Family;Available 24 hours/day Type of Home: House Home Access: Stairs to enter   Entrance Stairs-Number of Steps: 3 Alternate Level Stairs-Number of Steps: flight Home Layout: Two  level;Laundry or work area in basement;Able to live on main level with bedroom/bathroom Home Equipment: Agricultural consultant (2 wheels);Rollator (4  wheels);Cane - single point;Shower seat - built in      Prior Function Prior Level of Function : Independent/Modified Independent             Mobility Comments: Active. Walked her dog multiple times/day       Hand Dominance   Dominant Hand: Right    Extremity/Trunk Assessment   Upper Extremity Assessment Upper Extremity Assessment: Defer to OT evaluation    Lower Extremity Assessment Lower Extremity Assessment: LLE deficits/detail LLE Deficits / Details: grossly 3/5, numbness distally LLE Sensation: decreased proprioception    Cervical / Trunk Assessment Cervical / Trunk Assessment: Kyphotic  Communication   Communication: No difficulties  Cognition Arousal/Alertness: Awake/alert Behavior During Therapy: WFL for tasks assessed/performed Overall Cognitive Status: Within Functional Limits for tasks assessed                                 General Comments: highly motivated        General Comments General comments (skin integrity, edema, etc.): VSS on RA    Exercises     Assessment/Plan    PT Assessment Patient needs continued PT services  PT Problem List Decreased strength;Decreased mobility;Decreased activity tolerance;Decreased balance;Decreased knowledge of use of DME;Impaired sensation       PT Treatment Interventions DME instruction;Therapeutic activities;Gait training;Therapeutic exercise;Patient/family education;Balance training;Stair training;Functional mobility training;Neuromuscular re-education    PT Goals (Current goals can be found in the Care Plan section)  Acute Rehab PT Goals Patient Stated Goal: return to PLOF PT Goal Formulation: With patient Time For Goal Achievement: 02/23/21 Potential to Achieve Goals: Good    Frequency Min 4X/week   Barriers to discharge        Co-evaluation               AM-PAC PT "6 Clicks" Mobility  Outcome Measure Help needed turning from your back to your side while in a flat bed  without using bedrails?: A Little Help needed moving from lying on your back to sitting on the side of a flat bed without using bedrails?: A Lot Help needed moving to and from a bed to a chair (including a wheelchair)?: A Lot Help needed standing up from a chair using your arms (e.g., wheelchair or bedside chair)?: A Lot Help needed to walk in hospital room?: A Lot Help needed climbing 3-5 steps with a railing? : Total 6 Click Score: 12    End of Session Equipment Utilized During Treatment: Gait belt Activity Tolerance: Patient tolerated treatment well Patient left: in chair;with call bell/phone within reach Nurse Communication: Mobility status PT Visit Diagnosis: Unsteadiness on feet (R26.81);Hemiplegia and hemiparesis;Other abnormalities of gait and mobility (R26.89) Hemiplegia - Right/Left: Left Hemiplegia - dominant/non-dominant: Non-dominant Hemiplegia - caused by: Cerebral infarction    Time: 1610-9604 PT Time Calculation (min) (ACUTE ONLY): 26 min   Charges:   PT Evaluation $PT Eval Moderate Complexity: 1 Mod PT Treatments $Gait Training: 8-22 mins        Lindsay Rice, PT  Office # [phone] Pager [phone]   Lindsay Rice 02/09/2021, 12:02 PM

## 2021-02-09 NOTE — Progress Notes (Addendum)
Inpatient Rehabilitation Admissions Coordinator   I met at bedside with patient for a bedside assessment. We discussed goals and expectations of a possible [facility] admit. She prefers [facility] and is in agreement. I await medical workup completion and then can plan admit.  Danne Baxter, RN, MSN Rehab Admissions Coordinator [phone] 02/09/2021 12:37 PM   Dr Leonie Man states medical workup complete. I will plan admit to [facility] Friday.  Danne Baxter, RN, MSN Rehab Admissions Coordinator [phone] 02/09/2021 1:26 PM

## 2021-02-09 NOTE — Progress Notes (Signed)
Inpatient Rehab Admissions Coordinator Note:   Per OT recommendations, patient was screened for [facility] candidacy by Stephania Fragmin, PT. At this time, pt appears to be a potential candidate for [facility]. I will place an order for rehab consult for full assessment, per our protocol.  Please contact me any with questions.Estill Dooms, PT, DPT [phone] 02/09/21 11:48 AM

## 2021-02-10 ENCOUNTER — Encounter (HOSPITAL_COMMUNITY): Payer: Self-pay | Admitting: Physical Medicine and Rehabilitation

## 2021-02-10 ENCOUNTER — Other Ambulatory Visit: Payer: Self-pay

## 2021-02-10 ENCOUNTER — Inpatient Hospital Stay (HOSPITAL_COMMUNITY)
Admission: RE | Admit: 2021-02-10 | Discharge: 2021-02-21 | DRG: 057 | Disposition: A | Discharge status: Home Health | Payer: Medicare Other | Source: Intra-hospital | Attending: Physical Medicine and Rehabilitation | Admitting: Physical Medicine and Rehabilitation

## 2021-02-10 DIAGNOSIS — Z887 Allergy status to serum and vaccine status: Secondary | ICD-10-CM | POA: Diagnosis not present

## 2021-02-10 DIAGNOSIS — K5900 Constipation, unspecified: Secondary | ICD-10-CM | POA: Diagnosis present

## 2021-02-10 DIAGNOSIS — I69354 Hemiplegia and hemiparesis following cerebral infarction affecting left non-dominant side: Principal | ICD-10-CM

## 2021-02-10 DIAGNOSIS — I341 Nonrheumatic mitral (valve) prolapse: Secondary | ICD-10-CM | POA: Diagnosis present

## 2021-02-10 DIAGNOSIS — E1169 Type 2 diabetes mellitus with other specified complication: Secondary | ICD-10-CM

## 2021-02-10 DIAGNOSIS — I6931 Attention and concentration deficit following cerebral infarction: Secondary | ICD-10-CM

## 2021-02-10 DIAGNOSIS — Z7984 Long term (current) use of oral hypoglycemic drugs: Secondary | ICD-10-CM | POA: Diagnosis not present

## 2021-02-10 DIAGNOSIS — G43909 Migraine, unspecified, not intractable, without status migrainosus: Secondary | ICD-10-CM | POA: Diagnosis present

## 2021-02-10 DIAGNOSIS — Z6833 Body mass index (BMI) 33.0-33.9, adult: Secondary | ICD-10-CM | POA: Diagnosis not present

## 2021-02-10 DIAGNOSIS — E039 Hypothyroidism, unspecified: Secondary | ICD-10-CM | POA: Diagnosis present

## 2021-02-10 DIAGNOSIS — Z888 Allergy status to other drugs, medicaments and biological substances status: Secondary | ICD-10-CM

## 2021-02-10 DIAGNOSIS — Z881 Allergy status to other antibiotic agents status: Secondary | ICD-10-CM | POA: Diagnosis not present

## 2021-02-10 DIAGNOSIS — N179 Acute kidney failure, unspecified: Secondary | ICD-10-CM | POA: Diagnosis present

## 2021-02-10 DIAGNOSIS — F419 Anxiety disorder, unspecified: Secondary | ICD-10-CM | POA: Diagnosis present

## 2021-02-10 DIAGNOSIS — Z79899 Other long term (current) drug therapy: Secondary | ICD-10-CM

## 2021-02-10 DIAGNOSIS — E785 Hyperlipidemia, unspecified: Secondary | ICD-10-CM | POA: Diagnosis present

## 2021-02-10 DIAGNOSIS — G43709 Chronic migraine without aura, not intractable, without status migrainosus: Secondary | ICD-10-CM

## 2021-02-10 DIAGNOSIS — Z880 Allergy status to penicillin: Secondary | ICD-10-CM

## 2021-02-10 DIAGNOSIS — E119 Type 2 diabetes mellitus without complications: Secondary | ICD-10-CM | POA: Diagnosis present

## 2021-02-10 DIAGNOSIS — Z7982 Long term (current) use of aspirin: Secondary | ICD-10-CM

## 2021-02-10 DIAGNOSIS — Z79890 Hormone replacement therapy (postmenopausal): Secondary | ICD-10-CM | POA: Diagnosis not present

## 2021-02-10 DIAGNOSIS — I10 Essential (primary) hypertension: Secondary | ICD-10-CM | POA: Diagnosis present

## 2021-02-10 DIAGNOSIS — M25511 Pain in right shoulder: Secondary | ICD-10-CM | POA: Diagnosis present

## 2021-02-10 DIAGNOSIS — I639 Cerebral infarction, unspecified: Secondary | ICD-10-CM

## 2021-02-10 DIAGNOSIS — Z91013 Allergy to seafood: Secondary | ICD-10-CM

## 2021-02-10 DIAGNOSIS — Z8669 Personal history of other diseases of the nervous system and sense organs: Secondary | ICD-10-CM | POA: Diagnosis not present

## 2021-02-10 DIAGNOSIS — G4733 Obstructive sleep apnea (adult) (pediatric): Secondary | ICD-10-CM | POA: Diagnosis present

## 2021-02-10 DIAGNOSIS — E669 Obesity, unspecified: Secondary | ICD-10-CM

## 2021-02-10 HISTORY — DX: Nonrheumatic mitral (valve) prolapse: I34.1

## 2021-02-10 HISTORY — DX: Type 2 diabetes mellitus without complications: E11.9

## 2021-02-10 HISTORY — DX: Obesity, class 1: E66.811

## 2021-02-10 HISTORY — DX: Essential (primary) hypertension: I10

## 2021-02-10 HISTORY — DX: Migraine, unspecified, not intractable, without status migrainosus: G43.909

## 2021-02-10 HISTORY — DX: Sleep apnea, unspecified: G47.30

## 2021-02-10 HISTORY — DX: Obesity, unspecified: E66.9

## 2021-02-10 LAB — GLUCOSE, CAPILLARY
Glucose-Capillary: 158 mg/dL — ABNORMAL HIGH (ref 70–99)
Glucose-Capillary: 158 mg/dL — ABNORMAL HIGH (ref 70–99)
Glucose-Capillary: 163 mg/dL — ABNORMAL HIGH (ref 70–99)
Glucose-Capillary: 142 mg/dL — ABNORMAL HIGH (ref 70–99)
Glucose-Capillary: 219 mg/dL — ABNORMAL HIGH (ref 70–99)

## 2021-02-10 MED ORDER — EMPAGLIFLOZIN 25 MG PO TABS
25.0000 mg | ORAL_TABLET | Freq: Every day | ORAL | Status: DC
  Filled 2021-02-10: qty 1

## 2021-02-10 MED ORDER — ACETAMINOPHEN 325 MG PO TABS: 650.0000 mg | ORAL_TABLET | ORAL | 0 refills | Status: DC | PRN

## 2021-02-10 MED ORDER — CLOPIDOGREL BISULFATE 75 MG PO TABS
75.0000 mg | ORAL_TABLET | Freq: Every day | ORAL | Status: DC
  Administered 2021-02-10 – 2021-02-14 (×5): 75 mg via ORAL
  Filled 2021-02-10 (×6): qty 1

## 2021-02-10 MED ORDER — CLOPIDOGREL BISULFATE 75 MG PO TABS: 75.0000 mg | ORAL_TABLET | Freq: Every day | ORAL | 1 refills | Status: DC

## 2021-02-10 MED ORDER — ENOXAPARIN SODIUM 40 MG/0.4ML IJ SOSY: 40.0000 mg | PREFILLED_SYRINGE | INTRAMUSCULAR | Status: DC

## 2021-02-10 MED ORDER — SERTRALINE HCL 50 MG PO TABS
50.0000 mg | ORAL_TABLET | Freq: Every day | ORAL | Status: DC
  Administered 2021-02-10 – 2021-02-20 (×11): 50 mg via ORAL
  Filled 2021-02-10 (×11): qty 1

## 2021-02-10 MED ORDER — ACETAMINOPHEN 650 MG RE SUPP
650.0000 mg | RECTAL | Status: DC | PRN
  Filled 2021-02-10: qty 1

## 2021-02-10 MED ORDER — BUSPIRONE HCL 5 MG PO TABS
5.0000 mg | ORAL_TABLET | Freq: Two times a day (BID) | ORAL | Status: DC
  Administered 2021-02-10 – 2021-02-21 (×22): 5 mg via ORAL
  Filled 2021-02-10 (×22): qty 1

## 2021-02-10 MED ORDER — PANTOPRAZOLE SODIUM 40 MG PO TBEC
40.0000 mg | DELAYED_RELEASE_TABLET | Freq: Every day | ORAL | Status: DC
  Administered 2021-02-10: 11:00:00 40 mg via ORAL
  Filled 2021-02-10: qty 1

## 2021-02-10 MED ORDER — FAMOTIDINE 20 MG PO TABS
40.0000 mg | ORAL_TABLET | Freq: Every day | ORAL | Status: DC
  Administered 2021-02-10 – 2021-02-20 (×10): 40 mg via ORAL
  Filled 2021-02-10 (×12): qty 2

## 2021-02-10 MED ORDER — ACETAMINOPHEN 325 MG PO TABS
650.0000 mg | ORAL_TABLET | ORAL | Status: DC | PRN
  Administered 2021-02-11: 09:00:00 650 mg via ORAL
  Filled 2021-02-10: qty 2

## 2021-02-10 MED ORDER — EMPAGLIFLOZIN 25 MG PO TABS
25.0000 mg | ORAL_TABLET | Freq: Every day | ORAL | Status: DC
  Administered 2021-02-11 – 2021-02-21 (×11): 25 mg via ORAL
  Filled 2021-02-10 (×11): qty 1

## 2021-02-10 MED ORDER — ASPIRIN 81 MG PO CHEW
81.0000 mg | CHEWABLE_TABLET | Freq: Every day | ORAL | Status: DC
  Administered 2021-02-10 – 2021-02-21 (×12): 81 mg via ORAL
  Filled 2021-02-10 (×13): qty 1

## 2021-02-10 MED ORDER — PANTOPRAZOLE SODIUM 40 MG PO TBEC: 40.0000 mg | DELAYED_RELEASE_TABLET | Freq: Every day | ORAL | 1 refills | Status: DC

## 2021-02-10 MED ORDER — ENOXAPARIN SODIUM 40 MG/0.4ML IJ SOSY
40.0000 mg | PREFILLED_SYRINGE | INTRAMUSCULAR | Status: DC
  Administered 2021-02-10 – 2021-02-20 (×11): 40 mg via SUBCUTANEOUS
  Filled 2021-02-10 (×11): qty 0.4

## 2021-02-10 MED ORDER — ASPIRIN 81 MG PO CHEW: 81.0000 mg | CHEWABLE_TABLET | Freq: Every day | ORAL | 1 refills | Status: DC

## 2021-02-10 MED ORDER — BUTALBITAL-APAP-CAFFEINE 50-325-40 MG PO TABS: 1.0000 | ORAL_TABLET | Freq: Four times a day (QID) | ORAL | Status: DC | PRN

## 2021-02-10 MED ORDER — INSULIN ASPART 100 UNIT/ML IJ SOLN
0.0000 [IU] | Freq: Three times a day (TID) | INTRAMUSCULAR | Status: DC
Start: 2021-02-10 — End: 2021-02-21
  Administered 2021-02-10: 18:00:00 1 [IU] via SUBCUTANEOUS
  Administered 2021-02-11: 07:00:00 2 [IU] via SUBCUTANEOUS
  Administered 2021-02-11: 13:00:00 1 [IU] via SUBCUTANEOUS
  Administered 2021-02-11: 18:00:00 2 [IU] via SUBCUTANEOUS
  Administered 2021-02-12: 08:00:00 3 [IU] via SUBCUTANEOUS
  Administered 2021-02-12: 12:00:00 1 [IU] via SUBCUTANEOUS
  Administered 2021-02-13: 12:00:00 2 [IU] via SUBCUTANEOUS
  Administered 2021-02-13: 07:00:00 1 [IU] via SUBCUTANEOUS
  Administered 2021-02-13: 18:00:00 2 [IU] via SUBCUTANEOUS
  Administered 2021-02-14 – 2021-02-15 (×2): 1 [IU] via SUBCUTANEOUS
  Administered 2021-02-15 – 2021-02-16 (×2): 2 [IU] via SUBCUTANEOUS
  Administered 2021-02-16 – 2021-02-18 (×4): 1 [IU] via SUBCUTANEOUS
  Administered 2021-02-19: 2 [IU] via SUBCUTANEOUS
  Administered 2021-02-21: 1 [IU] via SUBCUTANEOUS

## 2021-02-10 MED ORDER — GABAPENTIN 300 MG PO CAPS
300.0000 mg | ORAL_CAPSULE | Freq: Two times a day (BID) | ORAL | Status: DC
  Administered 2021-02-10 – 2021-02-21 (×22): 300 mg via ORAL
  Filled 2021-02-10 (×23): qty 1

## 2021-02-10 MED ORDER — ACETAMINOPHEN 160 MG/5ML PO SOLN: 650.0000 mg | ORAL | Status: DC | PRN

## 2021-02-10 MED ORDER — SERTRALINE HCL 50 MG PO TABS: 50.0000 mg | ORAL_TABLET | Freq: Every day | ORAL | Status: DC

## 2021-02-10 MED ORDER — MOMETASONE FURO-FORMOTEROL FUM 200-5 MCG/ACT IN AERO
2.0000 | INHALATION_SPRAY | Freq: Two times a day (BID) | RESPIRATORY_TRACT | Status: DC
  Administered 2021-02-10 – 2021-02-21 (×22): 2 via RESPIRATORY_TRACT
  Filled 2021-02-10: qty 8.8

## 2021-02-10 MED ORDER — ALBUTEROL SULFATE (2.5 MG/3ML) 0.083% IN NEBU: 3.0000 mL | INHALATION_SOLUTION | Freq: Four times a day (QID) | RESPIRATORY_TRACT | Status: DC | PRN

## 2021-02-10 MED ORDER — EMPAGLIFLOZIN 25 MG PO TABS: 25.0000 mg | ORAL_TABLET | Freq: Every day | ORAL | 1 refills | Status: DC

## 2021-02-10 MED ORDER — LEVOTHYROXINE SODIUM 75 MCG PO TABS
75.0000 ug | ORAL_TABLET | Freq: Every day | ORAL | Status: DC
  Administered 2021-02-11 – 2021-02-21 (×11): 75 ug via ORAL
  Filled 2021-02-10 (×11): qty 1

## 2021-02-10 MED ORDER — SENNOSIDES-DOCUSATE SODIUM 8.6-50 MG PO TABS: 1.0000 | ORAL_TABLET | Freq: Every evening | ORAL | Status: DC | PRN

## 2021-02-10 MED ORDER — DIVALPROEX SODIUM ER 500 MG PO TB24
500.0000 mg | ORAL_TABLET | Freq: Every day | ORAL | Status: DC
  Administered 2021-02-10 – 2021-02-20 (×11): 500 mg via ORAL
  Filled 2021-02-10 (×12): qty 1

## 2021-02-10 MED ORDER — AMLODIPINE BESYLATE 5 MG PO TABS: 5.0000 mg | ORAL_TABLET | Freq: Every day | ORAL | Status: DC

## 2021-02-10 MED ORDER — AMLODIPINE BESYLATE 5 MG PO TABS: 5.0000 mg | ORAL_TABLET | Freq: Every day | ORAL | 1 refills | Status: DC

## 2021-02-10 MED ORDER — ATORVASTATIN CALCIUM 80 MG PO TABS: 80.0000 mg | ORAL_TABLET | Freq: Every day | ORAL | 1 refills | Status: DC

## 2021-02-10 MED ORDER — AMLODIPINE BESYLATE 5 MG PO TABS
5.0000 mg | ORAL_TABLET | Freq: Every day | ORAL | Status: DC
  Administered 2021-02-11 – 2021-02-21 (×11): 5 mg via ORAL
  Filled 2021-02-10 (×12): qty 1

## 2021-02-10 MED ORDER — SERTRALINE HCL 100 MG PO TABS
100.0000 mg | ORAL_TABLET | Freq: Every day | ORAL | Status: DC
  Administered 2021-02-11 – 2021-02-21 (×11): 100 mg via ORAL
  Filled 2021-02-10 (×12): qty 1

## 2021-02-10 MED ORDER — ATORVASTATIN CALCIUM 80 MG PO TABS
80.0000 mg | ORAL_TABLET | Freq: Every day | ORAL | Status: DC
  Administered 2021-02-10 – 2021-02-20 (×11): 80 mg via ORAL
  Filled 2021-02-10 (×11): qty 1

## 2021-02-10 MED ORDER — SENNOSIDES-DOCUSATE SODIUM 8.6-50 MG PO TABS: 1.0000 | ORAL_TABLET | Freq: Every evening | ORAL | 0 refills | Status: AC | PRN

## 2021-02-10 MED ORDER — SERTRALINE HCL 100 MG PO TABS: 100.0000 mg | ORAL_TABLET | Freq: Every day | ORAL | Status: DC

## 2021-02-10 MED ORDER — AMITRIPTYLINE HCL 25 MG PO TABS
25.0000 mg | ORAL_TABLET | Freq: Every day | ORAL | Status: DC
  Administered 2021-02-10 – 2021-02-20 (×11): 25 mg via ORAL
  Filled 2021-02-10 (×11): qty 1

## 2021-02-10 NOTE — Discharge Summary (Addendum)
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 Principal Problem:   Stroke determined by clinical assessment (HCC)   Medications to be continued on Rehab Allergies as of 02/10/2021       Reactions   Iodinated Contrast Media Hives, Shortness Of Breath, Rash, Other (See Comments)   Required an Epi-pen   Iodine Hives, Shortness Of Breath, Rash   Shellfish-derived Products Hives, Shortness Of Breath   Cefaclor Rash   Cheese Other (See Comments)   Headaches/migraines from Swiss and Parmesan only   Covid-19 (mrna) Vaccine Nausea And Vomiting, Other (See Comments)   Severe Weakness, also   Levofloxacin Hives, Rash   Penicillins Hives, Rash   Wound Dressing Adhesive Other (See Comments)   Paper Tape ok        Medication List     STOP taking these medications    aspirin EC 81 MG tablet Replaced by: aspirin 81 MG chewable tablet   famotidine 40 MG tablet Commonly known as: PEPCID   omeprazole 40 MG capsule Commonly known as: PRILOSEC   simvastatin 40 MG tablet Commonly known as: ZOCOR       TAKE these medications    acetaminophen 325 MG tablet Commonly known as: TYLENOL Take 2 tablets (650 mg total) by mouth every 4 (four) hours as needed for mild pain (or temp > 37.5 C (99.5 F)). What changed:  medication strength how much to take when to take this reasons to take this   Ajovy 225 MG/1.5ML Sosy Generic drug: Fremanezumab-vfrm Inject 225 mg into the skin every 30 (thirty) days.   albuterol 108 (90 Base) MCG/ACT inhaler Commonly known as: VENTOLIN HFA Inhale 2 puffs into the lungs every 6 (six) hours as needed for wheezing or shortness of  breath.   amitriptyline 25 MG tablet Commonly known as: ELAVIL Take 25 mg by mouth at bedtime.   amLODipine 5 MG tablet Commonly known as: NORVASC Take 1 tablet (5 mg total) by mouth daily. Start taking on: February 11, 2021 What changed:  medication strength how much to take   aspirin 81 MG chewable tablet Chew 1 tablet (81 mg total) by mouth daily. Start taking on: February 11, 2021 Replaces: aspirin EC 81 MG tablet   atorvastatin 80 MG tablet Commonly known as: LIPITOR Take 1 tablet (80 mg total) by mouth at bedtime.   busPIRone 5 MG tablet Commonly known as: BUSPAR Take 5 mg by mouth in the morning and at bedtime.   butalbital-acetaminophen-caffeine 50-325-40 MG tablet Commonly known as: FIORICET Take 1 tablet by mouth in the morning and at bedtime.   clopidogrel 75 MG tablet Commonly known as: PLAVIX Take 1 tablet (75 mg total) by mouth daily. Start taking on: February 11, 2021   divalproex 500 MG 24 hr tablet Commonly known as: DEPAKOTE ER Take 500 mg by mouth at bedtime.   empagliflozin 25 MG Tabs tablet Commonly known as: JARDIANCE Take 1 tablet (25 mg total) by mouth daily. Start taking on: February 11, 2021   EPINEPHrine 0.3 mg/0.3 mL Soaj injection Commonly known as: EPI-PEN Inject 0.3 mg into the muscle once as needed for anaphylaxis.  fluticasone-salmeterol 115-21 MCG/ACT inhaler Commonly known as: ADVAIR HFA Inhale 2 puffs into the lungs 2 (two) times daily as needed (for cold-induced asthma symptoms).   gabapentin 300 MG capsule Commonly known as: NEURONTIN Take 300 mg by mouth in the morning and at bedtime.   hydrochlorothiazide 25 MG tablet Commonly known as: HYDRODIURIL Take 25 mg by mouth daily.   levothyroxine 75 MCG tablet Commonly known as: SYNTHROID Take 75 mcg by mouth daily before breakfast.   losartan 100 MG tablet Commonly known as: COZAAR Take 100 mg by mouth in the morning.   metoprolol tartrate 100 MG tablet Commonly  known as: LOPRESSOR Take 50-100 mg by mouth See admin instructions. Take 100 mg by mouth in the morning and 50 mg at bedtime   montelukast 10 MG tablet Commonly known as: SINGULAIR Take 10 mg by mouth at bedtime.   ondansetron 4 MG disintegrating tablet Commonly known as: ZOFRAN-ODT Take 4 mg by mouth every 8 (eight) hours as needed for nausea or vomiting (dissolve orally).   pantoprazole 40 MG tablet Commonly known as: PROTONIX Take 1 tablet (40 mg total) by mouth daily. Start taking on: February 11, 2021   promethazine 25 MG suppository Commonly known as: PHENERGAN Place 25 mg rectally every 6 (six) hours as needed for nausea or vomiting.   senna-docusate 8.6-50 MG tablet Commonly known as: Senokot-S Take 1 tablet by mouth at bedtime as needed for moderate constipation or mild constipation.   sertraline 100 MG tablet Commonly known as: ZOLOFT Take 50-100 mg by mouth See admin instructions. Take 100 mg by mouth in the morning 50 mg at bedtime   triamcinolone cream 0.1 % Commonly known as: KENALOG Apply 1 application topically 2 (two) times daily as needed (to affected areas).        LABORATORY STUDIES CBC    Component Value Date/Time   WBC 10.7 (H) 02/08/2021 0500   RBC 4.25 02/08/2021 0500   HGB 11.9 (L) 02/08/2021 0500   HCT 36.1 02/08/2021 0500   PLT 263 02/08/2021 0500   MCV 84.9 02/08/2021 0500   MCH 28.0 02/08/2021 0500   MCHC 33.0 02/08/2021 0500   RDW 13.5 02/08/2021 0500   LYMPHSABS 1.9 02/07/2021 2022   MONOABS 0.4 02/07/2021 2022   EOSABS 0.4 02/07/2021 2022   BASOSABS 0.1 02/07/2021 2022   CMP    Component Value Date/Time   NA 135 02/07/2021 2029   K 4.1 02/07/2021 2029   CL 101 02/07/2021 2029   CO2 23 02/07/2021 2022   GLUCOSE 382 (H) 02/07/2021 2029   BUN 18 02/07/2021 2029   CREATININE 0.93 02/08/2021 0500   CALCIUM 9.3 02/07/2021 2022   PROT 7.1 02/07/2021 2022   ALBUMIN 3.6 02/07/2021 2022   AST 26 02/07/2021 2022   ALT 27  02/07/2021 2022   ALKPHOS 70 02/07/2021 2022   BILITOT 0.6 02/07/2021 2022   GFRNONAA >60 02/08/2021 0500   COAGS Lab Results  Component Value Date   INR 1.0 02/07/2021   Lipid Panel    Component Value Date/Time   CHOL 159 02/08/2021 0500   TRIG 189 (H) 02/08/2021 0500   HDL 39 (L) 02/08/2021 0500   CHOLHDL 4.1 02/08/2021 0500   VLDL 38 02/08/2021 0500   LDLCALC 82 02/08/2021 0500   HgbA1C  Lab Results  Component Value Date   HGBA1C 8.3 (H) 02/08/2021   Urinalysis No results found for: COLORURINE, APPEARANCEUR, LABSPEC, PHURINE, GLUCOSEU, HGBUR, BILIRUBINUR, KETONESUR, PROTEINUR, UROBILINOGEN, NITRITE, LEUKOCYTESUR Urine Drug Screen  No results found for: LABOPIA, COCAINSCRNUR, LABBENZ, AMPHETMU, THCU, LABBARB  Alcohol Level No results found for: ETH   SIGNIFICANT DIAGNOSTIC STUDIES CT HEAD WO CONTRAST ( )  Result Date: 02/08/2021 CLINICAL DATA:  Neuro deficit, stroke suspected. EXAM: CT HEAD WITHOUT CONTRAST TECHNIQUE: Contiguous axial images were obtained from the base of the skull through the vertex without intravenous contrast. COMPARISON:  Plain brain CT and MR head, dated February 07, 2021 FINDINGS: Brain: There is mild cerebral atrophy with widening of the extra-axial spaces and ventricular dilatation. There are areas of decreased attenuation within the white matter tracts of the supratentorial brain, consistent with microvascular disease changes. A an ill-defined, approximately 8 mm diameter area of white matter low attenuation is seen along the para thalamic region on the right. This is slightly more prominent on the current exam when compared to the prior study and corresponds to findings seen on the prior MRI. There is no evidence of associated mass effect or midline shift. Vascular: No hyperdense vessel or unexpected calcification. Skull: Normal. Negative for fracture or focal lesion. Sinuses/Orbits: No acute finding. Other: None. IMPRESSION: Small, ill-defined  subacute infarct along the para thalamic region on the right. This is slightly more prominent on the current exam when compared to the prior study and corresponds to findings seen on the prior MRI. Electronically Signed   By: Aram Candela M.D.   On: 02/08/2021 21:13   MR ANGIO HEAD WO CONTRAST  Result Date: 02/07/2021 CLINICAL DATA:  Initial evaluation for neuro deficit, stroke suspected. EXAM: MRI HEAD WITHOUT CONTRAST MRA HEAD WITHOUT CONTRAST MRA NECK WITHOUT AND WITH CONTRAST TECHNIQUE: Multiplanar, multi-echo pulse sequences of the brain and surrounding structures were acquired without intravenous contrast. Angiographic images of the Circle of Willis were acquired using MRA technique without intravenous contrast. Angiographic images of the neck were acquired using MRA technique without and with intravenous contrast. Carotid stenosis measurements (when applicable) are obtained utilizing NASCET criteria, using the distal internal carotid diameter as the denominator. CONTRAST:  7mL GADAVIST GADOBUTROL 1 MMOL/ML IV SOLN COMPARISON:  Head CT from earlier the same day. FINDINGS: MRI HEAD FINDINGS Brain: Cerebral volume within normal limits. Minimal chronic microvascular ischemic disease noted involving the pons. 7 mm focus of restricted diffusion seen involving the posterior limb of the right internal capsule, consistent with an acute ischemic infarct (series 5, image 75). No associated hemorrhage or mass effect. No other evidence for acute or subacute ischemia. No other areas of chronic cortical infarction. No evidence for acute or chronic intracranial hemorrhage. No mass lesion or midline shift. No hydrocephalus or extra-axial fluid collection. Pituitary gland suprasellar region normal. Vascular: Major intracranial vascular flow voids are well maintained. Skull and upper cervical spine: Craniocervical junction within normal limits. Bone marrow signal intensity normal. No scalp soft tissue abnormality.  Sinuses/Orbits: Patient status post bilateral ocular lens replacement. Paranasal sinuses are largely clear. No significant mastoid effusion. Inner ear structures grossly normal. Other: None. MRA HEAD FINDINGS Anterior circulation: Examination mildly degraded by motion. Visualized distal cervical segments of the internal carotid arteries are patent with antegrade flow. Petrous, cavernous, and supraclinoid segments patent without stenosis or other abnormality. Eight A1 segments widely patent. Normal anterior communicating artery complex. Anterior cerebral arteries patent without stenosis. No M1 stenosis or occlusion. Normal MCA bifurcations. Distal MCA branches perfused and symmetric. Posterior circulation: Both V4 segments patent without stenosis. Right vertebral artery dominant. Both PICA patent at their origins. Basilar widely patent to its distal aspect. Superior cerebellar and posterior cerebral  arteries widely patent bilaterally. Anatomic variants: None significant.  No aneurysm. MRA NECK FINDINGS Aortic arch: Examination degraded by motion artifact. Visualized aortic arch normal caliber with normal branch pattern. No hemodynamically significant stenosis seen about the origin the great vessels. Right carotid system: Right common and internal carotid arteries widely patent without stenosis, evidence for dissection or occlusion. Left carotid system: Left common and internal carotid arteries widely patent without stenosis, evidence for dissection or occlusion. Vertebral arteries: Both vertebral arteries arise from the subclavian arteries. No proximal subclavian artery stenosis. Both vertebral arteries widely patent without stenosis, evidence for dissection or occlusion. Right vertebral artery dominant. Other: None IMPRESSION: MRI HEAD IMPRESSION: 1. 7 mm acute ischemic nonhemorrhagic infarct involving the posterior limb of the right internal capsule. 2. 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 MRA HEAD IMPRESSION: Normal intracranial MRA. No large vessel occlusion, hemodynamically significant stenosis, or other acute vascular abnormality. MRA NECK IMPRESSION: Normal MRA of the neck. Electronically Signed   By: Rise Mu M.D.   On: 02/07/2021 23:21   MR ANGIO NECK W WO CONTRAST  Result Date: 02/07/2021 CLINICAL DATA:  Initial evaluation for neuro deficit, stroke suspected. EXAM: MRI HEAD WITHOUT CONTRAST MRA HEAD WITHOUT CONTRAST MRA NECK WITHOUT AND WITH CONTRAST TECHNIQUE: Multiplanar, multi-echo pulse sequences of the brain and surrounding structures were acquired without intravenous contrast. Angiographic images of the Circle of Willis were acquired using MRA technique without intravenous contrast. Angiographic images of the neck were acquired using MRA technique without and with intravenous contrast. Carotid stenosis measurements (when applicable) are obtained utilizing NASCET criteria, using the distal internal carotid diameter as the denominator. CONTRAST:  75mL GADAVIST GADOBUTROL 1 MMOL/ML IV SOLN COMPARISON:  Head CT from earlier the same day. FINDINGS: MRI HEAD FINDINGS Brain: Cerebral volume within normal limits. Minimal chronic microvascular ischemic disease noted involving the pons. 7 mm focus of restricted diffusion seen involving the posterior limb of the right internal capsule, consistent with an acute ischemic infarct (series 5, image 75). No associated hemorrhage or mass effect. No other evidence for acute or subacute ischemia. No other areas of chronic cortical infarction. No evidence for acute or chronic intracranial hemorrhage. No mass lesion or midline shift. No hydrocephalus or extra-axial fluid collection. Pituitary gland suprasellar region normal. Vascular: Major intracranial vascular flow voids are well maintained. Skull and upper cervical spine: Craniocervical junction within normal limits. Bone marrow signal intensity normal. No scalp soft tissue  abnormality. Sinuses/Orbits: Patient status post bilateral ocular lens replacement. Paranasal sinuses are largely clear. No significant mastoid effusion. Inner ear structures grossly normal. Other: None. MRA HEAD FINDINGS Anterior circulation: Examination mildly degraded by motion. Visualized distal cervical segments of the internal carotid arteries are patent with antegrade flow. Petrous, cavernous, and supraclinoid segments patent without stenosis or other abnormality. Eight A1 segments widely patent. Normal anterior communicating artery complex. Anterior cerebral arteries patent without stenosis. No M1 stenosis or occlusion. Normal MCA bifurcations. Distal MCA branches perfused and symmetric. Posterior circulation: Both V4 segments patent without stenosis. Right vertebral artery dominant. Both PICA patent at their origins. Basilar widely patent to its distal aspect. Superior cerebellar and posterior cerebral arteries widely patent bilaterally. Anatomic variants: None significant.  No aneurysm. MRA NECK FINDINGS Aortic arch: Examination degraded by motion artifact. Visualized aortic arch normal caliber with normal branch pattern. No hemodynamically significant stenosis seen about the origin the great vessels. Right carotid system: Right common and internal carotid arteries widely patent without stenosis, evidence for dissection or occlusion. Left  carotid system: Left common and internal carotid arteries widely patent without stenosis, evidence for dissection or occlusion. Vertebral arteries: Both vertebral arteries arise from the subclavian arteries. No proximal subclavian artery stenosis. Both vertebral arteries widely patent without stenosis, evidence for dissection or occlusion. Right vertebral artery dominant. Other: None IMPRESSION: MRI HEAD IMPRESSION: 1. 7 mm acute ischemic nonhemorrhagic infarct involving the posterior limb of the right internal capsule. 2. Mild chronic microvascular ischemic disease  involving the pons. MRA HEAD IMPRESSION: Normal intracranial MRA. No large vessel occlusion, hemodynamically significant stenosis, or other acute vascular abnormality. MRA NECK IMPRESSION: Normal MRA of the neck. Electronically Signed   By: Rise Mu M.D.   On: 02/07/2021 23:21   MR BRAIN WO CONTRAST  Result Date: 02/07/2021 CLINICAL DATA:  Initial evaluation for neuro deficit, stroke suspected. EXAM: MRI HEAD WITHOUT CONTRAST MRA HEAD WITHOUT CONTRAST MRA NECK WITHOUT AND WITH CONTRAST TECHNIQUE: Multiplanar, multi-echo pulse sequences of the brain and surrounding structures were acquired without intravenous contrast. Angiographic images of the Circle of Willis were acquired using MRA technique without intravenous contrast. Angiographic images of the neck were acquired using MRA technique without and with intravenous contrast. Carotid stenosis measurements (when applicable) are obtained utilizing NASCET criteria, using the distal internal carotid diameter as the denominator. CONTRAST:  10mL GADAVIST GADOBUTROL 1 MMOL/ML IV SOLN COMPARISON:  Head CT from earlier the same day. FINDINGS: MRI HEAD FINDINGS Brain: Cerebral volume within normal limits. Minimal chronic microvascular ischemic disease noted involving the pons. 7 mm focus of restricted diffusion seen involving the posterior limb of the right internal capsule, consistent with an acute ischemic infarct (series 5, image 75). No associated hemorrhage or mass effect. No other evidence for acute or subacute ischemia. No other areas of chronic cortical infarction. No evidence for acute or chronic intracranial hemorrhage. No mass lesion or midline shift. No hydrocephalus or extra-axial fluid collection. Pituitary gland suprasellar region normal. Vascular: Major intracranial vascular flow voids are well maintained. Skull and upper cervical spine: Craniocervical junction within normal limits. Bone marrow signal intensity normal. No scalp soft tissue  abnormality. Sinuses/Orbits: Patient status post bilateral ocular lens replacement. Paranasal sinuses are largely clear. No significant mastoid effusion. Inner ear structures grossly normal. Other: None. MRA HEAD FINDINGS Anterior circulation: Examination mildly degraded by motion. Visualized distal cervical segments of the internal carotid arteries are patent with antegrade flow. Petrous, cavernous, and supraclinoid segments patent without stenosis or other abnormality. Eight A1 segments widely patent. Normal anterior communicating artery complex. Anterior cerebral arteries patent without stenosis. No M1 stenosis or occlusion. Normal MCA bifurcations. Distal MCA branches perfused and symmetric. Posterior circulation: Both V4 segments patent without stenosis. Right vertebral artery dominant. Both PICA patent at their origins. Basilar widely patent to its distal aspect. Superior cerebellar and posterior cerebral arteries widely patent bilaterally. Anatomic variants: None significant.  No aneurysm. MRA NECK FINDINGS Aortic arch: Examination degraded by motion artifact. Visualized aortic arch normal caliber with normal branch pattern. No hemodynamically significant stenosis seen about the origin the great vessels. Right carotid system: Right common and internal carotid arteries widely patent without stenosis, evidence for dissection or occlusion. Left carotid system: Left common and internal carotid arteries widely patent without stenosis, evidence for dissection or occlusion. Vertebral arteries: Both vertebral arteries arise from the subclavian arteries. No proximal subclavian artery stenosis. Both vertebral arteries widely patent without stenosis, evidence for dissection or occlusion. Right vertebral artery dominant. Other: None IMPRESSION: MRI HEAD IMPRESSION: 1. 7 mm acute ischemic nonhemorrhagic infarct involving  the posterior limb of the right internal capsule. 2. Mild chronic microvascular ischemic disease  involving the pons. MRA HEAD IMPRESSION: Normal intracranial MRA. No large vessel occlusion, hemodynamically significant stenosis, or other acute vascular abnormality. MRA NECK IMPRESSION: Normal MRA of the neck. Electronically Signed   By: Rise Mu M.D.   On: 02/07/2021 23:21   ECHOCARDIOGRAM COMPLETE  Result Date: 02/08/2021    ECHOCARDIOGRAM REPORT   Patient Name:   Lindsay Rice Date of Exam: 02/08/2021 Medical Rec #:  [id number]      Height:       65.0 in Accession #:    [id number]     Weight:       200.2 lb Date of Birth:  04/25/1950       BSA:          1.979 m Patient Age:    70 years       BP:           137/70 mmHg Patient Gender: F              HR:           95 bpm. Exam Location:  Inpatient Procedure: 2D Echo, Cardiac Doppler and Color Doppler Indications:    TIA  History:        Patient has no prior history of Echocardiogram examinations.  Sonographer:    Roosvelt Maser RDCS Referring Phys: [id number] [facility] IMPRESSIONS  1. Left ventricular ejection fraction, by estimation, is 65 to 70%. The left ventricle has normal function. The left ventricle has no regional wall motion abnormalities. Left ventricular diastolic parameters are consistent with Grade I diastolic dysfunction (impaired relaxation).  2. Right ventricular systolic function is normal. The right ventricular size is normal.  3. The mitral valve is normal in structure. No evidence of mitral valve regurgitation. No evidence of mitral stenosis. Moderate to severe mitral annular calcification.  4. The aortic valve is tricuspid. There is mild thickening of the aortic valve. Aortic valve regurgitation is not visualized. Aortic valve sclerosis is present, with no evidence of aortic valve stenosis.  5. The inferior vena cava is normal in size with greater than 50% respiratory variability, suggesting right atrial pressure of 3 mmHg. FINDINGS  Left Ventricle: Left ventricular ejection fraction, by estimation, is 65 to 70%. The left  ventricle has normal function. The left ventricle has no regional wall motion abnormalities. The left ventricular internal cavity size was normal in size. There is  no left ventricular hypertrophy. Left ventricular diastolic parameters are consistent with Grade I diastolic dysfunction (impaired relaxation). Normal left ventricular filling pressure. Right Ventricle: The right ventricular size is normal. No increase in right ventricular wall thickness. Right ventricular systolic function is normal. Left Atrium: Left atrial size was normal in size. Right Atrium: Right atrial size was normal in size. Pericardium: There is no evidence of pericardial effusion. Mitral Valve: The mitral valve is normal in structure. Moderate to severe mitral annular calcification. No evidence of mitral valve regurgitation. No evidence of mitral valve stenosis. Tricuspid Valve: The tricuspid valve is normal in structure. Tricuspid valve regurgitation is not demonstrated. No evidence of tricuspid stenosis. Aortic Valve: The aortic valve is tricuspid. There is mild thickening of the aortic valve. Aortic valve regurgitation is not visualized. Aortic valve sclerosis is present, with no evidence of aortic valve stenosis. Aortic valve mean gradient measures 5.0  mmHg. Aortic valve peak gradient measures 8.4 mmHg. Aortic valve area, by VTI measures 2.61 cm. Pulmonic  Valve: The pulmonic valve was normal in structure. Pulmonic valve regurgitation is not visualized. No evidence of pulmonic stenosis. Aorta: The aortic root is normal in size and structure. Venous: The inferior vena cava was not well visualized. The inferior vena cava is normal in size with greater than 50% respiratory variability, suggesting right atrial pressure of 3 mmHg. IAS/Shunts: No atrial level shunt detected by color flow Doppler.  LEFT VENTRICLE PLAX 2D LVIDd:         3.50 cm   Diastology LVIDs:         2.40 cm   LV e' medial:    6.20 cm/s LV PW:         1.00 cm   LV E/e'  medial:  7.7 LV IVS:        1.00 cm   LV e' lateral:   7.51 cm/s LVOT diam:     2.00 cm   LV E/e' lateral: 6.3 LV SV:         63 LV SV Index:   32 LVOT Area:     3.14 cm  LEFT ATRIUM           Index        RIGHT ATRIUM           Index LA diam:      3.70 cm 1.87 cm/m   RA Area:     12.30 cm LA Vol (A2C): 34.1 ml 17.23 ml/m  RA Volume:   27.00 ml  13.64 ml/m LA Vol (A4C): 48.8 ml 24.66 ml/m  AORTIC VALVE AV Area (Vmax):    2.60 cm AV Area (Vmean):   2.64 cm AV Area (VTI):     2.61 cm AV Vmax:           145.00 cm/s AV Vmean:          97.700 cm/s AV VTI:            0.242 m AV Peak Grad:      8.4 mmHg AV Mean Grad:      5.0 mmHg LVOT Vmax:         120.00 cm/s LVOT Vmean:        82.000 cm/s LVOT VTI:          0.201 m LVOT/AV VTI ratio: 0.83  AORTA Ao Root diam: 2.90 cm Ao Asc diam:  2.80 cm MITRAL VALVE MV Area (PHT): 4.17 cm    SHUNTS MV Decel Time: 182 msec    Systemic VTI:  0.20 m MV E velocity: 47.67 cm/s  Systemic Diam: 2.00 cm MV A velocity: 99.40 cm/s MV E/A ratio:  0.48 Mihai Croitoru MD Electronically signed by Thurmon Fair MD Signature Date/Time: 02/08/2021/2:53:21 PM    Final    CT HEAD CODE STROKE WO CONTRAST  Result Date: 02/07/2021 CLINICAL DATA:  Code stroke. Initial evaluation for acute stroke. Right-sided facial droop. EXAM: CT HEAD WITHOUT CONTRAST TECHNIQUE: Contiguous axial images were obtained from the base of the skull through the vertex without intravenous contrast. COMPARISON:  CT from 04/22/2015. FINDINGS: Brain: Cerebral volume within normal limits. No acute intracranial hemorrhage. No acute large vessel territory infarct. No mass lesion, midline shift or mass effect. No hydrocephalus or extra-axial fluid collection. Vascular: No hyperdense vessel. Skull: Scalp soft tissues and calvarium within normal limits. Sinuses/Orbits: Globes and orbital soft tissues within normal limits. Paranasal sinuses and mastoid air cells are clear. Other: None. ASPECTS [facility] Stroke Program Early  CT Score) - Ganglionic level infarction (caudate,  lentiform nuclei, internal capsule, insula, M1-M3 cortex): 7 - Supraganglionic infarction (M4-M6 cortex): 3 Total score (0-10 with 10 being normal): 10 IMPRESSION: 1. No acute intracranial abnormality. 2. ASPECTS is 10. Results were communicated by telephone at the time of interpretation on 02/07/2021 at 8:22 pm to provider Dr. Derry Lory by Dr. Whitman Hero. Electronically Signed   By: Rise Mu M.D.   On: 02/07/2021 20:26       HISTORY OF PRESENT ILLNESS Patient with a history of migraines, DM2, HTN, HLD, mitral valve prolapse and OSA presented with acute onset left sided weakness.   HOSPITAL COURSE Code stroke was called and patient was given TNK after head CT revealed no hemorrhage. Mild improvement of symptoms was noted.  MRI revealed an ischemic infarct in the posterior limb of the right internal capsule.  MRA revealed no abnormalities, and echocardiogram revealed no source of embolus.  LDL was 81, so high-intensity statin was started, and Jardiance was stsarted to assist with control of diabetes, as A1C was 8.3.  Sumatriptan for migraines was discontinued.  PT/OT recommended inpatient ehab, and patient will be discharged now that she is stable and workup is complete.  DISCHARGE EXAM Blood pressure (!) 151/81, pulse 98, temperature 98.6 F (37 C), temperature source Oral, resp. rate 20, height 5\' 5"  (1.651 m), weight 90.8 kg, SpO2 96 %.  NEURO:  Mental Status: AA&Ox3  Speech/Language: speech is without  aphasia.  Mild dysarthria. Naming, repetition, fluency, and comprehension intact.  Cranial Nerves:  II: PERRL. Visual fields full.  III, IV, VI: EOMI. Eyelids elevate symmetrically.  V: Sensation is intact to light touch and symmetrical to face.  VII: mild left facial weakness.  VIII: hearing intact to voice. IX, X: Phonation is normal.  XII: tongue is midline without fasciculations. Motor: 5/5 strength to RUE and RLE, 4/5  strength to LUE and LLE Tone: is normal and bulk is normal Sensation- Intact to light touch bilaterally. Extinction absent to light touch to DSS. Marland Kitchen   Coordination: FTN intact bilaterally, HKS: no ataxia in BLE.No drift.  DTRs: 2+ throughout Gait- deferred    Discharge Diet      Diet   Diet Carb Modified Fluid consistency: Thin; Room service appropriate? Yes with Assist   liquids  DISCHARGE PLAN Disposition:  Transfer to [facility] for ongoing PT, OT and ST aspirin 81 mg daily and clopidogrel 75 mg daily for secondary stroke prevention for 3 weeks then clopidogrel alone. Recommend ongoing stroke risk factor control by Primary Care Physician at time of discharge from inpatient rehabilitation. Follow-up PCP Heron Nay, PA in 2 weeks following discharge from rehab. Follow-up in [facility] in 4 weeks following discharge from rehab, office to schedule an appointment.    Cortney E Ernestina Columbia , MSN, AGACNP-BC [facility] See Amion for schedule and pager information 02/10/2021 1:08 PM  ATTENDING ATTESTATION:  Pt with right BG CVA s/p TNK. Going to rehab today.  Dr. Viviann Spare evaluated pt independently, reviewed imaging, chart, labs. Discussed and formulated plan with the APP. Please see APP note above for details.   Total 30 minutes spent on counseling patient and coordinating care, writing notes and reviewing chart and discharge planning.   Reyann Troop,MD

## 2021-02-10 NOTE — Progress Notes (Signed)
Report called to RN for transfer to Rehab today.

## 2021-02-10 NOTE — Discharge Instructions (Addendum)
Lindsay Rice, you were admitted to the hospital with left-sided weakness and were found to have a stroke in the right basal ganglia.  You will be discharged to inpatient rehabilitation. You will take aspirin and Plavix daily for three weeks followed by Plavix alone, and you will remain on a high-intensity statin (atorvastatin 80 mg daily).  If you have any recurrent signs of stroke, you should call 911 and proceed to the emergency department immediately.

## 2021-02-10 NOTE — H&P (Signed)
Physical Medicine and Rehabilitation Admission H&P       HPI: Lindsay Rice is a 70 year old right-handed female with history of migraine headaches maintained on ajovy subcutaneous every 30 days as well as Depakote, diabetes mellitus, obesity with BMI 33.31, hypertension, hyperlipidemia, mitral valve prolapse, OSA.  Per chart review patient lives with spouse.  Two-level home bed and bath main level 3 steps to entry.  Independent prior to admission.  Presented 02/07/2021 with acute onset of left-sided weakness while getting out of her car resulting in a fall.  She was able to get into her home and went to the bathroom and fell again without loss of consciousness.  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  MRA of the head unremarkable no large vessel occlusion.  MRA of the neck negative without dissection or occlusion.  Patient did receive TNKase.  Admission chemistries unremarkable except glucose 373, creatinine 1.01, WBC 10,600, hemoglobin A1c 8.3.  Echocardiogram with ejection fraction of 65 to 70% no wall motion abnormalities grade 1 diastolic dysfunction.  Neurology follow-up maintained on aspirin 81 mg daily and Plavix 75 mg daily x3 weeks followed by Plavix alone.  Subcutaneous Lovenox for DVT prophylaxis.  Tolerating a regular consistency diet.  Therapy evaluations completed due to patient's left-sided weakness decreased functional mobility was admitted for a comprehensive rehab program.   Review of Systems  Constitutional:  Negative for chills and fever.  HENT:  Negative for hearing loss.   Eyes:  Negative for blurred vision and double vision.  Respiratory:  Negative for cough and shortness of breath.   Cardiovascular:  Negative for chest pain, palpitations and leg swelling.  Gastrointestinal:  Positive for constipation. Negative for heartburn, nausea  and vomiting.  Genitourinary:  Negative for dysuria, flank pain and hematuria.  Musculoskeletal:  Positive for joint pain and myalgias.       Noted 2 falls on day of admission due to left side weakness  Skin:  Negative for rash.  Neurological:  Positive for weakness and headaches.  Psychiatric/Behavioral:  Positive for depression.   All other systems reviewed and are negative. No past medical history on file.  The histories are not reviewed yet. Please review them in the "History" navigator section and refresh this [place]. No family history on file. Social History:  has no history on file for tobacco use, alcohol use, and drug use. Allergies:       Allergies  Allergen Reactions   Iodinated Diagnostic Agents Hives, Shortness Of Breath, Rash and Other (See Comments)      Required an Epi-pen   Iodine Hives, Shortness Of Breath and Rash   Shellfish-Derived Products Hives and Shortness Of Breath   Cefaclor Rash   Cheese Other (See Comments)      Headaches/migraines from Swiss and Parmesan only     Covid-19 (Mrna) Vaccine Nausea And Vomiting and Other (See Comments)      Severe Weakness, also   Levofloxacin Hives and Rash   Penicillins Hives and Rash   Wound Dressing Adhesive Other (See Comments)      Paper Tape ok          Medications Prior to Admission  Medication Sig Dispense Refill   albuterol (VENTOLIN HFA) 108 (90 Base) MCG/ACT inhaler Inhale 2 puffs into the lungs every 6 (six) hours as needed for wheezing or shortness of breath.  amitriptyline (ELAVIL) 25 MG tablet Take 25 mg by mouth at bedtime.       amLODipine (NORVASC) 10 MG tablet Take 10 mg by mouth daily.       aspirin EC 81 MG tablet Take 81 mg by mouth daily. Swallow whole.       busPIRone (BUSPAR) 5 MG tablet Take 5 mg by mouth in the morning and at bedtime.       butalbital-acetaminophen-caffeine (FIORICET) 50-325-40 MG tablet Take 1 tablet by mouth in the morning and at bedtime.       divalproex (DEPAKOTE  ER) 500 MG 24 hr tablet Take 500 mg by mouth at bedtime.       EPINEPHrine 0.3 mg/0.3 mL IJ SOAJ injection Inject 0.3 mg into the muscle once as needed for anaphylaxis.       famotidine (PEPCID) 40 MG tablet Take 40 mg by mouth at bedtime.       fluticasone-salmeterol (ADVAIR HFA) 115-21 MCG/ACT inhaler Inhale 2 puffs into the lungs 2 (two) times daily as needed (for cold-induced asthma symptoms).       Fremanezumab-vfrm (AJOVY) 225 MG/1.5ML SOSY Inject 225 mg into the skin every 30 (thirty) days.       gabapentin (NEURONTIN) 300 MG capsule Take 300 mg by mouth in the morning and at bedtime.       hydrochlorothiazide (HYDRODIURIL) 25 MG tablet Take 25 mg by mouth daily.       levothyroxine (SYNTHROID) 75 MCG tablet Take 75 mcg by mouth daily before breakfast.       losartan (COZAAR) 100 MG tablet Take 100 mg by mouth in the morning.       metoprolol tartrate (LOPRESSOR) 100 MG tablet Take 50-100 mg by mouth See admin instructions. Take 100 mg by mouth in the morning and 50 mg at bedtime       montelukast (SINGULAIR) 10 MG tablet Take 10 mg by mouth at bedtime.       omeprazole (PRILOSEC) 40 MG capsule Take 40 mg by mouth in the morning.       ondansetron (ZOFRAN-ODT) 4 MG disintegrating tablet Take 4 mg by mouth every 8 (eight) hours as needed for nausea or vomiting (dissolve orally).       promethazine (PHENERGAN) 25 MG suppository Place 25 mg rectally every 6 (six) hours as needed for nausea or vomiting.       sertraline (ZOLOFT) 100 MG tablet Take 50-100 mg by mouth See admin instructions. Take 100 mg by mouth in the morning 50 mg at bedtime       simvastatin (ZOCOR) 40 MG tablet Take 40 mg by mouth at bedtime.       triamcinolone cream (KENALOG) 0.1 % Apply 1 application topically 2 (two) times daily as needed (to affected areas).       TYLENOL 500 MG tablet Take 500-1,000 mg by mouth daily as needed for mild pain or headache.          Drug Regimen Review Drug regimen was reviewed and  remains appropriate with no significant issues identified   Home: Home Living Family/patient expects to be discharged to:: Private residence Living Arrangements: Spouse/significant other Available Help at Discharge: Family, Available 24 hours/day Type of Home: House Home Access: Stairs to enter [employer] of Steps: 3 Home Layout: Two level, Laundry or work area in basement, Able to live on main level with bedroom/bathroom Alternate Level Stairs-Number of Steps: flight Bathroom Shower/Tub: Health visitor: Handicapped height Bathroom Accessibility:  Yes Home Equipment: Conservation officer, nature (2 wheels), Rollator (4 wheels), [employer] - single point, Shower seat - built in  Lives With: Spouse   Functional History: Prior Function Prior Level of Function : Independent/Modified Independent Mobility Comments: Active. Walked her dog multiple times/day   Functional Status:  Mobility: Bed Mobility Overal bed mobility: Needs Assistance Bed Mobility: Supine to Sit Supine to sit: Min assist, HOB elevated Sit to supine: Min guard General bed mobility comments: +rail, increased time, assist to elevate trunk and scoot to EOB Transfers Overall transfer level: Needs assistance Equipment used: Rolling walker (2 wheels) Transfers: Sit to/from Stand Sit to Stand: Mod assist General transfer comment: assist to power up and stabilize balance. Assist to maintain L grip on RW. Ambulation/Gait Ambulation/Gait assistance: Mod assist Gait Distance (Feet): 5 Feet Assistive device: Rolling walker (2 wheels) Gait Pattern/deviations: Step-to pattern, Decreased stride length, Decreased weight shift to left General Gait Details: cues for sequencing, assist to maintain balance and L grip on RW, cues to look up as pt tends to watch her feet Gait velocity: decreased   ADL: ADL Overall ADL's : Needs assistance/impaired Eating/Feeding: Minimal assistance, Sitting Grooming: Minimal assistance,  Sitting Upper Body Bathing: Moderate assistance, Sitting Lower Body Bathing: Maximal assistance, Sitting/lateral leans, Sit to/from stand Upper Body Dressing : Moderate assistance, Sitting Lower Body Dressing: Maximal assistance, Sitting/lateral leans, Sit to/from stand Toilet Transfer: Maximal assistance, Stand-pivot Toileting- Clothing Manipulation and Hygiene: Maximal assistance, Sitting/lateral lean, Sit to/from stand Functional mobility during ADLs: Maximal assistance, Rolling walker (2 wheels) General ADL Comments: Pt requiring increased assist due to poor balance, L lateral lean, LUE and LLE weakness/incoordination,   Cognition: Cognition Overall Cognitive Status: Within Functional Limits for tasks assessed Arousal/Alertness: Awake/alert Orientation Level: Oriented X4 Year: 2022 Month: December Day of Week: Correct Attention: Sustained Sustained Attention: Impaired Sustained Attention Impairment: Verbal complex, Functional complex Memory: Appears intact Immediate Memory Recall: Sheila Oats, Bed Memory Recall Sock: Without Cue Memory Recall Blue: Without Cue Memory Recall Bed: Without Cue Awareness: Appears intact Problem Solving: Appears intact Safety/Judgment: Appears intact Cognition Arousal/Alertness: Awake/alert Behavior During Therapy: WFL for tasks assessed/performed Overall Cognitive Status: Within Functional Limits for tasks assessed General Comments: highly motivated   Physical Exam: Blood pressure (!) 151/82, pulse (!) 106, temperature 98.3 F (36.8 C), temperature source Oral, resp. rate 18, height 5\' 5"  (1.651 m), weight 90.8 kg, SpO2 95 %. Physical Exam Constitutional:      General: She is not in acute distress.    Appearance: She is obese. She is not ill-appearing.  HENT:     Head: Normocephalic and atraumatic.     Right Ear: External ear normal.     Left Ear: External ear normal.     Nose: Nose normal.     Mouth/Throat:     Mouth: Mucous membranes  are moist.     Pharynx: No oropharyngeal exudate.  Eyes:     General: No scleral icterus.    Extraocular Movements: Extraocular movements intact.     Pupils: Pupils are equal, round, and reactive to light.  Cardiovascular:     Rate and Rhythm: Regular rhythm. Tachycardia present.     Heart sounds: No murmur heard.   No gallop.  Pulmonary:     Effort: Pulmonary effort is normal. No respiratory distress.     Breath sounds: No wheezing.  Abdominal:     General: Bowel sounds are normal. There is no distension.     Palpations: Abdomen is soft.     Tenderness:  There is no abdominal tenderness.  Musculoskeletal:        General: No swelling or tenderness. Normal range of motion.     Cervical back: Normal range of motion and neck supple.  Skin:    General: Skin is warm.     Comments: Heels very dry  Neurological:     Comments: Alert and oriented x 3. Normal insight and awareness. Intact Memory. Normal language and speech. Cranial nerve exam unremarkable except for left central 7 and left peri-oral sensory loss. RUE 5/5. RLE 5/5. LUE 2+ to 3-/5 prox to 3/5 distally. LLE 4- prox to 4/5 distally. No focal sensory findings. DTR's brisk left side  Psychiatric:        Mood and Affect: Mood normal.        Behavior: Behavior normal.      Lab Results Last 48 Hours        Results for orders placed or performed during the hospital encounter of 02/07/21 (from the past 48 hour(s))  CBG monitoring, ED     Status: Abnormal    Collection Time: 02/08/21  7:58 AM  Result Value Ref Range    Glucose-Capillary 219 (H) 70 - 99 mg/dL      Comment: Glucose reference range applies only to samples taken after fasting for at least 8 hours.  CBG monitoring, ED     Status: Abnormal    Collection Time: 02/08/21 11:59 AM  Result Value Ref Range    Glucose-Capillary 167 (H) 70 - 99 mg/dL      Comment: Glucose reference range applies only to samples taken after fasting for at least 8 hours.  Glucose, capillary      Status: Abnormal    Collection Time: 02/08/21  2:36 PM  Result Value Ref Range    Glucose-Capillary 203 (H) 70 - 99 mg/dL      Comment: Glucose reference range applies only to samples taken after fasting for at least 8 hours.  Glucose, capillary     Status: Abnormal    Collection Time: 02/08/21  5:24 PM  Result Value Ref Range    Glucose-Capillary 311 (H) 70 - 99 mg/dL      Comment: Glucose reference range applies only to samples taken after fasting for at least 8 hours.  Glucose, capillary     Status: Abnormal    Collection Time: 02/08/21  9:11 PM  Result Value Ref Range    Glucose-Capillary 199 (H) 70 - 99 mg/dL      Comment: Glucose reference range applies only to samples taken after fasting for at least 8 hours.    Comment 1 Notify RN      Comment 2 Document in Chart    Glucose, capillary     Status: Abnormal    Collection Time: 02/09/21  6:21 AM  Result Value Ref Range    Glucose-Capillary 185 (H) 70 - 99 mg/dL      Comment: Glucose reference range applies only to samples taken after fasting for at least 8 hours.    Comment 1 Notify RN      Comment 2 Document in Chart    Glucose, capillary     Status: Abnormal    Collection Time: 02/09/21  8:49 AM  Result Value Ref Range    Glucose-Capillary 164 (H) 70 - 99 mg/dL      Comment: Glucose reference range applies only to samples taken after fasting for at least 8 hours.  Glucose, capillary     Status: Abnormal  Collection Time: 02/09/21 12:31 PM  Result Value Ref Range    Glucose-Capillary 208 (H) 70 - 99 mg/dL      Comment: Glucose reference range applies only to samples taken after fasting for at least 8 hours.    Comment 1 QC Due    Glucose, capillary     Status: Abnormal    Collection Time: 02/09/21  5:38 PM  Result Value Ref Range    Glucose-Capillary 225 (H) 70 - 99 mg/dL      Comment: Glucose reference range applies only to samples taken after fasting for at least 8 hours.  Glucose, capillary     Status: Abnormal     Collection Time: 02/09/21  9:45 PM  Result Value Ref Range    Glucose-Capillary 166 (H) 70 - 99 mg/dL      Comment: Glucose reference range applies only to samples taken after fasting for at least 8 hours.    Comment 1 Notify RN      Comment 2 Document in Chart         Imaging Results (Last 48 hours)  CT HEAD WO CONTRAST ( )   Result Date: 02/08/2021 CLINICAL DATA:  Neuro deficit, stroke suspected. EXAM: CT HEAD WITHOUT CONTRAST TECHNIQUE: Contiguous axial images were obtained from the base of the skull through the vertex without intravenous contrast. COMPARISON:  Plain brain CT and MR head, dated February 07, 2021 FINDINGS: Brain: There is mild cerebral atrophy with widening of the extra-axial spaces and ventricular dilatation. There are areas of decreased attenuation within the white matter tracts of the supratentorial brain, consistent with microvascular disease changes. A an ill-defined, approximately 8 mm diameter area of white matter low attenuation is seen along the para thalamic region on the right. This is slightly more prominent on the current exam when compared to the prior study and corresponds to findings seen on the prior MRI. There is no evidence of associated mass effect or midline shift. Vascular: No hyperdense vessel or unexpected calcification. Skull: Normal. Negative for fracture or focal lesion. Sinuses/Orbits: No acute finding. Other: None. IMPRESSION: Small, ill-defined subacute infarct along the para thalamic region on the right. This is slightly more prominent on the current exam when compared to the prior study and corresponds to findings seen on the prior MRI. Electronically Signed   By: Aram Candela M.D.   On: 02/08/2021 21:13    ECHOCARDIOGRAM COMPLETE   Result Date: 02/08/2021    ECHOCARDIOGRAM REPORT   Patient Name:   KIRSTAN FENTRESS Date of Exam: 02/08/2021 Medical Rec #:  [id number]      Height:       65.0 in Accession #:    [id number]     Weight:       200.2  lb Date of Birth:  Aug 14, 1950       BSA:          1.979 m Patient Age:    70 years       BP:           137/70 mmHg Patient Gender: F              HR:           95 bpm. Exam Location:  Inpatient Procedure: 2D Echo, Cardiac Doppler and Color Doppler Indications:    TIA  History:        Patient has no prior history of Echocardiogram examinations.  Sonographer:    Roosvelt Maser RDCS Referring Phys: [id number] [facility]  IMPRESSIONS  1. Left ventricular ejection fraction, by estimation, is 65 to 70%. The left ventricle has normal function. The left ventricle has no regional wall motion abnormalities. Left ventricular diastolic parameters are consistent with Grade I diastolic dysfunction (impaired relaxation).  2. Right ventricular systolic function is normal. The right ventricular size is normal.  3. The mitral valve is normal in structure. No evidence of mitral valve regurgitation. No evidence of mitral stenosis. Moderate to severe mitral annular calcification.  4. The aortic valve is tricuspid. There is mild thickening of the aortic valve. Aortic valve regurgitation is not visualized. Aortic valve sclerosis is present, with no evidence of aortic valve stenosis.  5. The inferior vena cava is normal in size with greater than 50% respiratory variability, suggesting right atrial pressure of 3 mmHg. FINDINGS  Left Ventricle: Left ventricular ejection fraction, by estimation, is 65 to 70%. The left ventricle has normal function. The left ventricle has no regional wall motion abnormalities. The left ventricular internal cavity size was normal in size. There is  no left ventricular hypertrophy. Left ventricular diastolic parameters are consistent with Grade I diastolic dysfunction (impaired relaxation). Normal left ventricular filling pressure. Right Ventricle: The right ventricular size is normal. No increase in right ventricular wall thickness. Right ventricular systolic function is normal. Left Atrium: Left atrial size was  normal in size. Right Atrium: Right atrial size was normal in size. Pericardium: There is no evidence of pericardial effusion. Mitral Valve: The mitral valve is normal in structure. Moderate to severe mitral annular calcification. No evidence of mitral valve regurgitation. No evidence of mitral valve stenosis. Tricuspid Valve: The tricuspid valve is normal in structure. Tricuspid valve regurgitation is not demonstrated. No evidence of tricuspid stenosis. Aortic Valve: The aortic valve is tricuspid. There is mild thickening of the aortic valve. Aortic valve regurgitation is not visualized. Aortic valve sclerosis is present, with no evidence of aortic valve stenosis. Aortic valve mean gradient measures 5.0  mmHg. Aortic valve peak gradient measures 8.4 mmHg. Aortic valve area, by VTI measures 2.61 cm. Pulmonic Valve: The pulmonic valve was normal in structure. Pulmonic valve regurgitation is not visualized. No evidence of pulmonic stenosis. Aorta: The aortic root is normal in size and structure. Venous: The inferior vena cava was not well visualized. The inferior vena cava is normal in size with greater than 50% respiratory variability, suggesting right atrial pressure of 3 mmHg. IAS/Shunts: No atrial level shunt detected by color flow Doppler.  LEFT VENTRICLE PLAX 2D LVIDd:         3.50 cm   Diastology LVIDs:         2.40 cm   LV e' medial:    6.20 cm/s LV PW:         1.00 cm   LV E/e' medial:  7.7 LV IVS:        1.00 cm   LV e' lateral:   7.51 cm/s LVOT diam:     2.00 cm   LV E/e' lateral: 6.3 LV SV:         63 LV SV Index:   32 LVOT Area:     3.14 cm  LEFT ATRIUM           Index        RIGHT ATRIUM           Index LA diam:      3.70 cm 1.87 cm/m   RA Area:     12.30 cm LA Vol (A2C): 34.1 ml  17.23 ml/m  RA Volume:   27.00 ml  13.64 ml/m LA Vol (A4C): 48.8 ml 24.66 ml/m  AORTIC VALVE AV Area (Vmax):    2.60 cm AV Area (Vmean):   2.64 cm AV Area (VTI):     2.61 cm AV Vmax:           145.00 cm/s AV Vmean:           97.700 cm/s AV VTI:            0.242 m AV Peak Grad:      8.4 mmHg AV Mean Grad:      5.0 mmHg LVOT Vmax:         120.00 cm/s LVOT Vmean:        82.000 cm/s LVOT VTI:          0.201 m LVOT/AV VTI ratio: 0.83  AORTA Ao Root diam: 2.90 cm Ao Asc diam:  2.80 cm MITRAL VALVE MV Area (PHT): 4.17 cm    SHUNTS MV Decel Time: 182 msec    Systemic VTI:  0.20 m MV E velocity: 47.67 cm/s  Systemic Diam: 2.00 cm MV A velocity: 99.40 cm/s MV E/A ratio:  0.48 Mihai Croitoru MD Electronically signed by Sanda Klein MD Signature Date/Time: 02/08/2021/2:53:21 PM    Final          BP (!) 151/81 (BP Location: Right Arm)    Pulse 98    Temp 98.6 F (37 C) (Oral)    Resp 20    Ht 5\' 5"  (1.651 m)    Wt 90.8 kg    SpO2 96%    BMI 33.31 kg/m     Medical Problem List and Plan: 1.  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             -patient may shower             -ELOS/Goals: 7-10 days, mod I to supervision goals 2.  Antithrombotics: -DVT/anticoagulation:  Pharmaceutical: Lovenox             -antiplatelet therapy: Aspirin 81 mg daily and Plavix 75 mg daily x3 weeks then Plavix alone 3. Pain Management: Elavil 25 mg nightly, Neurontin 300 mg twice daily, Fioricet as needed. 4. Mood: BuSpar 5 mg twice daily, Zoloft 100 mg daily and 50 mg nightly             -moods seems up beat. Has supportive family             -antipsychotic agents: N/A 5. Neuropsych: This patient is capable of making decisions on her own behalf. 6. Skin/Wound Care: Routine skin checks 7. Fluids/Electrolytes/Nutrition: Routine in and outs with follow-up chemistries 8.  Permissive hypertension.  Currently on Norvasc 5 mg daily.  Patient also on HCTZ 25 mg daily, Cozaar 100 mg every morning, Lopressor 100 mg every morning and 50 mg nightly prior to admission.   -reasonable control at present -Resume as needed monitor with  increased mobility 9.  History of migraine headaches.  Depakote 500 mg nightly,Ajovy 225 mg subcutaneous every 30 days 10.  OSA.  Continue inhalers as directed 11.  Hypothyroidism.  Synthroid 12.  Diabetes mellitus.  Hemoglobin A1c 8.3.  Jardiance 25 mg daily.  Check blood sugars before meals and at bedtime             -reasonable control at present 13.  Hyperlipidemia.  Lipitor 14.  Obesity.  BMI 33.31.  Dietary follow-up     Cathlyn Parsons, PA-C 02/10/2021   I have personally performed a face to face diagnostic evaluation of this patient and formulated the key components of the plan.  Additionally, I have personally reviewed laboratory data, imaging studies, as well as relevant notes and concur with the physician assistant's documentation above.  The patient's status has not changed from the original H&P.  Any changes in documentation from the acute care chart have been noted above.  Meredith Staggers, MD, [facility]

## 2021-02-10 NOTE — Progress Notes (Signed)
Meredith Staggers, MD  Physician Physical Medicine and Rehabilitation PMR Pre-admission     Signed Date of Service:  02/09/2021  1:59 PM  Related encounter: ED to Hosp-Admission (Current) from 02/07/2021 in [facility]   Signed      Show:Clear all _0 Written_1 Templated_2 Copied  Added by: _3 Cristina Gong, RN_4 Meredith Staggers, MD  _5 Hover for details                                                                                                                                                                                                                                                                                                                                                                                                                                                                PMR Admission Coordinator Pre-Admission Assessment   Patient: Lindsay Rice is an 70 y.o., female MRN: [id number] DOB: [birth year] Height: _6  (165.1 cm) Weight: 90.8 kg   Insurance Information HMO:     PPO:      PCP:      IPA:      80/20:      OTHER:  PRIMARY: Medicare a and b      Policy#: [id number]      Subscriber: pt Benefits:  Phone #: passport one  source online     Name: 02/09/21 Eff. Date: 02/20/2016     Deduct: $1556      Out of Pocket Max: none      Life Max: none CIR: 100%      SNF: 20 days Outpatient: 80%     Co-Pay: 20% Home Health: 100%      Co-Pay: none DME: 80%     Co-Pay: 20% Providers: pt choice  SECONDARY: [place] [employer] [place]      Policy#: [id number]   THIRD; Tricare for Life  policy # [id number]   Financial Counselor:       Phone#:    The Data Collection Information Summary for patients in Inpatient Rehabilitation Facilities with attached Privacy Act [place] Records was provided  and verbally reviewed with: Patient   Emergency Contact Information Contact Information       Name Relation Home Work Mobile    Ihde,JAMES Spouse     [phone]         Current Medical History  Patient Admitting Diagnosis: CVA   History of Present Illness:  70 year old right-handed female with history of migraine headaches maintained on ajovy subcutaneous every 30 days as well as Depakote, diabetes mellitus, obesity with BMI 33.31, hypertension, hyperlipidemia, mitral valve prolapse, OSA.   Presented 02/07/2021 with acute onset of left-sided weakness while getting out of her car resulting in a fall.  She was able to get into her home and went to the bathroom and fell again without loss of consciousness.  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  MRA of the head unremarkable no large vessel occlusion.  MRA of the neck negative without dissection or occlusion.  Patient did receive TNKase.  Admission chemistries unremarkable except glucose 373, creatinine 1.01, WBC 10,600, hemoglobin A1c 8.3.  Echocardiogram with ejection fraction of 65 to 70% no wall motion abnormalities grade 1 diastolic dysfunction.  Neurology follow-up maintained on aspirin 81 mg daily and Plavix 75 mg daily x3 weeks followed by Plavix alone.  Subcutaneous Lovenox for DVT prophylaxis.  Tolerating a regular consistency diet.    Complete NIHSS TOTAL: 4   Patient's medical record from [facility] has been reviewed by the rehabilitation admission coordinator and physician.   Past Medical History  No past medical history on file.   Has the patient had major surgery during 100 days prior to admission? No   Family History   family history is not on file.   Current Medications   Current Facility-Administered Medications:    acetaminophen (TYLENOL) tablet 650 mg, 650 mg, Oral,  Q4H PRN **OR** acetaminophen (TYLENOL) 160 MG/5ML solution 650 mg, 650 mg, Per Tube, Q4H PRN **OR** acetaminophen (TYLENOL) suppository 650 mg, 650 mg, Rectal, Q4H PRN, Donnetta Simpers, MD   albuterol (PROVENTIL) (2.5 MG/3ML) 0.083% nebulizer solution 3 mL, 3 mL, Inhalation, Q6H PRN, Donnetta Simpers, MD   amitriptyline (ELAVIL) tablet 25 mg, 25 mg, Oral, QHS, Khaliqdina, Salman, MD, 25 mg at 02/09/21 2257   amLODipine (NORVASC) tablet 5 mg, 5 mg, Oral, Daily, Donnetta Simpers, MD, 5 mg at 02/09/21 1055   aspirin chewable tablet 81 mg, 81 mg, Oral, Daily, Donnetta Simpers, MD, 81 mg at 02/09/21 1056   atorvastatin (LIPITOR) tablet 80 mg, 80 mg, Oral, QHS, Sethi, Pramod S, MD, 80 mg at 02/09/21 2257   busPIRone (BUSPAR) tablet 5 mg, 5 mg, Oral, BID, Lorrin Goodell,  Alferd Patee, MD, 5 mg at 02/09/21 2257   butalbital-acetaminophen-caffeine (FIORICET) 50-325-40 MG per tablet 1 tablet, 1 tablet, Oral, Q6H PRN, Donnetta Simpers, MD, 1 tablet at 02/09/21 0343   clopidogrel (PLAVIX) tablet 75 mg, 75 mg, Oral, Daily, Donnetta Simpers, MD, 75 mg at 02/09/21 1056   divalproex (DEPAKOTE ER) 24 hr tablet 500 mg, 500 mg, Oral, QHS, Donnetta Simpers, MD, 500 mg at 02/09/21 2257   empagliflozin (JARDIANCE) tablet 25 mg, 25 mg, Oral, Daily, Leonie Man, Pramod S, MD, 25 mg at 02/09/21 1500   enoxaparin (LOVENOX) injection 40 mg, 40 mg, Subcutaneous, Q24H, Donnetta Simpers, MD, 40 mg at 02/09/21 2257   famotidine (PEPCID) tablet 40 mg, 40 mg, Oral, QHS, Donnetta Simpers, MD, 40 mg at 02/09/21 2257   gabapentin (NEURONTIN) capsule 300 mg, 300 mg, Oral, BID, Donnetta Simpers, MD, 300 mg at 02/09/21 2257   insulin aspart (novoLOG) injection 0-9 Units, 0-9 Units, Subcutaneous, TID WC, Donnetta Simpers, MD, 2 Units at 02/10/21 3875   levothyroxine (SYNTHROID) tablet 75 mcg, 75 mcg, Oral, QAC breakfast, Donnetta Simpers, MD, 75 mcg at 02/10/21 0720   LORazepam (ATIVAN) injection 1 mg, 1 mg, Intravenous, Once,  Donnetta Simpers, MD   mometasone-formoterol (DULERA) 200-5 MCG/ACT inhaler 2 puff, 2 puff, Inhalation, BID, Donnetta Simpers, MD, 2 puff at 02/10/21 0816   pantoprazole (PROTONIX) EC tablet 40 mg, 40 mg, Oral, Daily, Rozann Lesches, RPH   senna-docusate (Senokot-S) tablet 1 tablet, 1 tablet, Oral, QHS PRN, Donnetta Simpers, MD   sertraline (ZOLOFT) tablet 100 mg, 100 mg, Oral, Daily, 100 mg at 02/09/21 1055 **AND** sertraline (ZOLOFT) tablet 50 mg, 50 mg, Oral, QHS, Donnetta Simpers, MD, 50 mg at 02/09/21 2257   Patients Current Diet:  Diet Order                  Diet Carb Modified Fluid consistency: Thin; Room service appropriate? Yes with Assist  Diet effective now                       Precautions / Restrictions Precautions Precautions: Fall, Other (comment) Precaution Comments: L weakness Restrictions Weight Bearing Restrictions: No    Has the patient had 2 or more falls or a fall with injury in the past year? yes   Prior Activity Level Community (5-7x/wk): retired and independent   Prior Functional Level Self Care: Did the patient need help bathing, dressing, using the toilet or eating? Independent   Indoor Mobility: Did the patient need assistance with walking from room to room (with or without device)? Independent   Stairs: Did the patient need assistance with internal or external stairs (with or without device)? Independent   Functional Cognition: Did the patient need help planning regular tasks such as shopping or remembering to take medications? Independent   Patient Information Are you of Hispanic, Latino/a,or Spanish origin?: A. No, not of Hispanic, Latino/a, or Spanish origin What is your race?: A. White Do you need or want an interpreter to communicate with a doctor or health care staff?: 0. No   Patient's Response To:  Health Literacy and Transportation Is the patient able to respond to health literacy and transportation needs?: Yes Health  Literacy - How often do you need to have someone help you when you read instructions, pamphlets, or other written material from your doctor or pharmacy?: Never In the past 12 months, has lack of transportation kept you from medical appointments or from getting medications?: No In the past 12 months,  has lack of transportation kept you from meetings, work, or from getting things needed for daily living?: No   Home Assistive Devices / Equipment Home Equipment: Conservation officer, nature (2 wheels), Rollator (4 wheels), [employer] - single point, Civil engineer, contracting - built in   Prior Device Use: Indicate devices/aids used by the patient prior to current illness, exacerbation or injury? None of the above   Current Functional Level Cognition   Arousal/Alertness: Awake/alert Overall Cognitive Status: Within Functional Limits for tasks assessed Orientation Level: Oriented X4 General Comments: highly motivated Attention: Sustained Sustained Attention: Impaired Sustained Attention Impairment: Verbal complex, Functional complex Memory: Appears intact Awareness: Appears intact Problem Solving: Appears intact Safety/Judgment: Appears intact    Extremity Assessment (includes Sensation/Coordination)   Upper Extremity Assessment: Defer to OT evaluation LUE Deficits / Details: Weak 3/5, unable to maintain 90 degrees for more than 5 seconds, poor coordination. Diminshed sensation LUE Sensation: decreased light touch LUE Coordination: decreased fine motor, decreased gross motor  Lower Extremity Assessment: LLE deficits/detail LLE Deficits / Details: grossly 3/5, numbness distally LLE Sensation: decreased proprioception     ADLs   Overall ADL's : Needs assistance/impaired Eating/Feeding: Minimal assistance, Sitting Grooming: Minimal assistance, Sitting Upper Body Bathing: Moderate assistance, Sitting Lower Body Bathing: Maximal assistance, Sitting/lateral leans, Sit to/from stand Upper Body Dressing : Moderate  assistance, Sitting Lower Body Dressing: Maximal assistance, Sitting/lateral leans, Sit to/from stand Toilet Transfer: Maximal assistance, Stand-pivot Toileting- Clothing Manipulation and Hygiene: Maximal assistance, Sitting/lateral lean, Sit to/from stand Functional mobility during ADLs: Maximal assistance, Rolling walker (2 wheels) General ADL Comments: Pt requiring increased assist due to poor balance, L lateral lean, LUE and LLE weakness/incoordination,     Mobility   Overal bed mobility: Needs Assistance Bed Mobility: Supine to Sit Supine to sit: Min assist, HOB elevated Sit to supine: Min guard General bed mobility comments: +rail, increased time, assist to elevate trunk and scoot to EOB     Transfers   Overall transfer level: Needs assistance Equipment used: Rolling walker (2 wheels) Transfers: Sit to/from Stand Sit to Stand: Mod assist General transfer comment: assist to power up and stabilize balance. Assist to maintain L grip on RW.     Ambulation / Gait / Stairs / Wheelchair Mobility   Ambulation/Gait Ambulation/Gait assistance: Mod assist Gait Distance (Feet): 5 Feet Assistive device: Rolling walker (2 wheels) Gait Pattern/deviations: Step-to pattern, Decreased stride length, Decreased weight shift to left General Gait Details: cues for sequencing, assist to maintain balance and L grip on RW, cues to look up as pt tends to watch her feet Gait velocity: decreased     Posture / Balance Dynamic Sitting Balance Sitting balance - Comments: Posterior lean with initial sit. Able to stabilize and maintain min guard assist. Balance Overall balance assessment: Needs assistance Sitting-balance support: Feet supported, No upper extremity supported, Single extremity supported Sitting balance-Leahy Scale: Fair Sitting balance - Comments: Posterior lean with initial sit. Able to stabilize and maintain min guard assist. Postural control: Posterior lean Standing balance support:  Single extremity supported, Bilateral upper extremity supported, Reliant on assistive device for balance Standing balance-Leahy Scale: Poor Standing balance comment: increased time to stabilize static balance with initial stance     Special needs/care consideration Hgb A1c 8.3    Previous Home Environment  Living Arrangements: Spouse/significant other  Lives With: Spouse Available Help at Discharge: Family, Available 24 hours/day Type of Home: House Home Layout: Two level, Laundry or work area in basement, Able to live on main level with bedroom/bathroom Alternate  Level Stairs-Number of Steps: flight Home Access: Stairs to enter Technical brewer of Steps: 3 Bathroom Shower/Tub: Multimedia programmer: Handicapped height Bathroom Accessibility: Yes How Accessible: Accessible via walker Hitchcock: No   Discharge Living Setting Plans for Discharge Living Setting: Patient's home, Lives with (comment) (spouse) Type of Home at Discharge: House Discharge Home Layout: Two level, Laundry or work area in basement, Able to live on main level with bedroom/bathroom Alternate Level Stairs-Number of Steps: flight Discharge Home Access: Stairs to enter Entrance Stairs-Rails: None Entrance Stairs-Number of Steps: 3 Discharge Bathroom Shower/Tub: Pharmacologist Toilet: Handicapped height Discharge Bathroom Accessibility: Yes How Accessible: Accessible via walker Does the patient have any problems obtaining your medications?: No   Social/Family/Support Systems Patient Roles: Spouse Contact Information: spouse, Jeneen Rinks Anticipated Caregiver: children and grandchildren Anticipated Caregiver's Contact Information: see contacts Ability/Limitations of Caregiver: spouse 47 years old Caregiver Availability: 24/7 Discharge Plan Discussed with Primary Caregiver: Yes Is Caregiver In Agreement with Plan?: Yes Does Caregiver/Family have Issues with  Lodging/Transportation while Pt is in Rehab?: No   Goals Patient/Family Goal for Rehab: Mod I to supervision with PT and OT, supervision with SLP Expected length of stay: ELOS 7 to 10 days Pt/Family Agrees to Admission and willing to participate: Yes Program Orientation Provided & Reviewed with Pt/Caregiver Including Roles  & Responsibilities: Yes   Decrease burden of Care through IP rehab admission: n/a   Possible need for SNF placement upon discharge: not anticipated   Patient Condition: I have reviewed medical records from [facility], spoken with CM, and patient. I met with patient at the bedside for inpatient rehabilitation assessment.  Patient will benefit from ongoing PT, OT, and SLP, can actively participate in 3 hours of therapy a day 5 days of the week, and can make measurable gains during the admission.  Patient will also benefit from the coordinated team approach during an Inpatient Acute Rehabilitation admission.  The patient will receive intensive therapy as well as Rehabilitation physician, nursing, social worker, and care management interventions.  Due to bladder management, bowel management, safety, skin/wound care, disease management, medication administration, pain management, and patient education the patient requires 24 hour a day rehabilitation nursing.  The patient is currently mod assist overall with mobility and basic ADLs.  Discharge setting and therapy post discharge at home with home health is anticipated.  Patient has agreed to participate in the Acute Inpatient Rehabilitation Program and will admit today.   Preadmission Screen Completed By:  Cleatrice Burke, 02/10/2021 10:09 AM ______________________________________________________________________   Discussed status with Dr. Naaman Plummer on 02/10/2021 at 1009 and received approval for admission today.   Admission Coordinator:  Cleatrice Burke, RN, time  1009 Date 02/10/2021     Assessment/Plan: Diagnosis: Right PLIC infarct Does the need for close, 24 hr/day Medical supervision in concert with the patient's rehab needs make it unreasonable for this patient to be served in a less intensive setting? Yes Co-Morbidities requiring supervision/potential complications: migraines, dm, obesity Due to bladder management, bowel management, safety, skin/wound care, disease management, medication administration, pain management, and patient education, does the patient require 24 hr/day rehab nursing? Yes Does the patient require coordinated care of a physician, rehab nurse, PT, OT, and SLP to address physical and functional deficits in the context of the above medical diagnosis(es)? Yes Addressing deficits in the following areas: balance, endurance, locomotion, strength, transferring, bowel/bladder control, bathing, dressing, feeding, grooming, toileting, cognition, speech, and psychosocial support Can the patient actively participate  in an intensive therapy program of at least 3 hrs of therapy 5 days a week? Yes The potential for patient to make measurable gains while on inpatient rehab is excellent Anticipated functional outcomes upon discharge from inpatient rehab: modified independent and supervision PT, modified independent and supervision OT, supervision SLP Estimated rehab length of stay to reach the above functional goals is: 7-10 days Anticipated discharge destination: Home 10. Overall Rehab/Functional Prognosis: excellent     MD Signature: Meredith Staggers, MD, [place] Director Rehabilitation Services 02/10/2021          Revision History                          Note Details  Author Meredith Staggers, MD File Time 02/10/2021 10:15 AM  Author Type Physician Status Signed  Last Editor Meredith Staggers, MD Service Physical Medicine and Rehabilitation

## 2021-02-10 NOTE — Progress Notes (Signed)
INPATIENT REHABILITATION ADMISSION NOTE  Patient arrived to 5C18 at 58  Arrival Method: Bed from [facility]West     Mental Orientation: A/O x4   Assessment: Complete    Skin: Documented    IV'S: None   Pain: None   Tubes and Drains: None   Safety Measures: Bed alarm on, Call bell within reach   Vital Signs: Taken    Height and Weight: Taken   Rehab Orientation: Complete    Family: Notified     Notes:

## 2021-02-10 NOTE — H&P (Signed)
Physical Medicine and Rehabilitation Admission H&P     HPI: Lindsay Rice is a 70 year old right-handed female with history of migraine headaches maintained on ajovy subcutaneous every 30 days as well as Depakote, diabetes mellitus, obesity with BMI 33.31, hypertension, hyperlipidemia, mitral valve prolapse, OSA.  Per chart review patient lives with spouse.  Two-level home bed and bath main level 3 steps to entry.  Independent prior to admission.  Presented 02/07/2021 with acute onset of left-sided weakness while getting out of her car resulting in a fall.  She was able to get into her home and went to the bathroom and fell again without loss of consciousness.  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  MRA of the head unremarkable no large vessel occlusion.  MRA of the neck negative without dissection or occlusion.  Patient did receive TNKase.  Admission chemistries unremarkable except glucose 373, creatinine 1.01, WBC 10,600, hemoglobin A1c 8.3.  Echocardiogram with ejection fraction of 65 to 70% no wall motion abnormalities grade 1 diastolic dysfunction.  Neurology follow-up maintained on aspirin 81 mg daily and Plavix 75 mg daily x3 weeks followed by Plavix alone.  Subcutaneous Lovenox for DVT prophylaxis.  Tolerating a regular consistency diet.  Therapy evaluations completed due to patient's left-sided weakness decreased functional mobility was admitted for a comprehensive rehab program.  Review of Systems  Constitutional:  Negative for chills and fever.  HENT:  Negative for hearing loss.   Eyes:  Negative for blurred vision and double vision.  Respiratory:  Negative for cough and shortness of breath.   Cardiovascular:  Negative for chest pain, palpitations and leg swelling.  Gastrointestinal:  Positive for constipation. Negative for heartburn, nausea and  vomiting.  Genitourinary:  Negative for dysuria, flank pain and hematuria.  Musculoskeletal:  Positive for joint pain and myalgias.       Noted 2 falls on day of admission due to left side weakness  Skin:  Negative for rash.  Neurological:  Positive for weakness and headaches.  Psychiatric/Behavioral:  Positive for depression.   All other systems reviewed and are negative. No past medical history on file.  The histories are not reviewed yet. Please review them in the "History" navigator section and refresh this [place]. No family history on file. Social History:  has no history on file for tobacco use, alcohol use, and drug use. Allergies:  Allergies  Allergen Reactions   Iodinated Diagnostic Agents Hives, Shortness Of Breath, Rash and Other (See Comments)    Required an Epi-pen   Iodine Hives, Shortness Of Breath and Rash   Shellfish-Derived Products Hives and Shortness Of Breath   Cefaclor Rash   Cheese Other (See Comments)    Headaches/migraines from Swiss and Parmesan only    Covid-19 (Mrna) Vaccine Nausea And Vomiting and Other (See Comments)    Severe Weakness, also   Levofloxacin Hives and Rash   Penicillins Hives and Rash   Wound Dressing Adhesive Other (See Comments)    Paper Tape ok   Medications Prior to Admission  Medication Sig Dispense Refill   albuterol (VENTOLIN HFA) 108 (90 Base) MCG/ACT inhaler Inhale 2 puffs into the lungs every 6 (six) hours as needed for wheezing or shortness of breath.     amitriptyline (ELAVIL) 25 MG tablet Take 25 mg by mouth at bedtime.     amLODipine (NORVASC) 10 MG tablet Take 10  mg by mouth daily.     aspirin EC 81 MG tablet Take 81 mg by mouth daily. Swallow whole.     busPIRone (BUSPAR) 5 MG tablet Take 5 mg by mouth in the morning and at bedtime.     butalbital-acetaminophen-caffeine (FIORICET) 50-325-40 MG tablet Take 1 tablet by mouth in the morning and at bedtime.     divalproex (DEPAKOTE ER) 500 MG 24 hr tablet Take 500 mg by  mouth at bedtime.     EPINEPHrine 0.3 mg/0.3 mL IJ SOAJ injection Inject 0.3 mg into the muscle once as needed for anaphylaxis.     famotidine (PEPCID) 40 MG tablet Take 40 mg by mouth at bedtime.     fluticasone-salmeterol (ADVAIR HFA) 115-21 MCG/ACT inhaler Inhale 2 puffs into the lungs 2 (two) times daily as needed (for cold-induced asthma symptoms).     Fremanezumab-vfrm (AJOVY) 225 MG/1.5ML SOSY Inject 225 mg into the skin every 30 (thirty) days.     gabapentin (NEURONTIN) 300 MG capsule Take 300 mg by mouth in the morning and at bedtime.     hydrochlorothiazide (HYDRODIURIL) 25 MG tablet Take 25 mg by mouth daily.     levothyroxine (SYNTHROID) 75 MCG tablet Take 75 mcg by mouth daily before breakfast.     losartan (COZAAR) 100 MG tablet Take 100 mg by mouth in the morning.     metoprolol tartrate (LOPRESSOR) 100 MG tablet Take 50-100 mg by mouth See admin instructions. Take 100 mg by mouth in the morning and 50 mg at bedtime     montelukast (SINGULAIR) 10 MG tablet Take 10 mg by mouth at bedtime.     omeprazole (PRILOSEC) 40 MG capsule Take 40 mg by mouth in the morning.     ondansetron (ZOFRAN-ODT) 4 MG disintegrating tablet Take 4 mg by mouth every 8 (eight) hours as needed for nausea or vomiting (dissolve orally).     promethazine (PHENERGAN) 25 MG suppository Place 25 mg rectally every 6 (six) hours as needed for nausea or vomiting.     sertraline (ZOLOFT) 100 MG tablet Take 50-100 mg by mouth See admin instructions. Take 100 mg by mouth in the morning 50 mg at bedtime     simvastatin (ZOCOR) 40 MG tablet Take 40 mg by mouth at bedtime.     triamcinolone cream (KENALOG) 0.1 % Apply 1 application topically 2 (two) times daily as needed (to affected areas).     TYLENOL 500 MG tablet Take 500-1,000 mg by mouth daily as needed for mild pain or headache.      Drug Regimen Review Drug regimen was reviewed and remains appropriate with no significant issues identified  Home: Home  Living Family/patient expects to be discharged to:: Private residence Living Arrangements: Spouse/significant other Available Help at Discharge: Family, Available 24 hours/day Type of Home: House Home Access: Stairs to enter [employer] of Steps: 3 Home Layout: Two level, Laundry or work area in basement, Able to live on main level with bedroom/bathroom Alternate Level Stairs-Number of Steps: flight Bathroom Shower/Tub: Multimedia programmer: Handicapped height Bathroom Accessibility: Yes Home Equipment: Conservation officer, nature (2 wheels), Rollator (4 wheels), [employer] - single point, Civil engineer, contracting - built in  Lives With: Spouse   Functional History: Prior Function Prior Level of Function : Independent/Modified Independent Mobility Comments: Active. Walked her dog multiple times/day  Functional Status:  Mobility: Bed Mobility Overal bed mobility: Needs Assistance Bed Mobility: Supine to Sit Supine to sit: Min assist, HOB elevated Sit to supine: Min  guard General bed mobility comments: +rail, increased time, assist to elevate trunk and scoot to EOB Transfers Overall transfer level: Needs assistance Equipment used: Rolling walker (2 wheels) Transfers: Sit to/from Stand Sit to Stand: Mod assist General transfer comment: assist to power up and stabilize balance. Assist to maintain L grip on RW. Ambulation/Gait Ambulation/Gait assistance: Mod assist Gait Distance (Feet): 5 Feet Assistive device: Rolling walker (2 wheels) Gait Pattern/deviations: Step-to pattern, Decreased stride length, Decreased weight shift to left General Gait Details: cues for sequencing, assist to maintain balance and L grip on RW, cues to look up as pt tends to watch her feet Gait velocity: decreased    ADL: ADL Overall ADL's : Needs assistance/impaired Eating/Feeding: Minimal assistance, Sitting Grooming: Minimal assistance, Sitting Upper Body Bathing: Moderate assistance, Sitting Lower Body  Bathing: Maximal assistance, Sitting/lateral leans, Sit to/from stand Upper Body Dressing : Moderate assistance, Sitting Lower Body Dressing: Maximal assistance, Sitting/lateral leans, Sit to/from stand Toilet Transfer: Maximal assistance, Stand-pivot Toileting- Clothing Manipulation and Hygiene: Maximal assistance, Sitting/lateral lean, Sit to/from stand Functional mobility during ADLs: Maximal assistance, Rolling walker (2 wheels) General ADL Comments: Pt requiring increased assist due to poor balance, L lateral lean, LUE and LLE weakness/incoordination,  Cognition: Cognition Overall Cognitive Status: Within Functional Limits for tasks assessed Arousal/Alertness: Awake/alert Orientation Level: Oriented X4 Year: 2022 Month: December Day of Week: Correct Attention: Sustained Sustained Attention: Impaired Sustained Attention Impairment: Verbal complex, Functional complex Memory: Appears intact Immediate Memory Recall: Angela Adam, Bed Memory Recall Sock: Without Cue Memory Recall Blue: Without Cue Memory Recall Bed: Without Cue Awareness: Appears intact Problem Solving: Appears intact Safety/Judgment: Appears intact Cognition Arousal/Alertness: Awake/alert Behavior During Therapy: WFL for tasks assessed/performed Overall Cognitive Status: Within Functional Limits for tasks assessed General Comments: highly motivated  Physical Exam: Blood pressure (!) 151/82, pulse (!) 106, temperature 98.3 F (36.8 C), temperature source Oral, resp. rate 18, height 5\' 5"  (1.651 m), weight 90.8 kg, SpO2 95 %. Physical Exam Constitutional:      General: She is not in acute distress.    Appearance: She is obese. She is not ill-appearing.  HENT:     Head: Normocephalic and atraumatic.     Right Ear: External ear normal.     Left Ear: External ear normal.     Nose: Nose normal.     Mouth/Throat:     Mouth: Mucous membranes are moist.     Pharynx: No oropharyngeal exudate.  Eyes:     General:  No scleral icterus.    Extraocular Movements: Extraocular movements intact.     Pupils: Pupils are equal, round, and reactive to light.  Cardiovascular:     Rate and Rhythm: Regular rhythm. Tachycardia present.     Heart sounds: No murmur heard.   No gallop.  Pulmonary:     Effort: Pulmonary effort is normal. No respiratory distress.     Breath sounds: No wheezing.  Abdominal:     General: Bowel sounds are normal. There is no distension.     Palpations: Abdomen is soft.     Tenderness: There is no abdominal tenderness.  Musculoskeletal:        General: No swelling or tenderness. Normal range of motion.     Cervical back: Normal range of motion and neck supple.  Skin:    General: Skin is warm.     Comments: Heels very dry  Neurological:     Comments: Alert and oriented x 3. Normal insight and awareness. Intact Memory. Normal language and  speech. Cranial nerve exam unremarkable except for left central 7 and left peri-oral sensory loss. RUE 5/5. RLE 5/5. LUE 2+ to 3-/5 prox to 3/5 distally. LLE 4- prox to 4/5 distally. No focal sensory findings. DTR's brisk left side  Psychiatric:        Mood and Affect: Mood normal.        Behavior: Behavior normal.    Results for orders placed or performed during the hospital encounter of 02/07/21 (from the past 48 hour(s))  CBG monitoring, ED     Status: Abnormal   Collection Time: 02/08/21  7:58 AM  Result Value Ref Range   Glucose-Capillary 219 (H) 70 - 99 mg/dL    Comment: Glucose reference range applies only to samples taken after fasting for at least 8 hours.  CBG monitoring, ED     Status: Abnormal   Collection Time: 02/08/21 11:59 AM  Result Value Ref Range   Glucose-Capillary 167 (H) 70 - 99 mg/dL    Comment: Glucose reference range applies only to samples taken after fasting for at least 8 hours.  Glucose, capillary     Status: Abnormal   Collection Time: 02/08/21  2:36 PM  Result Value Ref Range   Glucose-Capillary 203 (H) 70 - 99  mg/dL    Comment: Glucose reference range applies only to samples taken after fasting for at least 8 hours.  Glucose, capillary     Status: Abnormal   Collection Time: 02/08/21  5:24 PM  Result Value Ref Range   Glucose-Capillary 311 (H) 70 - 99 mg/dL    Comment: Glucose reference range applies only to samples taken after fasting for at least 8 hours.  Glucose, capillary     Status: Abnormal   Collection Time: 02/08/21  9:11 PM  Result Value Ref Range   Glucose-Capillary 199 (H) 70 - 99 mg/dL    Comment: Glucose reference range applies only to samples taken after fasting for at least 8 hours.   Comment 1 Notify RN    Comment 2 Document in Chart   Glucose, capillary     Status: Abnormal   Collection Time: 02/09/21  6:21 AM  Result Value Ref Range   Glucose-Capillary 185 (H) 70 - 99 mg/dL    Comment: Glucose reference range applies only to samples taken after fasting for at least 8 hours.   Comment 1 Notify RN    Comment 2 Document in Chart   Glucose, capillary     Status: Abnormal   Collection Time: 02/09/21  8:49 AM  Result Value Ref Range   Glucose-Capillary 164 (H) 70 - 99 mg/dL    Comment: Glucose reference range applies only to samples taken after fasting for at least 8 hours.  Glucose, capillary     Status: Abnormal   Collection Time: 02/09/21 12:31 PM  Result Value Ref Range   Glucose-Capillary 208 (H) 70 - 99 mg/dL    Comment: Glucose reference range applies only to samples taken after fasting for at least 8 hours.   Comment 1 QC Due   Glucose, capillary     Status: Abnormal   Collection Time: 02/09/21  5:38 PM  Result Value Ref Range   Glucose-Capillary 225 (H) 70 - 99 mg/dL    Comment: Glucose reference range applies only to samples taken after fasting for at least 8 hours.  Glucose, capillary     Status: Abnormal   Collection Time: 02/09/21  9:45 PM  Result Value Ref Range   Glucose-Capillary 166 (H)  70 - 99 mg/dL    Comment: Glucose reference range applies only to  samples taken after fasting for at least 8 hours.   Comment 1 Notify RN    Comment 2 Document in Chart    CT HEAD WO CONTRAST (5MM)  Result Date: 02/08/2021 CLINICAL DATA:  Neuro deficit, stroke suspected. EXAM: CT HEAD WITHOUT CONTRAST TECHNIQUE: Contiguous axial images were obtained from the base of the skull through the vertex without intravenous contrast. COMPARISON:  Plain brain CT and MR head, dated February 07, 2021 FINDINGS: Brain: There is mild cerebral atrophy with widening of the extra-axial spaces and ventricular dilatation. There are areas of decreased attenuation within the white matter tracts of the supratentorial brain, consistent with microvascular disease changes. A an ill-defined, approximately 8 mm diameter area of white matter low attenuation is seen along the para thalamic region on the right. This is slightly more prominent on the current exam when compared to the prior study and corresponds to findings seen on the prior MRI. There is no evidence of associated mass effect or midline shift. Vascular: No hyperdense vessel or unexpected calcification. Skull: Normal. Negative for fracture or focal lesion. Sinuses/Orbits: No acute finding. Other: None. IMPRESSION: Small, ill-defined subacute infarct along the para thalamic region on the right. This is slightly more prominent on the current exam when compared to the prior study and corresponds to findings seen on the prior MRI. Electronically Signed   By: Virgina Norfolk M.D.   On: 02/08/2021 21:13   ECHOCARDIOGRAM COMPLETE  Result Date: 02/08/2021    ECHOCARDIOGRAM REPORT   Patient Name:   Lindsay Rice Date of Exam: 02/08/2021 Medical Rec #:  [id number]      Height:       65.0 in Accession #:    [id number]     Weight:       200.2 lb Date of Birth:  21-Dec-1950       BSA:          1.979 m Patient Age:    89 years       BP:           137/70 mmHg Patient Gender: F              HR:           95 bpm. Exam Location:  Inpatient Procedure: 2D  Echo, Cardiac Doppler and Color Doppler Indications:    TIA  History:        Patient has no prior history of Echocardiogram examinations.  Sonographer:    Merrie Roof RDCS Referring Phys: [id number] [place]  1. Left ventricular ejection fraction, by estimation, is 65 to 70%. The left ventricle has normal function. The left ventricle has no regional wall motion abnormalities. Left ventricular diastolic parameters are consistent with Grade I diastolic dysfunction (impaired relaxation).  2. Right ventricular systolic function is normal. The right ventricular size is normal.  3. The mitral valve is normal in structure. No evidence of mitral valve regurgitation. No evidence of mitral stenosis. Moderate to severe mitral annular calcification.  4. The aortic valve is tricuspid. There is mild thickening of the aortic valve. Aortic valve regurgitation is not visualized. Aortic valve sclerosis is present, with no evidence of aortic valve stenosis.  5. The inferior vena cava is normal in size with greater than 50% respiratory variability, suggesting right atrial pressure of 3 mmHg. FINDINGS  Left Ventricle: Left ventricular ejection fraction, by estimation, is 65 to 70%. The  left ventricle has normal function. The left ventricle has no regional wall motion abnormalities. The left ventricular internal cavity size was normal in size. There is  no left ventricular hypertrophy. Left ventricular diastolic parameters are consistent with Grade I diastolic dysfunction (impaired relaxation). Normal left ventricular filling pressure. Right Ventricle: The right ventricular size is normal. No increase in right ventricular wall thickness. Right ventricular systolic function is normal. Left Atrium: Left atrial size was normal in size. Right Atrium: Right atrial size was normal in size. Pericardium: There is no evidence of pericardial effusion. Mitral Valve: The mitral valve is normal in structure. Moderate to severe  mitral annular calcification. No evidence of mitral valve regurgitation. No evidence of mitral valve stenosis. Tricuspid Valve: The tricuspid valve is normal in structure. Tricuspid valve regurgitation is not demonstrated. No evidence of tricuspid stenosis. Aortic Valve: The aortic valve is tricuspid. There is mild thickening of the aortic valve. Aortic valve regurgitation is not visualized. Aortic valve sclerosis is present, with no evidence of aortic valve stenosis. Aortic valve mean gradient measures 5.0  mmHg. Aortic valve peak gradient measures 8.4 mmHg. Aortic valve area, by VTI measures 2.61 cm. Pulmonic Valve: The pulmonic valve was normal in structure. Pulmonic valve regurgitation is not visualized. No evidence of pulmonic stenosis. Aorta: The aortic root is normal in size and structure. Venous: The inferior vena cava was not well visualized. The inferior vena cava is normal in size with greater than 50% respiratory variability, suggesting right atrial pressure of 3 mmHg. IAS/Shunts: No atrial level shunt detected by color flow Doppler.  LEFT VENTRICLE PLAX 2D LVIDd:         3.50 cm   Diastology LVIDs:         2.40 cm   LV e' medial:    6.20 cm/s LV PW:         1.00 cm   LV E/e' medial:  7.7 LV IVS:        1.00 cm   LV e' lateral:   7.51 cm/s LVOT diam:     2.00 cm   LV E/e' lateral: 6.3 LV SV:         63 LV SV Index:   32 LVOT Area:     3.14 cm  LEFT ATRIUM           Index        RIGHT ATRIUM           Index LA diam:      3.70 cm 1.87 cm/m   RA Area:     12.30 cm LA Vol (A2C): 34.1 ml 17.23 ml/m  RA Volume:   27.00 ml  13.64 ml/m LA Vol (A4C): 48.8 ml 24.66 ml/m  AORTIC VALVE AV Area (Vmax):    2.60 cm AV Area (Vmean):   2.64 cm AV Area (VTI):     2.61 cm AV Vmax:           145.00 cm/s AV Vmean:          97.700 cm/s AV VTI:            0.242 m AV Peak Grad:      8.4 mmHg AV Mean Grad:      5.0 mmHg LVOT Vmax:         120.00 cm/s LVOT Vmean:        82.000 cm/s LVOT VTI:          0.201 m LVOT/AV VTI  ratio: 0.83  AORTA Ao  Root diam: 2.90 cm Ao Asc diam:  2.80 cm MITRAL VALVE MV Area (PHT): 4.17 cm    SHUNTS MV Decel Time: 182 msec    Systemic VTI:  0.20 m MV E velocity: 47.67 cm/s  Systemic Diam: 2.00 cm MV A velocity: 99.40 cm/s MV E/A ratio:  0.48 Mihai Croitoru MD Electronically signed by Sanda Klein MD Signature Date/Time: 02/08/2021/2:53:21 PM    Final      BP (!) 151/81 (BP Location: Right Arm)    Pulse 98    Temp 98.6 F (37 C) (Oral)    Resp 20    Ht 5\' 5"  (1.651 m)    Wt 90.8 kg    SpO2 96%    BMI 33.31 kg/m    Medical Problem List and Plan: 1.  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  -patient may shower  -ELOS/Goals: 7-10 days, mod I to supervision goals 2.  Antithrombotics: -DVT/anticoagulation:  Pharmaceutical: Lovenox  -antiplatelet therapy: Aspirin 81 mg daily and Plavix 75 mg daily x3 weeks then Plavix alone 3. Pain Management: Elavil 25 mg nightly, Neurontin 300 mg twice daily, Fioricet as needed. 4. Mood: BuSpar 5 mg twice daily, Zoloft 100 mg daily and 50 mg nightly  -moods seems up beat. Has supportive family  -antipsychotic agents: N/A 5. Neuropsych: This patient is capable of making decisions on her own behalf. 6. Skin/Wound Care: Routine skin checks 7. Fluids/Electrolytes/Nutrition: Routine in and outs with follow-up chemistries 8.  Permissive hypertension.  Currently on Norvasc 5 mg daily.  Patient also on HCTZ 25 mg daily, Cozaar 100 mg every morning, Lopressor 100 mg every morning and 50 mg nightly prior to admission.   -reasonable control at present -Resume as needed monitor with increased mobility 9.  History of migraine headaches.  Depakote 500 mg nightly,Ajovy 225 mg subcutaneous every 30 days 10.  OSA.  Continue inhalers as directed 11.  Hypothyroidism.  Synthroid 12.  Diabetes mellitus.  Hemoglobin A1c 8.3.  Jardiance 25 mg  daily.  Check blood sugars before meals and at bedtime  -reasonable control at present 13.  Hyperlipidemia.  Lipitor 14.  Obesity.  BMI 33.31.  Dietary follow-up   Cathlyn Parsons, PA-C 02/10/2021

## 2021-02-10 NOTE — TOC Transition Note (Signed)
Transition of Care [facility]) - CM/SW Discharge Note   Patient Details  Name: Lindsay Rice MRN: [id number] Date of Birth: 04/24/1950  [facility] CM/SW Contact:  Kermit Balo, RN Phone Number: 02/10/2021, 11:16 AM   Clinical Narrative:    Patient is discharging to CIR today. CM signing off.    Final next level of care: IP Rehab Facility Barriers to Discharge: No Barriers Identified   Patient Goals and CMS Choice     Choice offered to / list presented to : Patient  Discharge Placement                       Discharge Plan and Services                                     Social Determinants of Health (SDOH) Interventions     Readmission Risk Interventions No flowsheet data found.

## 2021-02-10 NOTE — Progress Notes (Signed)
Inpatient Rehabilitation Admissions Coordinator   CIR bed is available to admit her today. I have alerted acute team and TOC and will make the arrangements to admit today. I spoke with Dr Viviann Spare for medical clearance to d/c to CIR.  Ottie Glazier, RN, MSN Rehab Admissions Coordinator [phone] 02/10/2021 10:06 AM

## 2021-02-10 NOTE — Plan of Care (Signed)
  Problem: Education: Goal: Knowledge of General Education information will improve Description: Including pain rating scale, medication(s)/side effects and non-pharmacologic comfort measures Outcome: Progressing   Problem: Health Behavior/Discharge Planning: Goal: Ability to manage health-related needs will improve Outcome: Progressing   Problem: Clinical Measurements: Goal: Ability to maintain clinical measurements within normal limits will improve Outcome: Progressing Goal: Will remain free from infection Outcome: Progressing Goal: Diagnostic test results will improve Outcome: Progressing Goal: Respiratory complications will improve Outcome: Progressing Goal: Cardiovascular complication will be avoided Outcome: Progressing   Problem: Activity: Goal: Risk for activity intolerance will decrease Outcome: Progressing   Problem: Nutrition: Goal: Adequate nutrition will be maintained Outcome: Progressing   Problem: Coping: Goal: Level of anxiety will decrease Outcome: Progressing   Problem: Elimination: Goal: Will not experience complications related to bowel motility Outcome: Progressing Goal: Will not experience complications related to urinary retention Outcome: Progressing   Problem: Pain Managment: Goal: General experience of comfort will improve Outcome: Progressing   Problem: Safety: Goal: Ability to remain free from injury will improve Outcome: Progressing   Problem: Skin Integrity: Goal: Risk for impaired skin integrity will decrease Outcome: Progressing   Problem: Education: Goal: Knowledge of disease or condition will improve Outcome: Progressing Goal: Knowledge of secondary prevention will improve (SELECT ALL) Outcome: Progressing Goal: Knowledge of patient specific risk factors will improve (INDIVIDUALIZE FOR PATIENT) Outcome: Progressing Goal: Individualized Educational Video(s) Outcome: Progressing   Problem: Coping: Goal: Will verbalize  positive feelings about self Outcome: Progressing Goal: Will identify appropriate support needs Outcome: Progressing   Problem: Health Behavior/Discharge Planning: Goal: Ability to manage health-related needs will improve Outcome: Progressing   Problem: Self-Care: Goal: Ability to participate in self-care as condition permits will improve Outcome: Progressing Goal: Verbalization of feelings and concerns over difficulty with self-care will improve Outcome: Progressing Goal: Ability to communicate needs accurately will improve Outcome: Progressing   Problem: Ischemic Stroke/TIA Tissue Perfusion: Goal: Complications of ischemic stroke/TIA will be minimized Outcome: Progressing   

## 2021-02-10 NOTE — Progress Notes (Signed)
Physical Therapy Treatment Patient Details Name: Lindsay Rice MRN: [id number] DOB: January 07, 1951 Today's Date: 02/10/2021   History of Present Illness Pt is a 70 y/o female admitted 12/20 with acute onset L sided weakness. 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    PT Comments    Pt showing off the progress she has made with L UE.  Emphasis on sit to stands, pre-gait activity and progression of gait with work on L knee control, heel/toe gait and overall quality of gait.    Recommendations for follow up therapy are one component of a multi-disciplinary discharge planning process, led by the attending physician.  Recommendations may be updated based on patient status, additional functional criteria and insurance authorization.  Follow Up Recommendations  Acute inpatient rehab (3hours/day)     Assistance Recommended at Discharge Frequent or constant Supervision/Assistance  Equipment Recommendations  Other (comment)    Recommendations for Other Services       Precautions / Restrictions Precautions Precautions: Fall     Mobility  Bed Mobility               General bed mobility comments: up in the chair on arrival    Transfers Overall transfer level: Needs assistance   Transfers: Sit to/from Stand Sit to Stand: Min assist           General transfer comment: cues for safety, assist forward with minimal boost.    Ambulation/Gait Ambulation/Gait assistance: Mod assist Gait Distance (Feet): 90 Feet Assistive device: Rolling walker (2 wheels) Gait Pattern/deviations: Step-to pattern;Step-through pattern;Decreased step length - right;Decreased step length - left;Decreased stance time - left;Decreased stride length   Gait velocity interpretation: <1.8 ft/sec, indicate of risk for recurrent falls   General Gait Details: cues for sequencing, min to mod assist for stability, control of L LE at knee extention.  Generally  paretic gait.   Stairs             Wheelchair Mobility    Modified Rankin (Stroke Patients Only) Modified Rankin (Stroke Patients Only) Pre-Morbid Rankin Score: No symptoms Modified Rankin: Moderately severe disability     Balance Overall balance assessment: Needs assistance Sitting-balance support: No upper extremity supported;Feet supported Sitting balance-Leahy Scale: Fair     Standing balance support: Single extremity supported;Bilateral upper extremity supported;During functional activity Standing balance-Leahy Scale: Poor Standing balance comment: work on pregait activity in the RW for w/shift and stepping control prior to gait.                            Cognition Arousal/Alertness: Awake/alert Behavior During Therapy: WFL for tasks assessed/performed Overall Cognitive Status: Within Functional Limits for tasks assessed                                          Exercises      General Comments General comments (skin integrity, edema, etc.): vss on RA      Pertinent Vitals/Pain Pain Assessment: Faces Faces Pain Scale: No hurt Pain Intervention(s): Monitored during session    Home Living                          Prior Function            PT Goals (current goals can now be found  in the care plan section) Acute Rehab PT Goals PT Goal Formulation: With patient Time For Goal Achievement: 02/23/21 Potential to Achieve Goals: Good Progress towards PT goals: Progressing toward goals    Frequency    Min 4X/week      PT Plan Current plan remains appropriate    Co-evaluation              AM-PAC PT "6 Clicks" Mobility   Outcome Measure  Help needed turning from your back to your side while in a flat bed without using bedrails?: A Little Help needed moving from lying on your back to sitting on the side of a flat bed without using bedrails?: A Lot Help needed moving to and from a bed to a chair  (including a wheelchair)?: A Little Help needed standing up from a chair using your arms (e.g., wheelchair or bedside chair)?: A Little Help needed to walk in hospital room?: A Lot Help needed climbing 3-5 steps with a railing? : Total 6 Click Score: 14    End of Session   Activity Tolerance: Patient tolerated treatment well;Patient limited by fatigue Patient left: in chair;with call bell/phone within reach Nurse Communication: Mobility status PT Visit Diagnosis: Unsteadiness on feet (R26.81);Other abnormalities of gait and mobility (R26.89);Hemiplegia and hemiparesis Hemiplegia - Right/Left: Left Hemiplegia - dominant/non-dominant: Non-dominant Hemiplegia - caused by: Cerebral infarction     Time: 6270-3500 PT Time Calculation (min) (ACUTE ONLY): 20 min  Charges:  $Neuromuscular Re-education: 8-22 mins                     02/10/2021  Jacinto Halim., PT Acute Rehabilitation Services [phone]  (pager) [phone]  (office)   Eliseo Gum Tyjai Charbonnet 02/10/2021, 2:57 PM

## 2021-02-10 NOTE — Care Management Important Message (Signed)
Important Message  Patient Details  Name: Lindsay Rice MRN: [id number] Date of Birth: February 01, 1951   Medicare Important Message Given:  Yes     Haidar Muse 02/10/2021, 3:20 PM

## 2021-02-10 NOTE — Discharge Instructions (Addendum)
Inpatient Rehab Discharge Instructions  Lindsay Rice Discharge date and time: 02/20/21   Activities/Precautions/ Functional Status: Activity: activity as tolerated Diet: cardiac diet and diabetic diet Wound Care: Routine skin checks   Functional status:  ___ No restrictions     ___ Walk up steps independently ___ 24/7 supervision/assistance   ___ Walk up steps with assistance _X__ Intermittent supervision/assistance  ___ Bathe/dress independently ___ Walk with walker     ___ Bathe/dress with assistance ___ Walk Independently    ___ Shower independently ___ Walk with assistance    ___ Shower with assistance _X__ No alcohol     ___ Return to work/school ________   Special Instructions: No driving, smoking or alcohol 2. Stop taking Aspirin 81 mg after 03/03/21  3. Monitor blood sugars twice a day and follow up with PCP for input. Need to drink plenty of water and limit sugars as Jardiance will put you at risk for yeast infections.  STROKE/TIA DISCHARGE INSTRUCTIONS SMOKING Cigarette smoking nearly doubles your risk of having a stroke & is the single most alterable risk factor  If you smoke or have smoked in the last 12 months, you are advised to quit smoking for your health. Most of the excess cardiovascular risk related to smoking disappears within a year of stopping. Ask you doctor about anti-smoking medications [facility] Quit Line: 1-800-QUIT NOW Free Smoking Cessation Classes [phone]  CHOLESTEROL Know your levels; limit fat & cholesterol in your diet  Lipid Panel     Component Value Date/Time   CHOL 159 02/08/2021 0500   TRIG 189 (H) 02/08/2021 0500   HDL 39 (L) 02/08/2021 0500   CHOLHDL 4.1 02/08/2021 0500   VLDL 38 02/08/2021 0500   LDLCALC 82 02/08/2021 0500     Many patients benefit from treatment even if their cholesterol is at goal. Goal: Total Cholesterol (CHOL) less than 160 Goal:  Triglycerides (TRIG) less than 150 Goal:  HDL greater than 40 Goal:  LDL  (LDLCALC) less than 100   BLOOD PRESSURE American Stroke Association blood pressure target is less that 120/80 mm/Hg  Your discharge blood pressure is:    Monitor your blood pressure Limit your salt and alcohol intake Many individuals will require more than one medication for high blood pressure  DIABETES (A1c is a blood sugar average for last 3 months) Goal HGBA1c is under 7% (HBGA1c is blood sugar average for last 3 months)  Diabetes:    Lab Results  Component Value Date   HGBA1C 8.3 (H) 02/08/2021    Your HGBA1c can be lowered with medications, healthy diet, and exercise. Check your blood sugar as directed by your physician Call your physician if you experience unexplained or low blood sugars.  PHYSICAL ACTIVITY/REHABILITATION Goal is 30 minutes at least 4 days per week  Activity: Increase activity slowly. No Driving.  Therapies: See below   Return to work: N/A Activity decreases your risk of heart attack and stroke and makes your heart stronger.  It helps control your weight and blood pressure; helps you relax and can improve your mood. Participate in a regular exercise program. Talk with your doctor about the best form of exercise for you (dancing, walking, swimming, cycling).  DIET/WEIGHT Goal is to maintain a healthy weight  Your discharge diet is:  Diet Order             Diet Carb Modified Fluid consistency: Thin; Room service appropriate? Yes with Assist  Diet effective now  liquids Your height is:   5'5" Your current weight is:  196 lbs Your Body Mass Index (BMI) is:   32.7  Following the type of diet specifically designed for you will help prevent another stroke. Your goal weight is 150 lbs    Your goal Body Mass Index (BMI) is 19-24. Healthy food habits can help reduce 3 risk factors for stroke:  High cholesterol, hypertension, and excess weight.  RESOURCES Stroke/Support Group:  Call [phone]   STROKE EDUCATION PROVIDED/REVIEWED AND GIVEN  TO PATIENT Stroke warning signs and symptoms How to activate emergency medical system (call 911). Medications prescribed at discharge. Need for follow-up after discharge. Personal risk factors for stroke. Pneumonia vaccine given: No Flu vaccine given: No My questions have been answered, the writing is legible, and I understand these instructions.  I will adhere to these goals & educational materials that have been provided to me after my discharge from the hospital.      COMMUNITY REFERRALS UPON DISCHARGE:    Home Health:   PT & OT                  Agency: [facility] Phone: [phone]    Medical Equipment/Items Ordered: Levan Hurst                                                 Agency/Supplier: [facility]  [phone]   My questions have been answered and I understand these instructions. I will adhere to these goals and the provided educational materials after my discharge from the hospital.  Patient/Caregiver Signature _______________________________ Date __________  Clinician Signature _______________________________________ Date __________  Please bring this form and your medication list with you to all your follow-up doctor's appointments.

## 2021-02-10 NOTE — Progress Notes (Signed)
Inpatient Rehabilitation Admission Medication Review by a Pharmacist  A complete drug regimen review was completed for this patient to identify any potential clinically significant medication issues.  High Risk Drug Classes Is patient taking? Indication by Medication  Antipsychotic No   Anticoagulant Yes Lovenox for VTE ppx  Antibiotic No   Opioid No   Antiplatelet Yes Aspirin, Plavix for Stroke  Hypoglycemics/insulin Yes Jardiance, SSI for DM  Vasoactive Medication Yes Amlodpine for BP  Chemotherapy No   Other Yes Lipitor for HLD Depakote, Elavil, Zoloft, Buspar for mood Synthroid for low thyroid Dulera for asthma     Type of Medication Issue Identified Description of Issue Recommendation(s)  Drug Interaction(s) (clinically significant)     Duplicate Therapy     Allergy     No Medication Administration End Date     Incorrect Dose     Additional Drug Therapy Needed     Significant med changes from prior encounter (inform family/care partners about these prior to discharge).    Other       Clinically significant medication issues were identified that warrant physician communication and completion of prescribed/recommended actions by midnight of the next day:  No   Pharmacist comments: Added stop date for aspirin  Time spent performing this drug regimen review (minutes):  20 mnutes   Elwin Sleight 02/10/2021 3:17 PM

## 2021-02-11 DIAGNOSIS — Z8669 Personal history of other diseases of the nervous system and sense organs: Secondary | ICD-10-CM

## 2021-02-11 LAB — GLUCOSE, CAPILLARY
Glucose-Capillary: 167 mg/dL — ABNORMAL HIGH (ref 70–99)
Glucose-Capillary: 151 mg/dL — ABNORMAL HIGH (ref 70–99)
Glucose-Capillary: 185 mg/dL — ABNORMAL HIGH (ref 70–99)
Glucose-Capillary: 173 mg/dL — ABNORMAL HIGH (ref 70–99)

## 2021-02-11 NOTE — Progress Notes (Signed)
PROGRESS NOTE   Subjective/Complaints: Pt had a pretty good night. Ready for therapies today. Working on grip exercises using left hand  ROS: Patient denies fever, rash, sore throat, blurred vision, nausea, vomiting, diarrhea, cough, shortness of breath or chest pain, joint or back pain, headache, or mood change.    Objective:   No results found. No results for input(s): WBC, HGB, HCT, PLT in the last 72 hours. No results for input(s): NA, K, CL, CO2, GLUCOSE, BUN, CREATININE, CALCIUM in the last 72 hours.  Intake/Output Summary (Last 24 hours) at 02/11/2021 1319 Last data filed at 02/11/2021 0859 Gross per 24 hour  Intake 480 ml  Output --  Net 480 ml        Physical Exam: Vital Signs Blood pressure (!) 162/84, pulse 98, temperature 98.2 F (36.8 C), resp. rate 16, height 5\' 5"  (1.651 m), SpO2 97 %.  General: Alert and oriented x 3, No apparent distress HEENT: Head is normocephalic, atraumatic, PERRLA, EOMI, sclera anicteric, oral mucosa pink and moist, dentition intact, ext ear canals clear,  Neck: Supple without JVD or lymphadenopathy Heart: Reg rate and rhythm. No murmurs rubs or gallops Chest: CTA bilaterally without wheezes, rales, or rhonchi; no distress Abdomen: Soft, non-tender, non-distended, bowel sounds positive. Extremities: No clubbing, cyanosis, or edema. Pulses are 2+ Psych: Pt's affect is appropriate. Pt is cooperative Skin: Clean and intact without signs of breakdown Neuro:  Alert and oriented x 3. Normal insight and awareness. Intact Memory. Normal language and speech. Cranial nerve exam unremarkable except for left central 7 and left perioral numbness. RUE and RLE 5/5. LUE 3-+ to 3/5 prox to distal. LLE 4- prox to 4/5 distally. Decreased sensory loss along left hand. Musculoskeletal: Full ROM, No pain with AROM or PROM in the neck, trunk, or extremities. Posture appropriate     Assessment/Plan: 1.  Functional deficits which require 3+ hours per day of interdisciplinary therapy in a comprehensive inpatient rehab setting. Physiatrist is providing close team supervision and 24 hour management of active medical problems listed below. Physiatrist and rehab team continue to assess barriers to discharge/monitor patient progress toward functional and medical goals  Care Tool:  Bathing    Body parts bathed by patient: Right arm, Left arm, Chest, Abdomen, Front perineal area, Buttocks, Right upper leg, Left upper leg, Right lower leg, Left lower leg, Face         Bathing assist Assist Level: Minimal Assistance - Patient > 75%     Upper Body Dressing/Undressing Upper body dressing   What is the patient wearing?: Bra, Pull over shirt    Upper body assist Assist Level: Minimal Assistance - Patient > 75%    Lower Body Dressing/Undressing Lower body dressing      What is the patient wearing?: Hospital gown only, Pants     Lower body assist Assist for lower body dressing: Minimal Assistance - Patient > 75%     Toileting Toileting    Toileting assist Assist for toileting: Minimal Assistance - Patient > 75%     Transfers Chair/bed transfer  Transfers assist     Chair/bed transfer assist level: Minimal Assistance - Patient > 75%  Locomotion Ambulation   Ambulation assist              Walk 10 feet activity   Assist           Walk 50 feet activity   Assist           Walk 150 feet activity   Assist           Walk 10 feet on uneven surface  activity   Assist           Wheelchair     Assist               Wheelchair 50 feet with 2 turns activity    Assist            Wheelchair 150 feet activity     Assist          Blood pressure (!) 162/84, pulse 98, temperature 98.2 F (36.8 C), resp. rate 16, height 5\' 5"  (1.651 m), SpO2 97 %.  Medical Problem List and Plan: 1.  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             -patient may shower             -ELOS/Goals: 7-10 days, mod I to supervision goals  Patient is beginning CIR therapies today including PT and OT  2.  Antithrombotics: -DVT/anticoagulation:  Pharmaceutical: Lovenox             -antiplatelet therapy: Aspirin 81 mg daily and Plavix 75 mg daily x3 weeks then Plavix alone 3. Pain Management: Elavil 25 mg nightly, Neurontin 300 mg twice daily, Fioricet as needed. 4. Mood: BuSpar 5 mg twice daily, Zoloft 100 mg daily and 50 mg nightly             -moods seems up beat. Family appears supportive.              -antipsychotic agents: N/A 5. Neuropsych: This patient is capable of making decisions on her own behalf. 6. Skin/Wound Care: Routine skin checks 7. Fluids/Electrolytes/Nutrition: Routine in and outs with follow-up chemistries 8.  Permissive hypertension.  Currently on Norvasc 5 mg daily.  Patient also on HCTZ 25 mg daily, Cozaar 100 mg every morning, Lopressor 100 mg every morning and 50 mg nightly prior to admission.   -in "range" at present. Continue to follow 9.  History of migraine headaches.  Depakote 500 mg nightly,Ajovy 225 mg subcutaneous every 30 days 10.  OSA.  Continue inhalers as directed 11.  Hypothyroidism.  Synthroid 12.  Diabetes mellitus.  Hemoglobin A1c 8.3.  Jardiance 25 mg daily.  Check blood sugars before meals and at bedtime  CBG (last 3)  Recent Labs    02/10/21 2057 02/11/21 0630 02/11/21 1254  GLUCAP 219* 167* 151*   12/24 -monitor with jardiance on board. Seems to need better control    -consider amaryl trial 13.  Hyperlipidemia.  Lipitor 14.  Obesity.  BMI 33.31.  Dietary follow-up    LOS: 1 days A FACE TO FACE EVALUATION WAS PERFORMED  1/25 02/11/2021, 1:19 PM

## 2021-02-11 NOTE — Evaluation (Signed)
Speech Language Pathology Assessment and Plan ° °Patient Details  °Name: Lindsay Rice °MRN: [id number] °Date of Birth: 10/14/1950 ° °SLP Diagnosis: Cognitive Impairments  °Rehab Potential: Excellent °ELOS: 12-14 days  ° °Today's Date: 02/11/2021 °SLP Individual Time: 1405-1455 °SLP Individual Time Calculation (min): 50 min ° °Hospital Problem: Principal Problem: °  Infarction of right basal ganglia (HCC) ° °Past Medical History:  °Past Medical History:  °Diagnosis Date  ° Diabetes mellitus without complication (HCC)   ° Hypertension   ° Migraine headache   ° Mitral valve prolapse   ° Obesity (BMI 30.0-34.9)   ° Sleep apnea   ° °Past Surgical History: History reviewed. No pertinent surgical history. ° °Assessment / Plan / Recommendation °Clinical Impression  Lindsay Rice is a 70-year-old right-handed female with history of migraine headaches, diabetes mellitus, obesity with BMI 33.31, hypertension, hyperlipidemia, mitral valve prolapse, OSA. Presented 02/07/2021 with acute onset of left-sided weakness while getting out of her car resulting in a fall.  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 Tolerating a regular consistency diet.  Therapy evaluations completed due to patient's left-sided weakness decreased functional mobility was admitted for a comprehensive rehab program. Transferred to CIR 02/10/2021. ° °Pt presents with mild cognitive impairment in areas of complex problem solving, executive function, organizing and alternating attention. Pt demonstrates mild impulsivity with answers during evaluation (likely baseline character), when cued to slow responses, accuracy of tasks increased. Pt participating in portions of the ALFA requiring Supervision-Min A cues for problem solving. Functional memory appears intact for daily tasks, deficits in attention impacts executive  function skills at this time. Pt is very motivated to participate in therapy, reports feeling very close to baseline for cognitive function but wants to work on med management and money management. 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 Pt will benefit from skilled ST to increase safety and independence with daily routine.   °  °Skilled Therapeutic Interventions          Pt participating in portions of ALFA and other non-standardized assessments of cognitive linguistic function. Please see above. °  °SLP Assessment ° Patient will need skilled Speech Lanaguage Pathology Services during CIR admission  °  °Recommendations ° Patient destination: Home °Follow up Recommendations: None (close to baseline at eval) °Equipment Recommended: None recommended by SLP  °  °SLP Frequency 1 to 3 out of 7 days   °SLP Duration ° °SLP Intensity ° °SLP Treatment/Interventions 12-14 days ° °Minumum of 1-2 x/day, 30 to 90 minutes ° °Cognitive remediation/compensation;Therapeutic Exercise;Therapeutic Activities;Patient/family education;Functional tasks;Cueing hierarchy   ° °Pain °Pain Assessment °Pain Scale: 0-10 °Pain Score: 0-No pain ° °Prior Functioning °Cognitive/Linguistic Baseline: Within functional limits °Type of Home: House ° Lives With: Spouse °Available Help at Discharge: Family;Available 24 hours/day °Vocation: Retired ° °SLP Evaluation °Cognition °Overall Cognitive Status: Impaired/Different from baseline °Arousal/Alertness: Awake/alert °Orientation Level: Oriented X4 °Year: 2022 °Month: December °Day of Week: Correct °Attention: Alternating;Selective °Sustained Attention: Appears intact °Sustained Attention Impairment: Verbal complex;Functional complex °Selective Attention: Impaired °Selective Attention Impairment: Verbal complex;Functional complex °Alternating Attention: Impaired °Alternating Attention Impairment: Verbal complex;Functional complex °Memory:  Appears intact °Immediate Memory Recall: Sock;Blue;Bed °Memory Recall Sock: Without Cue °Memory Recall Blue: Without Cue °Memory Recall Bed: Without Cue °Awareness: Appears intact °Problem Solving: Appears intact °Executive Function: Organizing °Organizing: Impaired °Organizing Impairment: Verbal complex;Functional complex °Behaviors:   Impulsive (mild, likely baseline) Safety/Judgment: Impaired Comments: slightly impulsive  Comprehension Auditory Comprehension Overall Auditory Comprehension: Appears within functional limits for tasks assessed Yes/No Questions: Within Functional Limits Commands: Within Functional Limits Expression Expression Primary Mode of Expression: Verbal Verbal Expression Overall Verbal Expression: Appears within functional limits for tasks assessed Oral Motor Oral Motor/Sensory Function Overall Oral Motor/Sensory Function: Mild impairment Facial ROM: Reduced left Facial Symmetry: Abnormal symmetry left Motor Speech Overall Motor Speech: Appears within functional limits for tasks assessed Respiration: Within functional limits  Care Tool Care Tool Cognition Ability to hear (with hearing aid or hearing appliances if normally used Ability to hear (with hearing aid or hearing appliances if normally used): 0. Adequate - no difficulty in normal conservation, social interaction, listening to TV   Expression of Ideas and Wants Expression of Ideas and Wants: 4. Without difficulty (complex and basic) - expresses complex messages without difficulty and with speech that is clear and easy to understand   Understanding Verbal and Non-Verbal Content Understanding Verbal and Non-Verbal Content: 4. Understands (complex and basic) - clear comprehension without cues or repetitions  Memory/Recall Ability Memory/Recall Ability : Current season;Staff names and faces;Location of own room;That he or she is in a hospital/hospital unit   Short Term Goals: Week 1: SLP Short Term Goal 1 (Week  1): Patient will complete complex problem solving tasks with sup A verbal cues SLP Short Term Goal 2 (Week 1): Patient will complete simulated medication/financial management tasks with sup A verbal cues to achieve 90% accuracy SLP Short Term Goal 3 (Week 1): Pt will alternate attention between 2 functional tasks provided Supervision A verbal cues  Refer to Care Plan for Long Term Goals  Recommendations for other services: None   Discharge Criteria: Patient will be discharged from SLP if patient refuses treatment 3 consecutive times without medical reason, if treatment goals not met, if there is a change in medical status, if patient makes no progress towards goals or if patient is discharged from hospital.  The above assessment, treatment plan, treatment alternatives and goals were discussed and mutually agreed upon: by patient  Dewaine Conger 02/11/2021, 2:40 PM

## 2021-02-11 NOTE — Evaluation (Signed)
Physical Therapy Assessment and Plan  Patient Details  Name: Lindsay Rice MRN: [id number] Date of Birth: 08-03-1950  PT Diagnosis: Hemiparesis non-dominant, Impaired cognition, and Muscle weakness Rehab Potential: Good ELOS: 10-14 days   Today's Date: 02/11/2021 PT Individual Time: 7408-1448 PT Individual Time Calculation (min): 76 min    Hospital Problem: Principal Problem:   Infarction of right basal ganglia ([place])   Past Medical History:  Past Medical History:  Diagnosis Date   Diabetes mellitus without complication ([place])    Hypertension    Migraine headache    Mitral valve prolapse    Obesity (BMI 30.0-34.9)    Sleep apnea    Past Surgical History: History reviewed. No pertinent surgical history.  Assessment & Plan Clinical Impression: Patient is a 70 y.o. right-handed female with history of migraine headaches maintained on ajovy subcutaneous every 30 days as well as Depakote, diabetes mellitus, obesity with BMI 33.31, hypertension, hyperlipidemia, mitral valve prolapse, OSA.  Per chart review patient lives with spouse.  Two-level home bed and bath main level 3 steps to entry.  Independent prior to admission.  Presented 02/07/2021 with acute onset of left-sided weakness while getting out of her car resulting in a fall.  She was able to get into her home and went to the bathroom and fell again without loss of consciousness.  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  MRA of the head unremarkable no large vessel occlusion.  MRA of the neck negative without dissection or occlusion.  Patient did receive TNKase.  Admission chemistries unremarkable except glucose 373, creatinine 1.01, WBC 10,600, hemoglobin A1c 8.3.  Echocardiogram with ejection fraction of 65 to 70% no wall motion abnormalities grade 1 diastolic dysfunction.  Neurology follow-up  maintained on aspirin 81 mg daily and Plavix 75 mg daily x3 weeks followed by Plavix alone.  Subcutaneous Lovenox for DVT prophylaxis.  Tolerating a regular consistency diet.  Therapy evaluations completed due to patient's left-sided weakness decreased functional mobility was admitted for a comprehensive rehab program..  Patient transferred to CIR on 02/10/2021 .   Patient currently requires min assist with mobility secondary to muscle weakness, decreased cardiorespiratoy endurance, impaired timing and sequencing, unbalanced muscle activation, and decreased coordination, decreased motor planning, decreased awareness, decreased problem solving, and decreased safety awareness, and decreased standing balance and decreased balance strategies as well as hemipareisis.  Prior to hospitalization, patient was independent  with mobility and lived with Spouse in a House home.  Home access has 3Stairs to enter.  Patient will benefit from skilled PT intervention to maximize safe functional mobility, minimize fall risk, and decrease caregiver burden for planned discharge home with 24 hour supervision.  Anticipate patient will benefit from follow up OP at discharge.  PT - End of Session Activity Tolerance: Tolerates 30+ min activity with multiple rests Endurance Deficit: Yes Endurance Deficit Description: rest breaks required throughout session PT Assessment Rehab Potential (ACUTE/IP ONLY): Good PT Barriers to Discharge: [place] home environment;Decreased caregiver support;Home environment access/layout;Incontinence;Insurance for SNF coverage PT Patient demonstrates impairments in the following area(s): Balance;Behavior;Endurance;Motor;Perception;Safety;Sensory PT Transfers Functional Problem(s): Bed Mobility;Bed to Chair;Car;Furniture PT Locomotion Functional Problem(s): Ambulation;Stairs PT Plan PT Intensity: Minimum of 1-2 x/day ,45 to 90 minutes PT Frequency: 5 out of 7 days PT Duration Estimated Length  of Stay: 10-14 days PT Treatment/Interventions: Ambulation/gait training;Community reintegration;DME/adaptive equipment instruction;Neuromuscular re-education;Psychosocial support;Stair training;UE/LE Strength taining/ROM;Wheelchair propulsion/positioning;UE/LE Coordination activities;Therapeutic Activities;Skin  care/wound management;Pain management;Functional electrical stimulation;Discharge planning;Balance/vestibular training;Cognitive remediation/compensation;Disease management/prevention;Functional mobility training;Patient/family education;Splinting/orthotics;Therapeutic Exercise;Visual/perceptual remediation/compensation PT Transfers Anticipated Outcome(s): supervision PT Locomotion Anticipated Outcome(s): supervision/ CGA PT Recommendation Recommendations for Other Services: Therapeutic Recreation consult Therapeutic Recreation Interventions: Pet therapy;Kitchen group;Stress management;Outing/community reintergration Follow Up Recommendations: Home health PT;Outpatient PT Patient destination: Home Equipment Recommended: To be determined   PT Evaluation Precautions/Restrictions Precautions Precaution Comments: L hemipareisis UE>LE, impulsive Restrictions Weight Bearing Restrictions: No General   Vital SignsTherapy Vitals Temp: 98.1 F (36.7 C) Temp Source: Oral Pulse Rate: (!) 103 Resp: 18 BP: 134/77 Patient Position (if appropriate): Lying Oxygen Therapy SpO2: 95 % O2 Device: Room Air;Nasal Cannula Pain Pain Assessment Pain Scale: 0-10 Pain Score: 0-No pain Pain Interference Pain Interference Pain Effect on Sleep: 1. Rarely or not at all Pain Interference with Therapy Activities: 1. Rarely or not at all Pain Interference with Day-to-Day Activities: 1. Rarely or not at all Home Living/Prior [place] Available Help at Discharge: Family;Available 24 hours/day Type of Home: House Home Access: Stairs to enter [employer] of Steps: 3 Entrance  Stairs-Rails: Left Home Layout: Two level;Laundry or work area in basement;Able to live on main level with bedroom/bathroom Alternate Therapist, sports of Steps: flight Bathroom Shower/Tub: Multimedia programmer: Handicapped height Bathroom Accessibility: Yes Additional Comments: Has a seat in the shower  Lives With: Spouse Prior Function Level of Independence: Independent with basic ADLs;Independent with homemaking with ambulation;Independent with gait;Independent with transfers  Able to Take Stairs?: Reciprically Driving: Yes Vocation: 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, water quality at [employer] prior to knee surgery in march of 2022 Vision/Perception  Vision - History Ability to See in Adequate Light: 0 Adequate Perception Perception: Within Functional Limits Praxis Praxis: Intact  Cognition Overall Cognitive Status: Impaired/Different from baseline Arousal/Alertness: Awake/alert Orientation Level: Oriented X4 Year: 2022 Month: December Day of Week: Correct Attention: Alternating;Selective;Sustained Sustained Attention: Appears intact Selective Attention: Impaired Alternating Attention: Impaired Memory: Impaired Awareness: Impaired Awareness Impairment: Anticipatory impairment Problem Solving: Impaired Problem Solving Impairment: Functional basic Executive Function: Organizing Behaviors: Impulsive Safety/Judgment: Impaired Comments: impulsive during transfers Sensation Sensation Light Touch: Appears Intact (for BLE) Coordination Gross Motor Movements are Fluid and Coordinated: No Fine Motor Movements are Fluid and Coordinated: No Finger Nose Finger Test: L dysmetriawith arm extended to finger Heel Shin Test: [facility] bilaterally Motor  Motor Motor: Ataxia;Other (comment) (hemipareisis) Motor - Skilled Clinical Observations: L hemipareisis   Trunk/Postural Assessment  Cervical Assessment Cervical Assessment: Exceptions to [facility] (forward  head) Thoracic Assessment Thoracic Assessment: Exceptions to [facility] (rounded shoulders, L shoulder lower than R) Lumbar Assessment Lumbar Assessment: Exceptions to [facility] (posterior pelvic tilt in sitting) Postural Control Postural Control: Within Functional Limits  Balance Balance Balance Assessed: Yes Static Sitting Balance Static Sitting - Balance Support: Feet supported Static Sitting - Level of Assistance: 5: Stand by assistance Dynamic Sitting Balance Dynamic Sitting - Balance Support: Feet supported;During functional activity Dynamic Sitting - Level of Assistance: 5: Stand by assistance Static Standing Balance Static Standing - Balance Support: During functional activity Static Standing - Level of Assistance: 4: Min assist;5: Stand by assistance Dynamic Standing Balance Dynamic Standing - Balance Support: During functional activity Dynamic Standing - Level of Assistance: 4: Min assist Extremity Assessment      RLE Assessment RLE Assessment: Exceptions to [facility] RLE Strength RLE Overall Strength: Deficits Right Hip Flexion: 4+/5 Right Hip Extension: 4/5 Right Hip ABduction: 4+/5 Right Hip ADduction: 4/5 Right Knee Flexion: 4+/5 Right Knee Extension: 5/5 Right Ankle Dorsiflexion: 4+/5 Right  Ankle Plantar Flexion: 4/5 LLE Assessment LLE Assessment: Exceptions to [facility] LLE Strength LLE Overall Strength: Deficits Left Hip Flexion: 4-/5 Left Hip Extension: 3+/5 Left Hip ABduction: 3+/5 Left Hip ADduction: 4/5 Left Knee Flexion: 3+/5 Left Knee Extension: 4/5 Left Ankle Dorsiflexion: 4-/5 Left Ankle Plantar Flexion: 3+/5  Care Tool Care Tool Bed Mobility Roll left and right activity   Roll left and right assist level: Contact Guard/Touching assist    Sit to lying activity   Sit to lying assist level: Minimal Assistance - Patient > 75%    Lying to sitting on side of bed activity   Lying to sitting on side of bed assist level: the ability to move from lying on the back to  sitting on the side of the bed with no back support.: Minimal Assistance - Patient > 75%     Care Tool Transfers Sit to stand transfer   Sit to stand assist level: Minimal Assistance - Patient > 75%    Chair/bed transfer   Chair/bed transfer assist level: Minimal Assistance - Patient > 75%     Toilet transfer   Assist Level: Minimal Assistance - Patient > 75%    Car transfer   Car transfer assist level: Minimal Assistance - Patient > 75%      Care Tool Locomotion Ambulation   Assist level: Minimal Assistance - Patient > 75% Assistive device: Walker-rolling    Walk 10 feet activity   Assist level: Minimal Assistance - Patient > 75% Assistive device: Walker-rolling   Walk 50 feet with 2 turns activity   Assist level: Minimal Assistance - Patient > 75% Assistive device: Walker-rolling  Walk 150 feet activity Walk 150 feet activity did not occur: Safety/medical concerns      Walk 10 feet on uneven surfaces activity Walk 10 feet on uneven surfaces activity did not occur: Safety/medical concerns      Stairs   Assist level: Minimal Assistance - Patient > 75% Stairs assistive device: 2 hand rails Max number of stairs: 4  Walk up/down 1 step activity   Walk up/down 1 step (curb) assist level: Minimal Assistance - Patient > 75% Walk up/down 1 step or curb assistive device: 2 hand rails  Walk up/down 4 steps activity   Walk up/down 4 steps assist level: Minimal Assistance - Patient > 75% Walk up/down 4 steps assistive device: 2 hand rails  Walk up/down 12 steps activity Walk up/down 12 steps activity did not occur: Safety/medical concerns      Pick up small objects from floor Pick up small object from the floor (from standing position) activity did not occur: Safety/medical concerns      Wheelchair Is the patient using a wheelchair?: Yes Type of Wheelchair: Manual   Wheelchair assist level: Total Assistance - Patient < 25% Max wheelchair distance: 300 ft  Wheel 50 feet  with 2 turns activity   Assist Level: Total Assistance - Patient < 25%  Wheel 150 feet activity   Assist Level: Total Assistance - Patient < 25%    Refer to Care Plan for Long Term Goals  SHORT TERM GOAL WEEK 1    Recommendations for other services: Therapeutic Recreation  Kitchen group, Stress management, and Outing/community reintegration  Skilled Therapeutic Intervention Mobility Bed Mobility Bed Mobility: Supine to Sit;Rolling Left;Sit to Supine Rolling Left: Contact Guard/Touching assist Supine to Sit: Contact Guard/Touching assist;Minimal Assistance - Patient > 75% Sit to Supine: Contact Guard/Touching assist;Minimal Assistance - Patient > 75% Transfers Transfers: Sit to Stand;Stand to Sit;Stand  Pivot Transfers Sit to Stand: Contact Guard/Touching assist;Minimal Assistance - Patient > 75% Stand to Sit: Minimal Assistance - Patient > 75%;Contact Guard/Touching assist Stand Pivot Transfers: Minimal Assistance - Patient > 75% Stand Pivot Transfer Details: Verbal cues for technique;Verbal cues for precautions/safety;Verbal cues for safe use of DME/AE Transfer (Assistive device): Rolling walker Locomotion  Gait Ambulation: Yes Gait Assistance: Contact Guard/Touching assist;Minimal Assistance - Patient > 75% Gait Distance (Feet): 90 Feet Assistive device: Rolling walker Gait Assistance Details: Verbal cues for technique;Verbal cues for precautions/safety;Verbal cues for gait pattern;Verbal cues for safe use of DME/AE Gait Gait: Yes Gait Pattern: Left genu recurvatum;Step-through pattern;Decreased step length - left;Decreased stance time - right Gait velocity: decreased Stairs / Additional Locomotion Stairs: Yes Stairs Assistance: Minimal Assistance - Patient > 75%;Contact Guard/Touching assist Stair Management Technique: Two rails Number of Stairs: 4 Height of Stairs: 6  PT Evaluation completed; see above for results. PT educated patient in roles of PT vs OT, PT POC,  rehab potential, rehab goals, and discharge recommendations along with recommendation for follow-up rehabilitation services. Individual treatment initiated:  Patient supine in bed upon PT arrival. Patient alert and agreeable to PT session. No pain complaint during session.  Therapeutic Activity: Bed Mobility: Patient performed supine to/from sit with CGA/ light MinA to reach seated position on EOB. Returnt o supine with supervision Provided verbal cues for improved technique. Pt relates that she does have a mobile bed at home that allows for adjustment at head and foot of bed.  Transfers: Patient performed sit <> stand throughout session with MinA for balance. Provided vc/tc for improved technique in forward weight shift and BLE muscle activation. Tends to extend L knee into seated surface with hyperextension.  Car transfer performed with CGA/ MinA using RW.   Gait Training:  Patient ambulated 90 feet using RW with CGA/ light MinA initially for block to L knee. Ambulated with increased hyperextension during stance phase to LLE. With conscious practice and verbal cueing, pt is able to intermittently hold knee still to prevent about 25% of overall steps.  Patient supine  in bed at end of session with brakes locked, bed alarm set, and all needs within reach. Pt with questions re: performance and expected LOS. Pt's questions answered to best of this therapist's abilities and related that following OT/ ST evaluations, therapists will confer and decide on requested LOS in order to get pt back to optimal level of safe mobility.  Discharge Criteria: Patient will be discharged from PT if patient refuses treatment 3 consecutive times without medical reason, if treatment goals not met, if there is a change in medical status, if patient makes no progress towards goals or if patient is discharged from hospital.  The above assessment, treatment plan, treatment alternatives and goals were discussed and mutually  agreed upon: by patient  Alger Simons PT, DPT 02/11/2021, 7:25 PM

## 2021-02-11 NOTE — Plan of Care (Signed)
°  Problem: RH Problem Solving Goal: LTG Patient will demonstrate problem solving for (SLP) Description: LTG:  Patient will demonstrate problem solving for basic/complex daily situations with cues  (SLP) Flowsheets (Taken 02/11/2021 1447) LTG: Patient will demonstrate problem solving for (SLP): Complex daily situations LTG Patient will demonstrate problem solving for: Modified Independent   Problem: RH Attention Goal: LTG Patient will demonstrate this level of attention during functional activites (SLP) Description: LTG:  Patient will will demonstrate this level of attention during functional activites (SLP) Flowsheets (Taken 02/11/2021 1447) Patient will demonstrate during cognitive/linguistic activities the attention type of: Alternating Patient will demonstrate this level of attention during cognitive/linguistic activities in: Home LTG: Patient will demonstrate this level of attention during cognitive/linguistic activities with assistance of (SLP): Modified Independent

## 2021-02-11 NOTE — IPOC Note (Addendum)
Overall Plan of Care [facility]) Patient Details Name: Lindsay Rice MRN: [id number] DOB: 05/14/1950  Admitting Diagnosis: Infarction of right basal ganglia [facility])  Hospital Problems: Principal Problem:   Infarction of right basal ganglia ([facility])     Functional Problem List: Nursing Bladder, Bowel, Endurance, Medication Management, Safety  PT Balance, Behavior, Endurance, Motor, Perception, Safety, Sensory  OT Balance, Endurance, Motor, Safety, Sensory, Skin Integrity  SLP Cognition, Safety  TR         Basic ADLs: OT Eating, Bathing, Grooming, Dressing, Toileting     Advanced  ADLs: OT       Transfers: PT Bed Mobility, Bed to Chair, Car, Occupational psychologist, Research scientist (life sciences): PT Ambulation, Stairs     Additional Impairments: OT Fuctional Use of Upper Extremity  SLP Social Cognition   Problem Solving, Attention  TR      Anticipated Outcomes Item Anticipated Outcome  Self Feeding Mod I  Swallowing      Basic self-care  Mod I  Toileting  Mod I   Bathroom Transfers Supervision/mod I  Bowel/Bladder  Supervision  Transfers  supervision  Locomotion  supervision/ CGA  Communication     Cognition  Mod I  Pain  n/a  Safety/Judgment  supervision and no falls   Therapy Plan: PT Intensity: Minimum of 1-2 x/day ,45 to 90 minutes PT Frequency: 5 out of 7 days PT Duration Estimated Length of Stay: 10-14 days OT Intensity: Minimum of 1-2 x/day, 45 to 90 minutes OT Frequency: 5 out of 7 days OT Duration/Estimated Length of Stay: 12-14 days SLP Intensity: Minumum of 1-2 x/day, 30 to 90 minutes SLP Frequency: 1 to 3 out of 7 days SLP Duration/Estimated Length of Stay: 12-14 days   Due to the current state of emergency, patients may not be receiving their 3-hours of Medicare-mandated therapy.   Team Interventions: Nursing Interventions Patient/Family Education, Bladder Management, Bowel Management, Disease Management/Prevention, Medication Management,  Discharge Planning  PT interventions Ambulation/gait training, Community reintegration, DME/adaptive equipment instruction, Neuromuscular re-education, Psychosocial support, Stair training, UE/LE Strength taining/ROM, Wheelchair propulsion/positioning, UE/LE Coordination activities, Therapeutic Activities, Skin care/wound management, Pain management, Functional electrical stimulation, Discharge planning, Balance/vestibular training, Cognitive remediation/compensation, Disease management/prevention, Functional mobility training, Patient/family education, Splinting/orthotics, Therapeutic Exercise, Visual/perceptual remediation/compensation  OT Interventions Warden/ranger, Community reintegration, Discharge planning, Disease mangement/prevention, DME/adaptive equipment instruction, Functional electrical stimulation, Functional mobility training, Neuromuscular re-education, Pain management, Patient/family education, Psychosocial support, Self Care/advanced ADL retraining, Skin care/wound managment, Splinting/orthotics, Therapeutic Activities, Therapeutic Exercise, UE/LE Strength taining/ROM, UE/LE Coordination activities, Visual/perceptual remediation/compensation, Wheelchair propulsion/positioning  SLP Interventions Cognitive remediation/compensation, Therapeutic Exercise, Therapeutic Activities, Patient/family education, Functional tasks, Cueing hierarchy  TR Interventions    SW/CM Interventions     Barriers to Discharge MD  Medical stability  Nursing Decreased caregiver support, Home environment access/layout, Incontinence, Neurogenic Bowel & Bladder, Lack of/limited family support, Weight, Medication compliance Lives in 2 level home with 3 steps to enter, no rails. Full flight of stairs to 2nd level. Able to live on main level with bedroom/bathroom. Lives with spouse. Children and grandchildren will be assisting at discharge.  PT Inaccessible home environment, Decreased caregiver support,  Home environment access/layout, Incontinence, Insurance for SNF coverage    OT      SLP      SW       Team Discharge Planning: Destination: PT-Home ,OT- Home , SLP-Home Projected Follow-up: PT-Home health PT, Outpatient PT, OT-   , SLP-None (close to baseline at eval) Projected Equipment Needs:  PT-To be determined, OT- To be determined, SLP-None recommended by SLP Equipment Details: PT- , OT-  Patient/family involved in discharge planning: PT- Patient,  OT-Patient, SLP-Patient  MD ELOS: 12-14 days Medical Rehab Prognosis:  Excellent Assessment: The patient has been admitted for CIR therapies with the diagnosis of right bg infarct. The team will be addressing functional mobility, strength, stamina, balance, safety, adaptive techniques and equipment, self-care, bowel and bladder mgt, patient and caregiver education, NMR, cognition, community reentr. Goals have been set at mod I with mobility, self-care and cognition.   Due to the current state of emergency, patients may not be receiving their 3 hours per day of Medicare-mandated therapy.    Ranelle Oyster, MD, FAAPMR     See Team Conference Notes for weekly updates to the plan of care

## 2021-02-11 NOTE — Plan of Care (Signed)
°  Problem: RH Balance Goal: LTG: Patient will maintain dynamic sitting balance (OT) Description: LTG:  Patient will maintain dynamic sitting balance with assistance during activities of daily living (OT) Flowsheets (Taken 02/11/2021 1245) LTG: Pt will maintain dynamic sitting balance during ADLs with: Independent Goal: LTG Patient will maintain dynamic standing with ADLs (OT) Description: LTG:  Patient will maintain dynamic standing balance with assist during activities of daily living (OT)  Flowsheets (Taken 02/11/2021 1245) LTG: Pt will maintain dynamic standing balance during ADLs with: Independent with assistive device   Problem: Sit to Stand Goal: LTG:  Patient will perform sit to stand in prep for activites of daily living with assistance level (OT) Description: LTG:  Patient will perform sit to stand in prep for activites of daily living with assistance level (OT) Flowsheets (Taken 02/11/2021 1245) LTG: PT will perform sit to stand in prep for activites of daily living with assistance level: Independent with assistive device   Problem: RH Eating Goal: LTG Patient will perform eating w/assist, cues/equip (OT) Description: LTG: Patient will perform eating with assist, with/without cues using equipment (OT) Flowsheets (Taken 02/11/2021 1245) LTG: Pt will perform eating with assistance level of: Independent with assistive device    Problem: RH Grooming Goal: LTG Patient will perform grooming w/assist,cues/equip (OT) Description: LTG: Patient will perform grooming with assist, with/without cues using equipment (OT) Flowsheets (Taken 02/11/2021 1245) LTG: Pt will perform grooming with assistance level of: Independent with assistive device    Problem: RH Bathing Goal: LTG Patient will bathe all body parts with assist levels (OT) Description: LTG: Patient will bathe all body parts with assist levels (OT) Flowsheets (Taken 02/11/2021 1245) LTG: Pt will perform bathing with assistance  level/cueing: Supervision/Verbal cueing   Problem: RH Dressing Goal: LTG Patient will perform upper body dressing (OT) Description: LTG Patient will perform upper body dressing with assist, with/without cues (OT). Flowsheets (Taken 02/11/2021 1245) LTG: Pt will perform upper body dressing with assistance level of: Independent Goal: LTG Patient will perform lower body dressing w/assist (OT) Description: LTG: Patient will perform lower body dressing with assist, with/without cues in positioning using equipment (OT) Flowsheets (Taken 02/11/2021 1245) LTG: Pt will perform lower body dressing with assistance level of: Independent with assistive device   Problem: RH Toileting Goal: LTG Patient will perform toileting task (3/3 steps) with assistance level (OT) Description: LTG: Patient will perform toileting task (3/3 steps) with assistance level (OT)  Flowsheets (Taken 02/11/2021 1245) LTG: Pt will perform toileting task (3/3 steps) with assistance level: Independent with assistive device   Problem: RH Functional Use of Upper Extremity Goal: LTG Patient will use RT/LT upper extremity as a (OT) Description: LTG: Patient will use right/left upper extremity as a stabilizer/gross assist/diminished/nondominant/dominant level with assist, with/without cues during functional activity (OT) Flowsheets (Taken 02/11/2021 1245) LTG: Use of upper extremity in functional activities: LUE as diminished level   Problem: RH Toilet Transfers Goal: LTG Patient will perform toilet transfers w/assist (OT) Description: LTG: Patient will perform toilet transfers with assist, with/without cues using equipment (OT) Flowsheets (Taken 02/11/2021 1245) LTG: Pt will perform toilet transfers with assistance level of: Independent with assistive device   Problem: RH Tub/Shower Transfers Goal: LTG Patient will perform tub/shower transfers w/assist (OT) Description: LTG: Patient will perform tub/shower transfers with assist,  with/without cues using equipment (OT) Flowsheets (Taken 02/11/2021 1245) LTG: Pt will perform tub/shower stall transfers with assistance level of: Supervision/Verbal cueing

## 2021-02-11 NOTE — Evaluation (Addendum)
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 No pertinent surgical history.  Assessment & Plan Clinical Impression: Patient is a 70 y.o. year old female with recent admission to the hospital on 12/20 with acute onset L sided weakness. MRI with 19m acute ischemic nonhemorrhagic infarct of posterior limb of R internal capsule.  Patient transferred to CIR on 02/10/2021 .    Patient currently requires min with basic self-care skills secondary to muscle weakness, decreased cardiorespiratoy endurance, impaired timing and sequencing, abnormal tone, unbalanced muscle activation, ataxia, and decreased coordination, and decreased sitting balance, decreased standing balance, decreased postural control, hemiplegia, and decreased balance strategies.  Prior to hospitalization, patient could complete BADL with independent .  Patient will benefit from skilled intervention to increase independence with basic self-care skills prior to discharge home with care partner.  Anticipate patient will require intermittent supervision and follow up outpatient.  OT - End of Session Endurance Deficit: Yes Endurance Deficit Description: rest breaks within BADL tasks OT Assessment Rehab Potential (ACUTE ONLY):  Excellent OT Patient demonstrates impairments in the following area(s): Balance;Endurance;Motor;Safety;Sensory;Skin Integrity OT Basic ADL's Functional Problem(s): Eating;Bathing;Grooming;Dressing;Toileting OT Transfers Functional Problem(s): Toilet;Tub/Shower OT Additional Impairment(s): Fuctional Use of Upper Extremity OT Plan OT Intensity: Minimum of 1-2 x/day, 45 to 90 minutes OT Frequency: 5 out of 7 days OT Duration/Estimated Length of Stay: 12-14 days OT Treatment/Interventions: BMedical illustratortraining;Community reintegration;Discharge planning;Disease mangement/prevention;DME/adaptive equipment instruction;Functional electrical stimulation;Functional mobility training;Neuromuscular re-education;Pain management;Patient/family education;Psychosocial support;Self Care/advanced ADL retraining;Skin care/wound managment;Splinting/orthotics;Therapeutic Activities;Therapeutic Exercise;UE/LE Strength taining/ROM;UE/LE Coordination activities;Visual/perceptual remediation/compensation;Wheelchair propulsion/positioning OT Self Feeding Anticipated Outcome(s): Mod I OT Basic Self-Care Anticipated Outcome(s): Mod I OT Toileting Anticipated Outcome(s): Mod I OT Bathroom Transfers Anticipated Outcome(s): Supervision/mod I OT Recommendation Recommendations for Other Services: Therapeutic Recreation consult Therapeutic Recreation Interventions: Pet therapy;Kitchen group;Stress management;Outing/community reintergration Patient destination: Home Equipment Recommended: To be determined   OT Evaluation Precautions/Restrictions  Precautions Precautions: Fall Precaution Comments: L hemiplegia Restrictions Weight Bearing Restrictions: No Vital Signs Oxygen Therapy SpO2: 97 % O2 Device: Room Air Pain Pain Assessment Pain Score: 1  Home Living/Prior Functioning Home Living Family/patient expects to be discharged to:: Private residence Living Arrangements: Spouse/significant other Available  Help at Discharge: Family, Available 24 hours/day Type of Home: House Home Access: Stairs to enter ETechnical brewerof Steps: 3 Home Layout: Two level, Laundry or work area in basement, Able to live on main level with bedroom/bathroom Alternate Level Stairs-Number of Steps: flight Bathroom Shower/Tub: WMultimedia programmer Handicapped height Bathroom Accessibility: Yes Additional Comments: Has a seat in the shower  Lives With: Spouse IADL History IADL Comments: Enjoys cBarrister's clerk spending time with grandchildren Prior Function Level of Independence: Independent with homemaking with ambulation, Independent with basic ADLs 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, 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, water qualityprior to knee surgery in march  of 2022 Vision Baseline Vision/History: 1 Wears glasses Ability to See in Adequate Light: 0 Adequate Patient Visual Report: No change from baseline Vision Assessment?: No apparent visual deficits Perception  Perception: Within Functional Limits Cognition Overall Cognitive Status: Within Functional Limits for tasks assessed Arousal/Alertness: Awake/alert Orientation Level: Person;Place;Situation Person: Oriented Place: Oriented Situation: Oriented Year: 2022 Month: December Day of Week: Correct Memory: Appears intact Immediate Memory Recall: Sock;Blue;Bed Memory Recall Sock: Without Cue Memory Recall Blue: Without Cue Memory Recall Bed: Without Cue Attention: Sustained Sustained Attention: Impaired Sustained Attention Impairment: Verbal complex;Functional complex Awareness: Appears intact Problem Solving: Appears intact Behaviors: Impulsive Safety/Judgment: Impaired Comments: slightly impulsive Sensation Sensation Light Touch: Impaired Detail Light Touch Impaired Details: Impaired LLE;Impaired LUE Coordination Gross Motor Movements are Fluid and Coordinated: No Fine Motor Movements are Fluid and Coordinated:  No Coordination and Movement Description: decreased smoothness and accuracy with L hand Finger Nose Finger Test: L dysmetria Motor  Motor Motor: Hemiplegia;Ataxia Motor - Skilled Clinical Observations: L hemi  Balance Balance Balance Assessed: Yes Static Sitting Balance Static Sitting - Balance Support: Feet supported Static Sitting - Level of Assistance: 5: Stand by assistance Dynamic Sitting Balance Dynamic Sitting - Balance Support: Feet supported;During functional activity Dynamic Sitting - Level of Assistance: 5: Stand by assistance Static Standing Balance Static Standing - Balance Support: During functional activity Static Standing - Level of Assistance: 4: Min assist Dynamic Standing Balance Dynamic Standing - Balance Support: During functional activity Dynamic Standing - Level of Assistance: 4: Min assist Extremity/Trunk Assessment RUE Assessment RUE Assessment: Within Functional Limits LUE Assessment LUE Assessment: Exceptions to [facility] General Strength Comments: 3-/5 overall LUE Body System: Neuro Brunstrum levels for arm and hand: Arm;Hand Brunstrum level for arm: Stage IV Movement is deviating from synergy Brunstrum level for hand: Stage IV Movements deviating from synergies  Care Tool Care Tool Self Care Eating   Eating Assist Level: Supervision/Verbal cueing    Oral Care    Oral Care Assist Level: Minimal Assistance - Patient > 75%    Bathing   Body parts bathed by patient: Right arm;Left arm;Chest;Abdomen;Front perineal area;Buttocks;Right upper leg;Left upper leg;Right lower leg;Left lower leg;Face     Assist Level: Minimal Assistance - Patient > 75%    Upper Body Dressing(including orthotics)   What is the patient wearing?: Bra;Pull over shirt   Assist Level: Minimal Assistance - Patient > 75%    Lower Body Dressing (excluding footwear)   What is the patient wearing?: Hospital gown only;Pants Assist for lower body dressing: Minimal Assistance -  Patient > 75%    Putting on/Taking off footwear   What is the patient wearing?: Socks;Shoes Assist for footwear: Minimal Assistance - Patient > 75%       Care Tool Toileting Toileting activity   Assist for toileting: Minimal Assistance - Patient > 75%     Care Tool Bed Mobility Roll left and right activity   Roll left and right assist level: Contact Guard/Touching assist    Sit to lying activity   Sit to lying assist level: Minimal Assistance - Patient > 75%    Lying to sitting on side of bed activity   Lying to sitting on side of bed assist level: the ability to move from lying on the back to sitting on the side of the bed with no back support.: Minimal Assistance - Patient > 75%     Care Tool Transfers Sit to stand transfer   Sit to stand assist level: Minimal Assistance - Patient >  75%    Chair/bed transfer   Chair/bed transfer assist level: Minimal Assistance - Patient > 75%     Toilet transfer   Assist Level: Minimal Assistance - Patient > 75%     Care Tool Cognition  Expression of Ideas and Wants Expression of Ideas and Wants: 4. Without difficulty (complex and basic) - expresses complex messages without difficulty and with speech that is clear and easy to understand  Understanding Verbal and Non-Verbal Content Understanding Verbal and Non-Verbal Content: 4. Understands (complex and basic) - clear comprehension without cues or repetitions   Memory/Recall Ability Memory/Recall Ability : Current season;Staff names and faces;Location of own room;That he or she is in a hospital/hospital unit   Refer to Care Plan for [place] 1 OT Short Term Goal 1 (Week 1): Patient will complete toilet transfer with CGA OT Short Term Goal 2 (Week 1): Patient withh use L hand to brush hair for 50% of activity OT Short Term Goal 3 (Week 1): Patient will maintain standing balance at the sink with CGA in preparationf or BADL tasks OT Short Term Goal 4 (Week  1): Patient will recall hemi dressing techniuqes without cues  Recommendations for other services: Therapeutic Recreation  Pet therapy, Kitchen group, Stress management, and Outing/community reintegration   Skilled Therapeutic Intervention OT eval completed addressing rehab process, OT purpose, POC, ELOS, and goals.  Pt ambulated with RW and min A for bathroom transfers, overall Min A for BADL tasks. Patient's L UE is demonstrating good return at a 3/5 overall for strength. She can activate synergy intentionally and is able to use it at a diminshed level at this time. Pt slightly impulsive and needs minimal cues for safety awareness. Pt with intermittent L lateral LOB when ambulating and standing with L knee hyperextension. Pt able to grasp  hairdrying with L hand abd bring towards hair, but unable to maintain position. Pt is very eager to return home. Pt left seated in recliner with chair alarm on, call bell in reach, and needs met. See below for further details regarding BADL performance.    ADL ADL Eating: Set up Grooming: Minimal assistance Upper Body Bathing: Minimal assistance Lower Body Bathing: Minimal assistance Upper Body Dressing: Minimal assistance Lower Body Dressing: Minimal assistance Toileting: Minimal assistance Toilet Transfer: Minimal assistance Tub/Shower Transfer: Minimal assistance Mobility  Transfers Sit to Stand: Contact Guard/Touching assist Stand to Sit: Contact Guard/Touching assist   Discharge Criteria: Patient will be discharged from OT if patient refuses treatment 3 consecutive times without medical reason, if treatment goals not met, if there is a change in medical status, if patient makes no progress towards goals or if patient is discharged from hospital.  The above assessment, treatment plan, treatment alternatives and goals were discussed and mutually agreed upon: by patient  Valma Cava 02/11/2021, 12:52 PM

## 2021-02-12 LAB — GLUCOSE, CAPILLARY
Glucose-Capillary: 216 mg/dL — ABNORMAL HIGH (ref 70–99)
Glucose-Capillary: 128 mg/dL — ABNORMAL HIGH (ref 70–99)
Glucose-Capillary: 113 mg/dL — ABNORMAL HIGH (ref 70–99)
Glucose-Capillary: 157 mg/dL — ABNORMAL HIGH (ref 70–99)

## 2021-02-12 MED ORDER — GLIMEPIRIDE 2 MG PO TABS
1.0000 mg | ORAL_TABLET | Freq: Every day | ORAL | Status: DC
Start: 2021-02-12 — End: 2021-02-14
  Administered 2021-02-12 – 2021-02-14 (×3): 1 mg via ORAL
  Filled 2021-02-12 (×3): qty 1

## 2021-02-13 LAB — COMPREHENSIVE METABOLIC PANEL
ALT: 21 U/L (ref 0–44)
AST: 30 U/L (ref 15–41)
Albumin: 3.4 g/dL — ABNORMAL LOW (ref 3.5–5.0)
Alkaline Phosphatase: 63 U/L (ref 38–126)
Anion gap: 12 (ref 5–15)
BUN: 20 mg/dL (ref 8–23)
CO2: 24 mmol/L (ref 22–32)
Calcium: 8.9 mg/dL (ref 8.9–10.3)
Chloride: 100 mmol/L (ref 98–111)
Creatinine, Ser: 1.26 mg/dL — ABNORMAL HIGH (ref 0.44–1.00)
GFR, Estimated: 46 mL/min — ABNORMAL LOW (ref >60)
Glucose, Bld: 193 mg/dL — ABNORMAL HIGH (ref 70–99)
Potassium: 4.1 mmol/L (ref 3.5–5.1)
Sodium: 136 mmol/L (ref 135–145)
Total Bilirubin: 0.5 mg/dL (ref 0.3–1.2)
Total Protein: 7.3 g/dL (ref 6.5–8.1)

## 2021-02-13 LAB — GLUCOSE, CAPILLARY
Glucose-Capillary: 143 mg/dL — ABNORMAL HIGH (ref 70–99)
Glucose-Capillary: 191 mg/dL — ABNORMAL HIGH (ref 70–99)
Glucose-Capillary: 158 mg/dL — ABNORMAL HIGH (ref 70–99)
Glucose-Capillary: 164 mg/dL — ABNORMAL HIGH (ref 70–99)

## 2021-02-13 LAB — CBC WITH DIFFERENTIAL/PLATELET
Abs Immature Granulocytes: 0.08 10*3/uL — ABNORMAL HIGH (ref 0.00–0.07)
Basophils Absolute: 0 10*3/uL (ref 0.0–0.1)
Basophils Relative: 0 %
Eosinophils Absolute: 0.4 10*3/uL (ref 0.0–0.5)
Eosinophils Relative: 4 %
HCT: 36.3 % (ref 36.0–46.0)
Hemoglobin: 11.9 g/dL — ABNORMAL LOW (ref 12.0–15.0)
Immature Granulocytes: 1 %
Lymphocytes Relative: 16 %
Lymphs Abs: 1.7 10*3/uL (ref 0.7–4.0)
MCH: 27.5 pg (ref 26.0–34.0)
MCHC: 32.8 g/dL (ref 30.0–36.0)
MCV: 84 fL (ref 80.0–100.0)
Monocytes Absolute: 0.4 10*3/uL (ref 0.1–1.0)
Monocytes Relative: 4 %
Neutro Abs: 7.7 10*3/uL (ref 1.7–7.7)
Neutrophils Relative %: 75 %
Platelets: 271 10*3/uL (ref 150–400)
RBC: 4.32 MIL/uL (ref 3.87–5.11)
RDW: 13.7 % (ref 11.5–15.5)
WBC: 10.3 10*3/uL (ref 4.0–10.5)
nRBC: 0 % (ref 0.0–0.2)

## 2021-02-13 NOTE — Progress Notes (Signed)
Inpatient Rehabilitation  Patient information reviewed and entered into eRehab system by Aveer Bartow M. Kayde Warehime, M.A., CCC/SLP, PPS Coordinator.  Information including medical coding, functional ability and quality indicators will be reviewed and updated through discharge.    

## 2021-02-13 NOTE — Plan of Care (Signed)
°  Problem: RH Balance Goal: LTG Patient will maintain dynamic standing balance (PT) Description: LTG:  Patient will maintain dynamic standing balance with assistance during mobility activities (PT) Flowsheets (Taken 02/11/2021 1652) LTG: Pt will maintain dynamic standing balance during mobility activities with:: Independent   Problem: Sit to Stand Goal: LTG:  Patient will perform sit to stand with assistance level (PT) Description: LTG:  Patient will perform sit to stand with assistance level (PT) Flowsheets (Taken 02/11/2021 1652) LTG: PT will perform sit to stand in preparation for functional mobility with assistance level: Supervision/Verbal cueing   Problem: RH Bed Mobility Goal: LTG Patient will perform bed mobility with assist (PT) Description: LTG: Patient will perform bed mobility with assistance, with/without cues (PT). Flowsheets (Taken 02/11/2021 1652) LTG: Pt will perform bed mobility with assistance level of: Independent with assistive device    Problem: RH Bed to Chair Transfers Goal: LTG Patient will perform bed/chair transfers w/assist (PT) Description: LTG: Patient will perform bed to chair transfers with assistance (PT). Flowsheets (Taken 02/11/2021 1652) LTG: Pt will perform Bed to Chair Transfers with assistance level: Supervision/Verbal cueing   Problem: RH Car Transfers Goal: LTG Patient will perform car transfers with assist (PT) Description: LTG: Patient will perform car transfers with assistance (PT). Flowsheets (Taken 02/11/2021 1652) LTG: Pt will perform car transfers with assist:: Supervision/Verbal cueing   Problem: RH Furniture Transfers Goal: LTG Patient will perform furniture transfers w/assist (OT/PT) Description: LTG: Patient will perform furniture transfers  with assistance (OT/PT). Flowsheets (Taken 02/11/2021 1652) LTG: Pt will perform furniture transfers with assist:: Supervision/Verbal cueing   Problem: RH Ambulation Goal: LTG Patient will  ambulate in controlled environment (PT) Description: LTG: Patient will ambulate in a controlled environment, # of feet with assistance (PT). Flowsheets (Taken 02/11/2021 1652) LTG: Pt will ambulate in controlled environ  assist needed:: Supervision/Verbal cueing LTG: Ambulation distance in controlled environment: at least 200 ft using LRAD Goal: LTG Patient will ambulate in home environment (PT) Description: LTG: Patient will ambulate in home environment, # of feet with assistance (PT). Flowsheets (Taken 02/11/2021 1652) LTG: Pt will ambulate in home environ  assist needed:: Supervision/Verbal cueing LTG: Ambulation distance in home environment: at least 50 ft using LRAD   Problem: RH Wheelchair Mobility Goal: LTG Patient will propel w/c in controlled environment (PT) Description: LTG: Patient will propel wheelchair in controlled environment, # of feet with assist (PT) Flowsheets (Taken 02/11/2021 1652) LTG: Pt will propel w/c in controlled environ  assist needed:: Supervision/Verbal cueing LTG: Propel w/c distance in controlled environment: 150 ft   Problem: RH Stairs Goal: LTG Patient will ambulate up and down stairs w/assist (PT) Description: LTG: Patient will ambulate up and down # of stairs with assistance (PT) Flowsheets (Taken 02/11/2021 1652) LTG: Pt will ambulate up/down stairs assist needed:: Supervision/Verbal cueing LTG: Pt will  ambulate up and down number of stairs: at least 3 steps using HR setup as per home environment

## 2021-02-13 NOTE — Progress Notes (Signed)
Occupational Therapy Session Note  Patient Details  Name: Lindsay Rice MRN: [id number] Date of Birth: August 27, 1950  Today's Date: 02/13/2021 OT Individual Time: 6144-3154 OT Individual Time Calculation (min): 45 min  and Today's Date: 02/13/2021 OT Missed Time: 15 Minutes Missed Time Reason: Other (comment) (previous pt care)   Short Term Goals: Week 1:  OT Short Term Goal 1 (Week 1): Patient will complete toilet transfer with CGA OT Short Term Goal 2 (Week 1): Patient withh use L hand to brush hair for 50% of activity OT Short Term Goal 3 (Week 1): Patient will maintain standing balance at the sink with CGA in preparationf or BADL tasks OT Short Term Goal 4 (Week 1): Patient will recall hemi dressing techniuqes without cues   Skilled Therapeutic Interventions/Progress Updates:    Pt greeted at time of session 10 minutes late 2/2 previous pt care. Pt bed level and agreeable to OT session and no pain reported. Bed mobility with Supervision, noted to have L lean seated on EOB during dynamic tasks. Donned shoes sitting EOB with Min/Mod for assist with laces and in figure four to don. Pt ambulating bed > sink <> bathroom and standing for oral hygiene/face washing with CGA prior to walking to bathroom and performing 3/3 toileting tasks in same manner. Cues throughout for L hand placement on RW and for integrating into tasks. Pt walking short distance in hall with CGA and cues to prevent L knee hyperextension with good carryover noted. In gym, focused on L hand pinch strength for tripod pinch for clips yellow - green with 2 seated rest breaks and extended time required. Walking back to room CGA, set up in recliner alarm on call bell in reach.   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, impulsive Restrictions Weight Bearing Restrictions: No     Therapy/Group: Individual Therapy  Erasmo Score 02/13/2021, 7:27 AM

## 2021-02-13 NOTE — Progress Notes (Signed)
Speech Language Pathology Daily Session Note  Patient Details  Name: Lindsay Rice MRN: [id number] Date of Birth: [birth year]  Today's Date: 02/13/2021 SLP Individual Time: 1117-1200 SLP Individual Time Calculation (min): 43 min  Short Term Goals: Week 1: SLP Short Term Goal 1 (Week 1): Patient will complete complex problem solving tasks with sup A verbal cues SLP Short Term Goal 2 (Week 1): Patient will complete simulated medication/financial management tasks with sup A verbal cues to achieve 90% accuracy SLP Short Term Goal 3 (Week 1): Pt will alternate attention between 2 functional tasks provided Supervision A verbal cues  Skilled Therapeutic Interventions: Pt seen for skilled ST with focus on cognitive goals, pt showing SLP strengthening exercises she has been completing independently in room. Pt participating in portions of ALFA all with 100% accuracy Mod I including check writing/checkbook balancing, solving daily math problems and understanding medicine labels. Pt demonstrates awareness of current status, reports she has had some falls at home due to "having trouble understanding I need to slow down". Pt states she has her cell phone on her at all times for safety, husband unable to provide physical assist. Will focus next session on medication management/organizing. Cont ST POC.   Pain Pain Assessment Pain Scale: 0-10 Pain Score: 0-No pain  Therapy/Group: Individual Therapy  Tacey Ruiz 02/13/2021, 12:00 PM

## 2021-02-13 NOTE — Progress Notes (Signed)
PROGRESS NOTE   Subjective/Complaints:  Pt reports no pain- LBM this AM.  Good right now- is peeing a lot.  Ate 90%+ of breakfast tray.   Is drinking well, per pt. Pt on thin liquids/regular consistency diet.   ROS:  Pt denies SOB, abd pain, CP, N/V/C/D, and vision changes    Objective:   No results found. Recent Labs    02/13/21 0735  WBC 10.3  HGB 11.9*  HCT 36.3  PLT 271   Recent Labs    02/13/21 0735  NA 136  K 4.1  CL 100  CO2 24  GLUCOSE 193*  BUN 20  CREATININE 1.26*  CALCIUM 8.9    Intake/Output Summary (Last 24 hours) at 02/13/2021 0905 Last data filed at 02/13/2021 0745 Gross per 24 hour  Intake 576 ml  Output --  Net 576 ml        Physical Exam: Vital Signs Blood pressure (!) 145/82, pulse 100, temperature 98.6 F (37 C), temperature source Oral, resp. rate 16, height 5\' 5"  (1.651 m), SpO2 94 %.   General: awake, alert, appropriate, sitting up in bed; finished breakfast; NAD HENT: conjugate gaze; oropharynx moist CV: borderline tachycardic rate; no JVD Pulmonary: CTA B/L; no W/R/R- good air movement GI: soft, NT, ND, (+)BS; hypoactive Psychiatric: appropriate; interactive Neurological: Alert-  Extremities: No clubbing, cyanosis, or edema. Pulses are 2+ Psych: Pt's affect is appropriate. Pt is cooperative Skin: Clean and intact without signs of breakdown Neuro:  Alert and oriented x 3. Normal insight and awareness. Intact Memory. Normal language and speech. Cranial nerve exam unremarkable except for left central 7 and left perioral numbness. RUE and RLE 5/5. LUE 3-+ to 3/5 prox to distal. LLE 4- prox to 4/5 distally. Decreased sensory loss along left hand. Musculoskeletal: Full ROM, No pain with AROM or PROM in the neck, trunk, or extremities. Posture appropriate     Assessment/Plan: 1. Functional deficits which require 3+ hours per day of interdisciplinary therapy in a  comprehensive inpatient rehab setting. Physiatrist is providing close team supervision and 24 hour management of active medical problems listed below. Physiatrist and rehab team continue to assess barriers to discharge/monitor patient progress toward functional and medical goals  Care Tool:  Bathing    Body parts bathed by patient: Right arm, Left arm, Chest, Abdomen, Front perineal area, Buttocks, Right upper leg, Left upper leg, Right lower leg, Left lower leg, Face         Bathing assist Assist Level: Minimal Assistance - Patient > 75%     Upper Body Dressing/Undressing Upper body dressing   What is the patient wearing?: Bra, Pull over shirt    Upper body assist Assist Level: Minimal Assistance - Patient > 75%    Lower Body Dressing/Undressing Lower body dressing      What is the patient wearing?: Hospital gown only, Pants     Lower body assist Assist for lower body dressing: Minimal Assistance - Patient > 75%     Toileting Toileting    Toileting assist Assist for toileting: Minimal Assistance - Patient > 75%     Transfers Chair/bed transfer  Transfers assist     Chair/bed transfer  assist level: Minimal Assistance - Patient > 75%     Locomotion Ambulation   Ambulation assist      Assist level: Minimal Assistance - Patient > 75% Assistive device: Walker-rolling     Walk 10 feet activity   Assist     Assist level: Minimal Assistance - Patient > 75% Assistive device: Walker-rolling   Walk 50 feet activity   Assist    Assist level: Minimal Assistance - Patient > 75% Assistive device: Walker-rolling    Walk 150 feet activity   Assist Walk 150 feet activity did not occur: Safety/medical concerns         Walk 10 feet on uneven surface  activity   Assist Walk 10 feet on uneven surfaces activity did not occur: Safety/medical concerns         Wheelchair     Assist Is the patient using a wheelchair?: Yes Type of Wheelchair:  Manual    Wheelchair assist level: Total Assistance - Patient < 25% Max wheelchair distance: 300 ft    Wheelchair 50 feet with 2 turns activity    Assist        Assist Level: Total Assistance - Patient < 25%   Wheelchair 150 feet activity     Assist      Assist Level: Total Assistance - Patient < 25%   Blood pressure (!) 145/82, pulse 100, temperature 98.6 F (37 C), temperature source Oral, resp. rate 16, height 5\' 5"  (1.651 m), SpO2 94 %.  Medical Problem List and Plan: 1.  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             -patient may shower             -ELOS/Goals: 7-10 days, mod I to supervision goals  Continue CIR- PT, OT - team conference tomorrow to determine length of stay.   2.  Antithrombotics: -DVT/anticoagulation:  Pharmaceutical: Lovenox             -antiplatelet therapy: Aspirin 81 mg daily and Plavix 75 mg daily x3 weeks then Plavix alone 3. Pain Management: Elavil 25 mg nightly, Neurontin 300 mg twice daily, Fioricet as needed. 4. Mood: BuSpar 5 mg twice daily, Zoloft 100 mg daily and 50 mg nightly             -moods seems up beat. Family appears supportive.   12/26- bright- con't regimen and monitor             -antipsychotic agents: N/A 5. Neuropsych: This patient is capable of making decisions on her own behalf. 6. Skin/Wound Care: Routine skin checks 7. Fluids/Electrolytes/Nutrition: Routine in and outs with follow-up chemistries 8.  Permissive hypertension.  Currently on Norvasc 5 mg daily.  Patient also on HCTZ 25 mg daily, Cozaar 100 mg every morning, Lopressor 100 mg every morning and 50 mg nightly prior to admission.   -in "range" at present. Continue to follow  12/26- BP slightly elevated at 145/70s- con't regimen since usually controlled- and monitor 9.  History of migraine headaches.  Depakote 500 mg  nightly,Ajovy 225 mg subcutaneous every 30 days 10.  OSA.  Continue inhalers as directed 11.  Hypothyroidism.  Synthroid 12.  Diabetes mellitus.  Hemoglobin A1c 8.3.  Jardiance 25 mg daily.  Check blood sugars before meals and at bedtime  CBG (last 3)  Recent Labs    02/12/21 1631 02/12/21 2141 02/13/21  3709  GLUCAP 113* 157* 143*   12/24 -monitor with jardiance on board. Seems to need better control    -consider amaryl trial  12/26- CBGs 113- 128 in last 24 hours- will con't regimen for now , but might add Amaryl.  13.  Hyperlipidemia.  Lipitor 14.  Obesity.  BMI 33.31.  Dietary follow-up  15. AKI  12/26- Cr up to 1.26 from 0.93- BUN 20- so just shy of being dry per guidelines- will encourage fluids and recheck Wednesday.    LOS: 3 days A FACE TO FACE EVALUATION WAS PERFORMED  Lindsay Rice 02/13/2021, 9:05 AM

## 2021-02-13 NOTE — Progress Notes (Signed)
Physical Therapy Session Note  Patient Details  Name: Lindsay Rice MRN: [id number] Date of Birth: [birth year]  Today's Date: 02/13/2021 PT Individual Time: 4403-4742, 5956-3875 PT Individual Time Calculation (min): 56 min, 21 min   Short Term Goals: Week 1:  PT Short Term Goal 1 (Week 1): Pt will ambulate with supervision assist for short distances PT Short Term Goal 2 (Week 1): Pt will initiate stair training PT Short Term Goal 3 (Week 1): Pt will demonstrate >5 min standing activity without L knee buckling  Skilled Therapeutic Interventions/Progress Updates:    Session 1: Pt recd in bathroom as hand off from NT. Pt ambulated out of bathroom with RW and CGA and performed hand hygiene at sink. In room ambulation to retrieve mask in same manner. Pt ambulated across hall with RW and CGA. Occ LOB while turning to sit and rounding tight corners. Pt demoes impulsive behaviors at times, and while knowing she needs to slow down, occ continues to move unsafely.   Gait training: Pt then ambulated 2 x 200 ft with seated rest break, RW and CGA. VC to improve L step length and stance time. Pt reported mild squeezing sensation in BIL calves which relieved with rest. TTP in 2 places on L calf.   NMR: Pt performed cone taps with RW for improved single leg stance stability. Difficulty with standing on L foot so transitioned to single leg stance with R foot on 3" step for forced use and improved proprioception to maintain controlled hip and knee extension in standing. Pt demoed improved single leg stance stability and control during 3rd and 4 th bouts, able to stiffen hip and knee with VC/TC while R foot was on step.  Pt returned to room after session and opted to sit in recliner. Pt was left with all needs in reach and alarm active.   Session 2: Pt missed 9 min d/t previous pt care. pt received in bed and agreeable to therapy. Pt asleep but easily roused. Session focused on ambulating forward, back, and  laterally. Pt ambulated forward x 40 ft and 2 x 20 ft and backward 2 x 20 ft. Pt with difficulty reaching BLE back when walking backward, demoed incr LOB on 2nd bout which she recovered with mod A. Pt demoed good awareness of being off balance and stopped to regain balance before moving again. Transitioned to laterally walking with hall rail for BUE support, 2 x 12 ft for improved lateral hip stability and balance. Pt returned to recliner after session and was left with all needs in reach and alarm active.   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

## 2021-02-14 LAB — GLUCOSE, CAPILLARY
Glucose-Capillary: 116 mg/dL — ABNORMAL HIGH (ref 70–99)
Glucose-Capillary: 140 mg/dL — ABNORMAL HIGH (ref 70–99)
Glucose-Capillary: 93 mg/dL (ref 70–99)
Glucose-Capillary: 209 mg/dL — ABNORMAL HIGH (ref 70–99)

## 2021-02-14 MED ORDER — CLOPIDOGREL BISULFATE 75 MG PO TABS
75.0000 mg | ORAL_TABLET | Freq: Every day | ORAL | Status: DC
  Administered 2021-02-15 – 2021-02-21 (×7): 75 mg via ORAL
  Filled 2021-02-14 (×7): qty 1

## 2021-02-14 MED ORDER — GLIMEPIRIDE 2 MG PO TABS
2.0000 mg | ORAL_TABLET | Freq: Every day | ORAL | Status: DC
  Administered 2021-02-15 – 2021-02-21 (×7): 2 mg via ORAL
  Filled 2021-02-14 (×7): qty 1

## 2021-02-14 NOTE — Progress Notes (Signed)
Speech Language Pathology Daily Session Note  Patient Details  Name: Lindsay Rice MRN: [id number] Date of Birth: 09-22-1950  Today's Date: 02/14/2021 SLP Individual Time: 3299-2426 SLP Individual Time Calculation (min): 29 min  Short Term Goals: Week 1: SLP Short Term Goal 1 (Week 1): Patient will complete complex problem solving tasks with sup A verbal cues SLP Short Term Goal 2 (Week 1): Patient will complete simulated medication/financial management tasks with sup A verbal cues to achieve 90% accuracy SLP Short Term Goal 3 (Week 1): Pt will alternate attention between 2 functional tasks provided Supervision A verbal cues  Skilled Therapeutic Interventions: Pt seen for skilled ST with focus on cognitive goals, pt upright in recliner and motivated for therapeutic tasks. SLP facilitating mildly complex problem solving activity by providing overall min A cues for 100% accuracy. Pt reports this task was more challenging for her than baseline which she thinks is a result of CVA. Pt would like to continue working on mental flexibility tasks, left with word search puzzle for independent activity which pt enjoys. Discussed questions related to stroke and diet management, pt has been independently reading Stroke education resources. Pt left in recliner with alarm belt activated and all needs within reach. Cont ST POC.   Pain Pain Assessment Pain Scale: 0-10 Pain Score: 0-No pain  Therapy/Group: Individual Therapy  Tacey Ruiz 02/14/2021, 2:42 PM

## 2021-02-14 NOTE — Progress Notes (Signed)
Physical Therapy Session Note  Patient Details  Name: Lindsay Rice MRN: [id number] Date of Birth: Jul 03, 1950  Today's Date: 02/14/2021 PT Individual Time: 8325-4982 PT Individual Time Calculation (min): 83 min   Short Term Goals: Week 1:  PT Short Term Goal 1 (Week 1): Pt will ambulate with supervision assist for short distances PT Short Term Goal 2 (Week 1): Pt will initiate stair training PT Short Term Goal 3 (Week 1): Pt will demonstrate >5 min standing activity without L knee buckling  Skilled Therapeutic Interventions/Progress Updates:    pt received in bed and agreeable to therapy. Supine>sit with supervision. Pt then donned socks and shoes with supervision with adaptive techniques. Small LOB while sitting x 2 but pt able to recover independently. Stand pivot transfer to w/c with min A. Pt transported to therapy gym for time management and energy conservation.   Session focused on high intensity gait training with lite gait system to improve confidence, balance, and for strength. Education focused on incr step length and step height by cues for incr knee flexion and swing through. Verbal and tactile cueing with intermittent assist for swing. Pt walked for 25 min, 1277 ft (.24 mi) in 6 bouts. First bout .7 mph, 2 bouts at 0. for improvement in motor control and to integrate motor plan, 3 bouts at . for endurance and to normalize gait pattern. During rest breaks, discussed gait pattern and mechanics with visual and verbal explanation. Pt reported RPE=13/20 during last bout  Pt returned to room and performed CGA ambulatory transfer to recliner with improved gait mechanics and balance in a tight space. Pt remained in recliner and was left with all needs in reach and alarm active.   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

## 2021-02-14 NOTE — Progress Notes (Signed)
PROGRESS NOTE   Subjective/Complaints:  Slept well- from 7:30 pm to 5am then fell back asleep.  LBM this AM.    ROS:   Pt denies SOB, abd pain, CP, N/V/C/D, and vision changes    Objective:   No results found. Recent Labs    02/13/21 0735  WBC 10.3  HGB 11.9*  HCT 36.3  PLT 271   Recent Labs    02/13/21 0735  NA 136  K 4.1  CL 100  CO2 24  GLUCOSE 193*  BUN 20  CREATININE 1.26*  CALCIUM 8.9    Intake/Output Summary (Last 24 hours) at 02/14/2021 0951 Last data filed at 02/14/2021 0700 Gross per 24 hour  Intake 1432 ml  Output --  Net 1432 ml        Physical Exam: Vital Signs Blood pressure 124/71, pulse 80, temperature 98.2 F (36.8 C), resp. rate 16, height 5\' 5"  (1.651 m), SpO2 91 %.    General: awake, alert, appropriate, sitting up in bed; NAD HENT: conjugate gaze; oropharynx moist CV: regular rate; no JVD Pulmonary: CTA B/L; no W/R/R- good air movement GI: soft, NT, ND, (+)BS Psychiatric: appropriate Neurological: Ox3 Extremities: No clubbing, cyanosis, or edema. Pulses are 2+ Skin: Clean and intact without signs of breakdown Neuro:  Alert and oriented x 3. Normal insight and awareness. Intact Memory. Normal language and speech. Cranial nerve exam unremarkable except for left central 7 and left perioral numbness. RUE and RLE 5/5. LUE 3-+ to 3/5 prox to distal. LLE 4- prox to 4/5 distally. Decreased sensory loss along left hand. Mild L inattention  Musculoskeletal: Full ROM, No pain with AROM or PROM in the neck, trunk, or extremities. Posture appropriate     Assessment/Plan: 1. Functional deficits which require 3+ hours per day of interdisciplinary therapy in a comprehensive inpatient rehab setting. Physiatrist is providing close team supervision and 24 hour management of active medical problems listed below. Physiatrist and rehab team continue to assess barriers to discharge/monitor  patient progress toward functional and medical goals  Care Tool:  Bathing    Body parts bathed by patient: Right arm, Left arm, Chest, Abdomen, Front perineal area, Buttocks, Right upper leg, Left upper leg, Right lower leg, Left lower leg, Face         Bathing assist Assist Level: Minimal Assistance - Patient > 75%     Upper Body Dressing/Undressing Upper body dressing   What is the patient wearing?: Bra, Pull over shirt    Upper body assist Assist Level: Minimal Assistance - Patient > 75%    Lower Body Dressing/Undressing Lower body dressing      What is the patient wearing?: Hospital gown only, Pants     Lower body assist Assist for lower body dressing: Minimal Assistance - Patient > 75%     Toileting Toileting    Toileting assist Assist for toileting: Minimal Assistance - Patient > 75%     Transfers Chair/bed transfer  Transfers assist     Chair/bed transfer assist level: Minimal Assistance - Patient > 75%     Locomotion Ambulation   Ambulation assist      Assist level: Minimal Assistance - Patient >  75% Assistive device: Walker-rolling     Walk 10 feet activity   Assist     Assist level: Minimal Assistance - Patient > 75% Assistive device: Walker-rolling   Walk 50 feet activity   Assist    Assist level: Minimal Assistance - Patient > 75% Assistive device: Walker-rolling    Walk 150 feet activity   Assist Walk 150 feet activity did not occur: Safety/medical concerns         Walk 10 feet on uneven surface  activity   Assist Walk 10 feet on uneven surfaces activity did not occur: Safety/medical concerns         Wheelchair     Assist Is the patient using a wheelchair?: Yes Type of Wheelchair: Manual    Wheelchair assist level: Total Assistance - Patient < 25% Max wheelchair distance: 300 ft    Wheelchair 50 feet with 2 turns activity    Assist        Assist Level: Total Assistance - Patient < 25%    Wheelchair 150 feet activity     Assist      Assist Level: Total Assistance - Patient < 25%   Blood pressure 124/71, pulse 80, temperature 98.2 F (36.8 C), resp. rate 16, height 5\' 5"  (1.651 m), SpO2 91 %.  Medical Problem List and Plan: 1.  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             -patient may shower             -ELOS/Goals: 7-10 days, mod I to supervision goals  Continue CIR- PT, OT- team conference today- to determine length of stay.  2.  Antithrombotics: -DVT/anticoagulation:  Pharmaceutical: Lovenox             -antiplatelet therapy: Aspirin 81 mg daily and Plavix 75 mg daily x3 weeks then Plavix alone 3. Pain Management: Elavil 25 mg nightly, Neurontin 300 mg twice daily, Fioricet as needed. 4. Mood: BuSpar 5 mg twice daily, Zoloft 100 mg daily and 50 mg nightly             -moods seems up beat. Family appears supportive.   12/26- bright- con't regimen and monitor             -antipsychotic agents: N/A 5. Neuropsych: This patient is capable of making decisions on her own behalf. 6. Skin/Wound Care: Routine skin checks 7. Fluids/Electrolytes/Nutrition: Routine in and outs with follow-up chemistries 8.  Permissive hypertension.  Currently on Norvasc 5 mg daily.  Patient also on HCTZ 25 mg daily, Cozaar 100 mg every morning, Lopressor 100 mg every morning and 50 mg nightly prior to admission.   -in "range" at present. Continue to follow 12/27- BP controlled- con't regimen 9.  History of migraine headaches.  Depakote 500 mg nightly,Ajovy 225 mg subcutaneous every 30 days 10.  OSA.  Continue inhalers as directed 11.  Hypothyroidism.  Synthroid 12.  Diabetes mellitus.  Hemoglobin A1c 8.3.  Jardiance 25 mg daily.  Check blood sugars before meals and at bedtime  CBG (last 3)  Recent Labs    02/13/21 1641 02/13/21 2111 02/14/21 0556   GLUCAP 158* 164* 116*   12/24 -monitor with jardiance on board. Seems to need better control    -consider amaryl trial  12/27- CBGs 110s-190- will increase Amaryl to 2 mg daily.  13.  Hyperlipidemia.  Lipitor 14.  Obesity.  BMI 33.31.  Dietary follow-up  15. AKI  12/26- Cr up to 1.26 from 0.93- BUN 20- so just shy of being dry per guidelines- will encourage fluids and recheck Wednesday. 12/27- labs in AM    LOS: 4 days A FACE TO FACE EVALUATION WAS PERFORMED  Lindsay Rice 02/14/2021, 9:51 AM

## 2021-02-14 NOTE — Progress Notes (Signed)
[facility] Individual Statement of Services  Patient Name:  Lindsay Rice  Date:  02/14/2021  Welcome to the [facility].  Our goal is to provide you with an individualized program based on your diagnosis and situation, designed to meet your specific needs.  With this comprehensive rehabilitation program, you will be expected to participate in at least 3 hours of rehabilitation therapies Monday-Friday, with modified therapy programming on the weekends.  Your rehabilitation program will include the following services:  Physical Therapy (PT), Occupational Therapy (OT), Speech Therapy (ST), 24 hour per day rehabilitation nursing, Therapeutic Recreaction (TR), Care Coordinator, Rehabilitation Medicine, Nutrition Services, and Pharmacy Services  Weekly team conferences will be held on Tuesday to discuss your progress.  Your Inpatient Rehabilitation Care Coordinator will talk with you frequently to get your input and to update you on team discussions.  Team conferences with you and your family in attendance may also be held.  Expected length of stay: 10-14 days  Overall anticipated outcome: supervision with cues  Depending on your progress and recovery, your program may change. Your Inpatient Rehabilitation Care Coordinator will coordinate services and will keep you informed of any changes. Your Inpatient Rehabilitation Care Coordinator's name and contact numbers are listed  below.  The following services may also be recommended but are not provided by the [facility]:   Home Health Rehabiltiation Services Outpatient Rehabilitation Services    Arrangements will be made to provide these services after discharge if needed.  Arrangements include referral to agencies that provide these services.  Your insurance has been verified to be:  Medicare-BCBS-Tricare Your primary doctor is:  Tobie Lords  Pertinent information will be shared with  your doctor and your insurance company.  Inpatient Rehabilitation Care Coordinator:  Dossie Der, [place] [phone] or Luna [place]  Information discussed with and copy given to patient by: [name], 02/14/2021, 10:22 AM

## 2021-02-14 NOTE — Progress Notes (Signed)
Inpatient Rehabilitation Care Coordinator Assessment and Plan Patient Details  Name: Lindsay Rice MRN: [id number] Date of Birth: [birth year]  Today's Date: 02/14/2021  Hospital Problems: Principal Problem:   Infarction of right basal ganglia [facility])  Past Medical History:  Past Medical History:  Diagnosis Date   Diabetes mellitus without complication (HCC)    Hypertension    Migraine headache    Mitral valve prolapse    Obesity (BMI 30.0-34.9)    Sleep apnea    Past Surgical History: History reviewed. No pertinent surgical history. Social History:  reports that she has never smoked. She has never used smokeless tobacco. She reports that she does not currently use alcohol. She reports that she does not use drugs.  Family / Support Systems Marital Status: Married Patient Roles: Spouse, Parent, Other (Comment) (grandmother) Spouse/Significant Other: Fayrene Fearing [phone] Children: Grown children and grandchildren Other Supports: Friends and church members Anticipated Caregiver: Family members Ability/Limitations of Caregiver: husband is 69 yo and has some health issues Caregiver Availability: 24/7 Family Dynamics: Close knit family who are supportive and willing to assist pt if needed. Checking on husband while pt is here  Social History Preferred language: English Religion:  Cultural Background: No issues Education: HS Health Literacy - How often do you need to have someone help you when you read instructions, pamphlets, or other written material from your doctor or pharmacy?: Never Writes: Yes Employment Status: Retired Marine scientist Issues: No issues Guardian/Conservator: None-according to MD pt is capable of making her own decisions while here   Abuse/Neglect Abuse/Neglect Assessment Can Be Completed: Yes Physical Abuse: Denies Verbal Abuse: Denies Sexual Abuse: Denies Exploitation of patient/patient's resources: Denies Self-Neglect: Denies  Patient  response to: Social Isolation - How often do you feel lonely or isolated from those around you?: Sometimes  Emotional Status Pt's affect, behavior and adjustment status: Pt is motivated to do well here and regain her independence. She has always been able to do for herself even with her health issues. She feels she can reach mod/i goals while here Recent Psychosocial Issues: other health issues Psychiatric History: no history/issues May benefit from seeing neuro-psych while here if long enough to be seen Substance Abuse History: No issues  Patient / Family Perceptions, Expectations & Goals Pt/Family understanding of illness & functional limitations: Pt and husband can explain her stroke and deficits, she is improving which is encouraging to both. Both talk with the MD and feel understand her treatment plan. Anixous to hear team conference Premorbid pt/family roles/activities: Wife, mom, grandmother, retiree, church member, etc Anticipated changes in roles/activities/participation: resume Pt/family expectations/goals: Pt states: " I hope to continue to do well and be independent when I leave here."  Husband states: " I will do what I can for her."  [employer]: None Premorbid Home Care/DME Agencies: Other (Comment) (rw,rollator, cane, tub seat) Transportation available at discharge: Husband and family members Is the patient able to respond to transportation needs?: Yes In the past 12 months, has lack of transportation kept you from medical appointments or from getting medications?: No In the past 12 months, has lack of transportation kept you from meetings, work, or from getting things needed for daily living?: No  Discharge Planning Living Arrangements: Spouse/significant other Support Systems: Spouse/significant other, Children, Other relatives, Friends/neighbors, Church/faith community Type of Residence: Private residence Insurance Resources: [employer], Marine scientist (specify) (Tricare & BCBS) Financial Resources: Social Security, Family Support Financial Screen Referred: No Living Expenses: Own Money Management: Patient, Spouse Does  the patient have any problems obtaining your medications?: No Home Management: Both assist with Patient/Family Preliminary Plans: Return home with husband who is able to do some for her, but not 24/7 physical care, due to his own health issues. Pt hopeful will only be here for a short time but wants to be mod/i when does. Aware team conference today and will touch base with again once had meeting. Care Coordinator Barriers to Discharge: Decreased caregiver support Care Coordinator Anticipated Follow Up Needs: HH/OP  Clinical Impression Pleasant female who is motivated to do well here and improve and get back to her mod/I level. Her husband is involved but 69 yo and has some health issues so can only provide supervision level. Will update once team conference and work on discharge needs.  [name] 02/14/2021, 10:55 AM

## 2021-02-14 NOTE — Progress Notes (Signed)
Patient ID: Lindsay Rice, female   DOB: [birth year], 70 y.o.   MRN: [id number]  Met with pt to discuss team conference goals of mod/I-supervision for steps and tub transfers. She is pleased with her progress and is working hard in her therapies. Discussed follow up therapies she prefers home health due to weather and germs out there. Will work on discharge needs. Aware target discharge date is 1/3.

## 2021-02-14 NOTE — Patient Care Conference (Signed)
Inpatient RehabilitationTeam Conference and Plan of Care Update Date: 02/14/2021   Time: 11:50 AM    Patient Name: Lindsay Rice      Medical Record Number: [id number]  Date of Birth: [birth year] Sex: Female         Room/Bed: 5C18C/5C18C-01 Payor Info: Payor: MEDICARE / Plan: MEDICARE PART A AND B / Product Type: *No Product type* /    Admit Date/Time:  02/10/2021  2:56 PM  Primary Diagnosis:  Infarction of right basal ganglia [facility])  Hospital Problems: Principal Problem:   Infarction of right basal ganglia [facility])    Expected Discharge Date: Expected Discharge Date: 02/21/21  Team Members Present: Physician leading conference: Dr. Genice Rouge Social Worker Present: Dossie Der, LCSW Nurse Present: Kennyth Arnold, RN PT Present: Bernie Covey, PT OT Present: Earleen Newport, OT SLP Present: Eilene Ghazi, SLP PPS Coordinator present : Edson Snowball, PT     Current Status/Progress Goal Weekly Team Focus  Bowel/Bladder   continent b/b  remain continent  toilet as needed   Swallow/Nutrition/ Hydration             ADL's   Min A overall for bathing, dressing, and toileting. Min/CGA for transfers with RW, mildly impulsive  Mod I  standing balance/tolerance, LUE NMR, core/sitting balance, ADL retraining   Mobility   min A-CGA gait and transfers, impulsive  mod I transfers, supervision gait and stairs  NMR, dynamic balance, high intensity gait training   Communication             Safety/Cognition/ Behavioral Observations  Supervision  Mod I  med management and discharge from ST   Pain   no reported pain  remain pain free  assess pain q 4hr and prn   Skin   CDI  no new breakdown  assess skin q shift and prn     Discharge Planning:  Home with husband who can provide assist-headache today but still wanting to participate in therapies   Team Discussion: Right basal ganglia infarct. Receiving medications for mood and pain. BP controlled. Increased diabetes medication. Will  check labs tomorrow. Continent B/B, no pain reported. Staff to push fluids. Lives with 8 yr old spouse. Min assist overall currently. Has left side inattention, slightly impulsive. Pushing gait training very hard. Supervision overall with SLP, close to baseline. Has 3 steps to enter home.  Patient on target to meet rehab goals: yes, mod I for mobility, supervision for ADL's, and mod I/supervision for SLP.  *See Care Plan and progress notes for long and short-term goals.   Revisions to Treatment Plan:  Not at this time  Teaching Needs: Family education, medication management, safety awareness, transfer/gait training, stair training, etc.  Current Barriers to Discharge: Decreased caregiver support, Home enviroment access/layout, Lack of/limited family support, Weight, Medication compliance, and stairs  Possible Resolutions to Barriers: Family education Follow-up PT/OT/SLP or HEP Order recommended DME Work on stairs     Medical Summary Current Status: L hemi _ R BG infarct; skin good- continent of B/B-  Barriers to Discharge: Other (comments);Decreased family/caregiver support;Home enviroment access/layout;Medical stability;New diabetic;Weight;Weight bearing restrictions  Barriers to Discharge Comments: 85 yr old husband- going home with him 24/7- Possible Resolutions to [name]: at min A currently- poor L awareness- slightly impulsive; goals Supervision to mod I- has 3 steps- limitations- impulsiveness and L inattention- migraines- cognitively doing well- close ot baseline Supervison- d/c 02/21/21   Continued Need for Acute Rehabilitation Level of Care: The patient requires daily medical management by  a physician with specialized training in physical medicine and rehabilitation for the following reasons: Direction of a multidisciplinary physical rehabilitation program to maximize functional independence : Yes Medical management of patient stability for increased activity  during participation in an intensive rehabilitation regime.: Yes Analysis of laboratory values and/or radiology reports with any subsequent need for medication adjustment and/or medical intervention. : Yes   I attest that I was present, lead the team conference, and concur with the assessment and plan of the team.   Tennis Must 02/14/2021, 4:19 PM

## 2021-02-14 NOTE — Progress Notes (Signed)
Occupational Therapy Session Note  Patient Details  Name: Lindsay Rice MRN: [id number] Date of Birth: 1950-11-09  Today's Date: 02/14/2021 OT Individual Time: 3220-2542 OT Individual Time Calculation (min): 54 min    Short Term Goals: Week 1:  OT Short Term Goal 1 (Week 1): Patient will complete toilet transfer with CGA OT Short Term Goal 2 (Week 1): Patient withh use L hand to brush hair for 50% of activity OT Short Term Goal 3 (Week 1): Patient will maintain standing balance at the sink with CGA in preparationf or BADL tasks OT Short Term Goal 4 (Week 1): Patient will recall hemi dressing techniuqes without cues   Skilled Therapeutic Interventions/Progress Updates:    Pt greeted at time of session sitting up in recliner finishing up vitals with NT. Pt with no pain and agreeable to OT session. Pt performing functional mobility to/from toilet with CGA and RW, Min/CGA to control descent to commode and CGA for hygiene and clothing management. Ambulating bathroom > sink > gym on 5C with CGA and RW. Pt completed 9 hole peg test for objective measurements as pt is concerned about LUE return, results are as follows: R hand 24.86 and L hand 1:21. Reviewed FMC activity in room for tying/untying knots in theraband and provided with high level resistance sponge for grip strengthening as well as functional grasp/release pattern utilizing 3 second hold and pt demonstration 1x10. Transported 5C <> 4th floor main gym for time conservation. 1x15 overhead throws with lightweight soccer ball for bimanual integration and GMC. 2x20 large therapy ball hits with 1# dowel for BUE coordination and improving body awareness. Pt with questions about reoccurring strokes, educated on decreasing unhealthy fats and healthy lifestyle to decrease risk of another stroke. Pt back in room, ambulated to recliner CGA and alarm on call bell in reach.   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

## 2021-02-15 DIAGNOSIS — S46001S Unspecified injury of muscle(s) and tendon(s) of the rotator cuff of right shoulder, sequela: Secondary | ICD-10-CM

## 2021-02-15 LAB — BASIC METABOLIC PANEL
Anion gap: 10 (ref 5–15)
BUN: 13 mg/dL (ref 8–23)
CO2: 29 mmol/L (ref 22–32)
Calcium: 8.7 mg/dL — ABNORMAL LOW (ref 8.9–10.3)
Chloride: 103 mmol/L (ref 98–111)
Creatinine, Ser: 0.94 mg/dL (ref 0.44–1.00)
GFR, Estimated: >60 mL/min (ref >60)
Glucose, Bld: 131 mg/dL — ABNORMAL HIGH (ref 70–99)
Potassium: 4 mmol/L (ref 3.5–5.1)
Sodium: 142 mmol/L (ref 135–145)

## 2021-02-15 LAB — GLUCOSE, CAPILLARY
Glucose-Capillary: 133 mg/dL — ABNORMAL HIGH (ref 70–99)
Glucose-Capillary: 107 mg/dL — ABNORMAL HIGH (ref 70–99)
Glucose-Capillary: 154 mg/dL — ABNORMAL HIGH (ref 70–99)
Glucose-Capillary: 186 mg/dL — ABNORMAL HIGH (ref 70–99)

## 2021-02-15 MED ORDER — DICLOFENAC SODIUM 1 % EX GEL
2.0000 g | Freq: Three times a day (TID) | CUTANEOUS | Status: DC
  Administered 2021-02-15 – 2021-02-21 (×18): 2 g via TOPICAL
  Filled 2021-02-15 (×2): qty 100

## 2021-02-15 NOTE — Progress Notes (Signed)
Physical Therapy Session Note  Patient Details  Name: Lindsay Rice MRN: [id number] Date of Birth: 18-Oct-1950  Today's Date: 02/15/2021 PT Individual Time: 6568-1275, 1700-1749 PT Individual Time Calculation (min): 72 min, 28 min   Short Term Goals: Week 1:  PT Short Term Goal 1 (Week 1): Pt will ambulate with supervision assist for short distances PT Short Term Goal 2 (Week 1): Pt will initiate stair training PT Short Term Goal 3 (Week 1): Pt will demonstrate >5 min standing activity without L knee buckling  Skilled Therapeutic Interventions/Progress Updates:    Session 1: pt received in bed and agreeable to therapy. Pt reports 8/10 pain in R shoulder and 4/10 in L shoulder. Reports MD has prescribed voltaren for pain but it has not arrived yet. Therapy to tolerance with rest breaks and repositioning as needed. Sit to stand and ambulating in room distances with CGA with RW.  Bed mobility with HOB elevated per home set up. Pt donned socks, pants, shirt, and shoes with distant supervision. Assist to tie shoes for time. Pt then stood from bed and ambulated to bathroom with CGA. Pt had bout of bowel incontinence in brief, was able to change brief and perform hygiene with supervision, including donning/doffing shoes and pants. Pt ambulated to sink and performed hand hygiene in same manner.   Pt directed in BERG balance test as noted below: Patient demonstrates increased fall risk as noted by score of   36/56 on Berg Balance Scale.  (<36= high risk for falls, close to 100%; 37-45 significant >80%; 46-51 moderate >50%; 52-55 lower >25%)    Pt participated in and ambulated 429 ft with RW and CGA with no standing rest breaks. Discussed results and implications of both tests. Pt then directed in ambulation with focus on technique over speed, VC for RW proximity and step clearance.  Pt returned to room and sat in recliner with CGA. Pt was left with all needs in reach and alarm active.   Session  2: Pt received in recliner and agreeable to therapy.  Pt with no immediate c/o pain requiring intervention. Sit to stand with supervision to RW throughout session. Pt ambulated to therapy gym with CGA. Session focused on NMR for improved gait and stair navigation. All activities with BUE support on RW with supervision and VC/tactile cueing on LLE. Step taps on 4" step, some difficulty with lifting LLE and controlling hyperextension during stance. Progressed to lunges with foot on step for improved quad activation and weight shifting. Progressed to same activity on 8" step x 2 bouts. Pt returned to room in the same manner and remained in recliner, was left with all needs in reach and alarm active.   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, impulsive Restrictions Weight Bearing Restrictions: No General:   Vital Signs: Therapy Vitals Temp: (!) 97.5 F (36.4 C) Pulse Rate: 87 Resp: 18 BP: 134/76 Patient Position (if appropriate): Lying Oxygen Therapy SpO2: 94 % Pain:   Mobility:   Locomotion :    Trunk/Postural Assessment :    Balance: Programmer, systems Test Sit to Stand: Able to stand without using hands and stabilize independently Standing Unsupported: Able to stand safely 2 minutes Sitting with Back Unsupported but Feet Supported on Floor or Stool: Able to sit safely and securely 2 minutes Stand to Sit: Sits safely with minimal use of hands Transfers: Able to transfer safely, definite need of hands Standing Unsupported with Eyes Closed: Able to stand 10  seconds with supervision Standing Ubsupported with Feet Together: Able to place feet together independently and stand for 1 minute with supervision From Standing, Reach Forward with Outstretched Arm: Can reach forward >12 cm safely (5") From Standing Position, Pick up Object from Floor: Able to pick up shoe, needs supervision From Standing Position, Turn to Look  Behind Over each Shoulder: Looks behind one side only/other side shows less weight shift Turn 360 Degrees: Able to turn 360 degrees safely but slowly Standing Unsupported, Alternately Place Feet on Step/Stool: Needs assistance to keep from falling or unable to try Standing Unsupported, One Foot in Front: Loses balance while stepping or standing Standing on One Leg: Unable to try or needs assist to prevent fall Total Score: 36 Exercises:   Other Treatments:      Therapy/Group: Individual Therapy  Juluis Rainier 02/15/2021, 8:10 AM

## 2021-02-15 NOTE — Progress Notes (Signed)
Occupational Therapy Session Note  Patient Details  Name: Lindsay Rice MRN: [id number] Date of Birth: 1950-06-13  Today's Date: 02/15/2021 OT Individual Time: 1030-1055 and 1445-1540 OT Individual Time Calculation (min): 25 min and 55 min   Short Term Goals: Week 1:  OT Short Term Goal 1 (Week 1): Patient will complete toilet transfer with CGA OT Short Term Goal 2 (Week 1): Patient withh use L hand to brush hair for 50% of activity OT Short Term Goal 3 (Week 1): Patient will maintain standing balance at the sink with CGA in preparationf or BADL tasks OT Short Term Goal 4 (Week 1): Patient will recall hemi dressing techniuqes without cues   Skilled Therapeutic Interventions/Progress Updates:    Session 1: Pt greeted at time of session sitting up in recliner agreeable to OT session, dressed and ready to go for OT session. Declined ADL in favor of doing this afternoon. Pt ambulating recliner <> gym on 5C with RW with CGA/supervision and performed all transfers in this same manner during session. Focused on LUE FMC/GMC for 2 rounds of playing connect 4, first round in sitting and second round standing. Remainder of session on pt ed/training for theraputty with yellow grade for flattening, rolls into log, pinches, squeezes, etc for 1x5-10 each. Pt ambulating back to room same manner as above, alarm on call bell in reach.    Session 2: Pt greeted at time of session up in recliner stating she is very fatigued from therapy sessions today, no pain during session. Pt politely declining shower this session 2/2 fatigue. Pt agreeable to go outside as she has not been outside in weeks. Ambulated short distance recliner <> wheelchair with Supervision/CGA and RW. Pt transported outside for fresh air, vitamin D, and to uplift spirits. Pt grateful for time outside and notably feeling uplifted. Patient ed/discussion regarding CVA prevention, lifestyle changes, healthy eating choices, signs and symptoms of CVA for  future if needed, as well as IADL tips for work simplification and easier access at home. Pt grateful for all information. Transported back to room and ambulated to recliner same as above. Alarm on call bell in reach.   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

## 2021-02-15 NOTE — Progress Notes (Signed)
PROGRESS NOTE   Subjective/Complaints:  Pt in good spirits. Working on hand grip with foam piece when I came in. Does report right shoulder pain which has flared up. Has torn RTC twice. Has had surgery once.   ROS: Patient denies fever, rash, sore throat, blurred vision, nausea, vomiting, diarrhea, cough, shortness of breath or chest pain, joint or back pain, headache, or mood change.     Objective:   No results found. Recent Labs    02/13/21 0735  WBC 10.3  HGB 11.9*  HCT 36.3  PLT 271   Recent Labs    02/13/21 0735 02/15/21 0548  NA 136 142  K 4.1 4.0  CL 100 103  CO2 24 29  GLUCOSE 193* 131*  BUN 20 13  CREATININE 1.26* 0.94  CALCIUM 8.9 8.7*    Intake/Output Summary (Last 24 hours) at 02/15/2021 0942 Last data filed at 02/15/2021 0800 Gross per 24 hour  Intake 1200 ml  Output --  Net 1200 ml        Physical Exam: Vital Signs Blood pressure 134/76, pulse 87, temperature (!) 97.5 F (36.4 C), resp. rate 18, height 5\' 5"  (1.651 m), SpO2 94 %.    Constitutional: No distress . Vital signs reviewed. HEENT: NCAT, EOMI, oral membranes moist Neck: supple Cardiovascular: RRR without murmur. No JVD    Respiratory/Chest: CTA Bilaterally without wheezes or rales. Normal effort    GI/Abdomen: BS +, non-tender, non-distended Ext: no clubbing, cyanosis, or edema Psych: pleasant and cooperative  Skin: Clean and intact without signs of breakdown Neuro:  Alert and oriented x 3. Normal insight and awareness. Intact Memory. Normal language and speech. Cranial nerve exam unremarkable except for left central 7 and left perioral numbness. RUE and RLE 5/5. LUE 3+/5 prox to distal. LLE 4- prox to 4/5 distally. Decreased sensory loss along left hand. Mild L inattention improving.  Musculoskeletal: left shoulder + pain with IR and impingement maneuver   Assessment/Plan: 1. Functional deficits which require 3+ hours  per day of interdisciplinary therapy in a comprehensive inpatient rehab setting. Physiatrist is providing close team supervision and 24 hour management of active medical problems listed below. Physiatrist and rehab team continue to assess barriers to discharge/monitor patient progress toward functional and medical goals  Care Tool:  Bathing    Body parts bathed by patient: Right arm, Left arm, Chest, Abdomen, Front perineal area, Buttocks, Right upper leg, Left upper leg, Right lower leg, Left lower leg, Face         Bathing assist Assist Level: Minimal Assistance - Patient > 75%     Upper Body Dressing/Undressing Upper body dressing   What is the patient wearing?: Bra, Pull over shirt    Upper body assist Assist Level: Minimal Assistance - Patient > 75%    Lower Body Dressing/Undressing Lower body dressing      What is the patient wearing?: Hospital gown only, Pants     Lower body assist Assist for lower body dressing: Minimal Assistance - Patient > 75%     Toileting Toileting    Toileting assist Assist for toileting: Contact Guard/Touching assist     Transfers Chair/bed transfer  Transfers assist  Chair/bed transfer assist level: Minimal Assistance - Patient > 75%     Locomotion Ambulation   Ambulation assist      Assist level: Minimal Assistance - Patient > 75% Assistive device: Walker-rolling     Walk 10 feet activity   Assist     Assist level: Minimal Assistance - Patient > 75% Assistive device: Walker-rolling   Walk 50 feet activity   Assist    Assist level: Minimal Assistance - Patient > 75% Assistive device: Walker-rolling    Walk 150 feet activity   Assist Walk 150 feet activity did not occur: Safety/medical concerns         Walk 10 feet on uneven surface  activity   Assist Walk 10 feet on uneven surfaces activity did not occur: Safety/medical concerns         Wheelchair     Assist Is the patient using a  wheelchair?: Yes Type of Wheelchair: Manual    Wheelchair assist level: Total Assistance - Patient < 25% Max wheelchair distance: 300 ft    Wheelchair 50 feet with 2 turns activity    Assist        Assist Level: Total Assistance - Patient < 25%   Wheelchair 150 feet activity     Assist      Assist Level: Total Assistance - Patient < 25%   Blood pressure 134/76, pulse 87, temperature (!) 97.5 F (36.4 C), resp. rate 18, height 5\' 5"  (1.651 m), SpO2 94 %.  Medical Problem List and Plan: 1.  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             -patient may shower             -ELOS/Goals: 02/21/21, mod I to supervision goals  -Continue CIR therapies including PT, OT  2.  Antithrombotics: -DVT/anticoagulation:  Pharmaceutical: Lovenox             -antiplatelet therapy: Aspirin 81 mg daily and Plavix 75 mg daily x3 weeks then Plavix alone 3. Pain Management/chronic right RTC injury: Elavil 25 mg nightly, Neurontin 300 mg twice daily, Fioricet as needed.  12/28 -discussed appropriate mechanics and engaging left arm more in functional activities   -apply voltaren gel to right shoulder TID 4. Mood: BuSpar 5 mg twice daily, Zoloft 100 mg daily and 50 mg nightly             -moods seems up beat. Family appears supportive.   12/28 in good spirits             -antipsychotic agents: N/A 5. Neuropsych: This patient is capable of making decisions on her own behalf. 6. Skin/Wound Care: Routine skin checks 7. Fluids/Electrolytes/Nutrition: Routine in and outs with follow-up chemistries 8.  Permissive hypertension.  Currently on Norvasc 5 mg daily.  Patient also on HCTZ 25 mg daily, Cozaar 100 mg every morning, Lopressor 100 mg every morning and 50 mg nightly prior to admission.   -in "range" at present. Continue to follow 12/28- BP controlled- con't regimen 9.   History of migraine headaches.  Depakote 500 mg nightly,Ajovy 225 mg subcutaneous every 30 days 10.  OSA.  Continue inhalers as directed 11.  Hypothyroidism.  Synthroid 12.  Diabetes mellitus.  Hemoglobin A1c 8.3.  Jardiance 25 mg daily.  Check blood sugars before meals and at bedtime  CBG (last 3)  Recent Labs    02/14/21  1645 02/14/21 2056 02/15/21 0619  GLUCAP 93 209* 133*   12/24 -monitor with jardiance on board. Seems to need better control    -consider amaryl trial  12/28- CBGs 110s-200   -Amaryl increased to 2 mg daily effective today--observe 13.  Hyperlipidemia.  Lipitor 14.  Obesity.  BMI 33.31.  Dietary follow-up  15. AKI  12/26- Cr up to 1.26 from 0.93- BUN 20- so just shy of being dry per guidelines- will encourage fluids and recheck Wednesday. 12/28 Cr 0.94 today, likely just pre-renal bump  -continue to encourage fluids   LOS: 5 days A FACE TO FACE EVALUATION WAS PERFORMED  Ranelle Oyster 02/15/2021, 9:42 AM

## 2021-02-15 NOTE — Progress Notes (Signed)
Physical Therapy Session Note  Patient Details  Name: Lindsay Rice MRN: [id number] Date of Birth: Jan 28, 1951  Today's Date: 02/15/2021 PT Individual Time: 3976-7341 PT Individual Time Calculation (min): 23 min   Short Term Goals: Week 1:  PT Short Term Goal 1 (Week 1): Pt will ambulate with supervision assist for short distances PT Short Term Goal 2 (Week 1): Pt will initiate stair training PT Short Term Goal 3 (Week 1): Pt will demonstrate >5 min standing activity without L knee buckling  Skilled Therapeutic Interventions/Progress Updates:   Received pt sitting in recliner, pt agreeable to PT treatment, and denied any pain during session. Session with emphasis on functional mobility, generalized strengthening, ambulation, and improved activity tolerance. Sit<>stands with RW and CGA throughout session. Pt ambulated 62ft x 2 trials with RW and CGA to/from therapy gym. Pt performed BUE/LE strengthening on Nustep at workload 4 for 8 minutes for a total of 399 steps with emphasis on cardiovascular endurance. Pt requested to sit in recliner at end of session. Concluded session with pt sitting in recliner, needs within reach, and chair pad alarm on. Provided pt with fresh drink.   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

## 2021-02-15 NOTE — Progress Notes (Signed)
Occupational Therapy Session Note  Patient Details  Name: Lindsay Rice MRN: [id number] Date of Birth: [birth year]  Today's Date: 02/15/2021 OT Individual Time: 7800-4471 OT Individual Time Calculation (min): 39 min    Short Term Goals: Week 1:  OT Short Term Goal 1 (Week 1): Patient will complete toilet transfer with CGA OT Short Term Goal 2 (Week 1): Patient withh use L hand to brush hair for 50% of activity OT Short Term Goal 3 (Week 1): Patient will maintain standing balance at the sink with CGA in preparationf or BADL tasks OT Short Term Goal 4 (Week 1): Patient will recall hemi dressing techniuqes without cues  Skilled Therapeutic Interventions/Progress Updates:    Pt received seated on toilet with NT present, no c/o pain and, agreeable to therapy. Session focus on self-care retraining, activity tolerance, transfer retraining, dynamic standing balance, LUE NMR in prep for improved ADL/IADL/func mobility performance + decreased caregiver burden. Completed toileting tasks and toilet transfer with use of grab bar/ RW with distant S. Washed hands at sink with distant S, as well.  Ambulated to and from gym with close S and RW.   Standing at [place], completed the following games to target LUE NMR/FMC:  -Single Target with circle size of 1: 37% accuracy, 3.19 reaction time, 37 hits               58.68% accuracy, 3.30 reaction time, 34 hits  -Rotator Sequence: 96.15% accuracy, 2.73 reaction time, 25 hits  Finally, participated in various [facility] with theraputty and playing cards involving shuffling/flipping/stacking cards. Pt oftens play Rummy at home with husband. Pt able to return demonstration theraputty exercises including: pinch and pull, putty squeeze, rolling into log/ball shapes, and digit flexion with good technique.  Pt left seated in recliner with chair alarm engaged, call bell in reach, and all immediate needs met.    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    Therapy/Group: Individual Therapy  Volanda Napoleon MS, OTR/L  02/15/2021, 1:58 PM

## 2021-02-16 LAB — GLUCOSE, CAPILLARY
Glucose-Capillary: 130 mg/dL — ABNORMAL HIGH (ref 70–99)
Glucose-Capillary: 87 mg/dL (ref 70–99)
Glucose-Capillary: 165 mg/dL — ABNORMAL HIGH (ref 70–99)
Glucose-Capillary: 161 mg/dL — ABNORMAL HIGH (ref 70–99)

## 2021-02-16 NOTE — Progress Notes (Signed)
Occupational Therapy Session Note  Patient Details  Name: Lindsay Rice MRN: [id number] Date of Birth: 1950/03/03  Today's Date: 02/17/2021 OT Individual Time: 1020-1115 OT Individual Time Calculation (min): 55 min    Short Term Goals: Week 1:  OT Short Term Goal 1 (Week 1): Patient will complete toilet transfer with CGA OT Short Term Goal 2 (Week 1): Patient withh use L hand to brush hair for 50% of activity OT Short Term Goal 3 (Week 1): Patient will maintain standing balance at the sink with CGA in preparationf or BADL tasks OT Short Term Goal 4 (Week 1): Patient will recall hemi dressing techniuqes without cues  Skilled Therapeutic Interventions/Progress Updates:    Pt greeted in the recliner with no c/o pain. Showered yesterday and did not want to shower again today. Started by working on her Lt arm, noted some difficultly with fine motor coordination and hand strength. Brought pt in a HEP targeting pinching and pulling skills. Pt motivated to use another HEP, already has HEPs in the room that she's been using. Transitioned to simple folding task for bimanual coordination, starting with towels, wash cloths, and then pillowcases. Pt with minimal grip slip on the Lt with the pillowcases, but able to recognize/correct with min cuing. Next worked on dynamic standing balance while ambulating using cane in the hallway, pt requiring close supervision-CGA for balance. Increased therapeutic challenge by incorporating head turns while negotiating a straight path. Also worked on side-stepping in both directions, pt needing visual feedback to meet task demands while traveling in a straight path. Difficult for pt to side-step towards the Lt, pt requiring CGA and exhibiting some small LOBs. She transferred to a chair x1 and also a low cushion seat without armrests x2, close supervision for sit<stands using cane. Pt then ambulated back to the room and transferred to her recliner. She remained sitting up, in  care of NT.   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

## 2021-02-16 NOTE — Progress Notes (Signed)
PROGRESS NOTE   Subjective/Complaints:  R shoulder pain from previous RTC tear- voltaren helping some.    ROS:  Pt denies SOB, abd pain, CP, N/V/C/D, and vision changes    Objective:   No results found. No results for input(s): WBC, HGB, HCT, PLT in the last 72 hours.  Recent Labs    02/15/21 0548  NA 142  K 4.0  CL 103  CO2 29  GLUCOSE 131*  BUN 13  CREATININE 0.94  CALCIUM 8.7*    Intake/Output Summary (Last 24 hours) at 02/16/2021 1011 Last data filed at 02/16/2021 0748 Gross per 24 hour  Intake 798 ml  Output --  Net 798 ml        Physical Exam: Vital Signs Blood pressure 139/66, pulse 94, temperature 98.3 F (36.8 C), temperature source Oral, resp. rate 17, height 5\' 5"  (1.651 m), weight 89.3 kg, SpO2 92 %.     General: awake, alert, appropriate, sitting EOB working with RN to get dressed/pants on; NAD HENT: conjugate gaze; oropharynx moist CV: regular rate; no JVD Pulmonary: CTA B/L; no W/R/R- good air movement GI: soft, NT, ND, (+)BS Psychiatric: appropriate- interactive Neurological: ox3-  Ext: no clubbing, cyanosis, or edema Psych: pleasant and cooperative  Skin: Clean and intact without signs of breakdown Neuro:  Alert and oriented x 3. Normal insight and awareness. Intact Memory. Normal language and speech. Cranial nerve exam unremarkable except for left central 7 and left perioral numbness. RUE and RLE 5/5. LUE 3+/5 prox to distal. LLE 4- prox to 4/5 distally. Decreased sensory loss along left hand. Mild L inattention improving.  Musculoskeletal: left shoulder + pain with IR and impingement maneuver   Assessment/Plan: 1. Functional deficits which require 3+ hours per day of interdisciplinary therapy in a comprehensive inpatient rehab setting. Physiatrist is providing close team supervision and 24 hour management of active medical problems listed below. Physiatrist and rehab team  continue to assess barriers to discharge/monitor patient progress toward functional and medical goals  Care Tool:  Bathing    Body parts bathed by patient: Right arm, Left arm, Chest, Abdomen, Front perineal area, Buttocks, Right upper leg, Left upper leg, Right lower leg, Left lower leg, Face         Bathing assist Assist Level: Minimal Assistance - Patient > 75%     Upper Body Dressing/Undressing Upper body dressing   What is the patient wearing?: Bra, Pull over shirt    Upper body assist Assist Level: Minimal Assistance - Patient > 75%    Lower Body Dressing/Undressing Lower body dressing      What is the patient wearing?: Hospital gown only, Pants     Lower body assist Assist for lower body dressing: Minimal Assistance - Patient > 75%     Toileting Toileting    Toileting assist Assist for toileting: Supervision/Verbal cueing     Transfers Chair/bed transfer  Transfers assist     Chair/bed transfer assist level: Contact Guard/Touching assist     Locomotion Ambulation   Ambulation assist      Assist level: Minimal Assistance - Patient > 75% Assistive device: Walker-rolling     Walk 10 feet activity  Assist     Assist level: Minimal Assistance - Patient > 75% Assistive device: Walker-rolling   Walk 50 feet activity   Assist    Assist level: Minimal Assistance - Patient > 75% Assistive device: Walker-rolling    Walk 150 feet activity   Assist Walk 150 feet activity did not occur: Safety/medical concerns         Walk 10 feet on uneven surface  activity   Assist Walk 10 feet on uneven surfaces activity did not occur: Safety/medical concerns         Wheelchair     Assist Is the patient using a wheelchair?: Yes Type of Wheelchair: Manual    Wheelchair assist level: Total Assistance - Patient < 25% Max wheelchair distance: 300 ft    Wheelchair 50 feet with 2 turns activity    Assist        Assist Level:  Total Assistance - Patient < 25%   Wheelchair 150 feet activity     Assist      Assist Level: Total Assistance - Patient < 25%   Blood pressure 139/66, pulse 94, temperature 98.3 F (36.8 C), temperature source Oral, resp. rate 17, height 5\' 5"  (1.651 m), weight 89.3 kg, SpO2 92 %.  Medical Problem List and Plan: 1.  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             -patient may shower             -ELOS/Goals: 02/21/21, mod I to supervision goals  -con't CIR_ PT and OT 2.  Antithrombotics: -DVT/anticoagulation:  Pharmaceutical: Lovenox             -antiplatelet therapy: Aspirin 81 mg daily and Plavix 75 mg daily x3 weeks then Plavix alone 3. Pain Management/chronic right RTC injury: Elavil 25 mg nightly, Neurontin 300 mg twice daily, Fioricet as needed.  12/28 -discussed appropriate mechanics and engaging left arm more in functional activities   -apply voltaren gel to right shoulder TID  12/29- R shoulder pain a little better- con't regimen 4. Mood: BuSpar 5 mg twice daily, Zoloft 100 mg daily and 50 mg nightly             -moods seems up beat. Family appears supportive.   12/28 in good spirits             -antipsychotic agents: N/A 5. Neuropsych: This patient is capable of making decisions on her own behalf. 6. Skin/Wound Care: Routine skin checks 7. Fluids/Electrolytes/Nutrition: Routine in and outs with follow-up chemistries 8.  Permissive hypertension.  Currently on Norvasc 5 mg daily.  Patient also on HCTZ 25 mg daily, Cozaar 100 mg every morning, Lopressor 100 mg every morning and 50 mg nightly prior to admission.   -in "range" at present. Continue to follow12/29- BP controlled- con't regimen 9.  History of migraine headaches.  Depakote 500 mg nightly,Ajovy 225 mg subcutaneous every 30 days 10.  OSA.  Continue inhalers as directed 11.   Hypothyroidism.  Synthroid 12.  Diabetes mellitus.  Hemoglobin A1c 8.3.  Jardiance 25 mg daily.  Check blood sugars before meals and at bedtime  CBG (last 3)  Recent Labs    02/15/21 1633 02/15/21 2136 02/16/21 0600  GLUCAP 154* 186* 130*   12/24 -monitor with jardiance on board. Seems to need better control    -consider amaryl trial  12/28- CBGs 110s-200   -  Amaryl increased to 2 mg daily effective today--observe  12/29- CBGs slightly better- just got dose increase yesterday- will monitor 13.  Hyperlipidemia.  Lipitor 14.  Obesity.  BMI 33.31.  Dietary follow-up  15. AKI  12/26- Cr up to 1.26 from 0.93- BUN 20- so just shy of being dry per guidelines- will encourage fluids and recheck Wednesday. 12/28 Cr 0.94 today, likely just pre-renal bump  -continue to encourage fluids   LOS: 6 days A FACE TO FACE EVALUATION WAS PERFORMED  Keary Hanak 02/16/2021, 10:11 AM

## 2021-02-16 NOTE — Progress Notes (Signed)
Speech Language Pathology Daily Session Note  Patient Details  Name: Lindsay Rice MRN: [id number] Date of Birth: 1950/12/19  Today's Date: 02/16/2021 SLP Individual Time: 6962-9528 SLP Individual Time Calculation (min): 40 min  Short Term Goals: Week 1: SLP Short Term Goal 1 (Week 1): Patient will complete complex problem solving tasks with sup A verbal cues SLP Short Term Goal 2 (Week 1): Patient will complete simulated medication/financial management tasks with sup A verbal cues to achieve 90% accuracy SLP Short Term Goal 3 (Week 1): Pt will alternate attention between 2 functional tasks provided Supervision A verbal cues  Skilled Therapeutic Interventions:   Patient seen for skilled ST session focused on cognitive function goals. Patient sitting up in recliner and working on some OT exercises when SLP entered room. SLP introduced several different higher level cognitive tasks that patient reported she had not done before. Following brief instructions, patient able to complete task with 100% accuracy, demonstrating good awareness to errors and self correction though she was not completely sure her responses were correct until reviewing with SLP. SLP provided patient with current list of her medications and plan to complete pill box organization task next session with likely plan to discharge from ST services at that time. Patient left in recliner with all needs within reach. She continues to benefit from skilled SLP intervention to maximize cognitive function prior to discharge.   Pain Pain Assessment Pain Scale: 0-10 Pain Score: 0-No pain  Therapy/Group: Individual Therapy  Angela Nevin, MA, CCC-SLP Speech Therapy

## 2021-02-16 NOTE — Progress Notes (Signed)
Occupational Therapy Session Note  Patient Details  Name: Lindsay Rice MRN: [id number] Date of Birth: 07-Aug-1950  Today's Date: 02/16/2021 OT Individual Time: 1100-1156 and 1415-1443 OT Individual Time Calculation (min): 56 min and 28 min   Short Term Goals: Week 1:  OT Short Term Goal 1 (Week 1): Patient will complete toilet transfer with CGA OT Short Term Goal 2 (Week 1): Patient withh use L hand to brush hair for 50% of activity OT Short Term Goal 3 (Week 1): Patient will maintain standing balance at the sink with CGA in preparationf or BADL tasks OT Short Term Goal 4 (Week 1): Patient will recall hemi dressing techniuqes without cues   Skilled Therapeutic Interventions/Progress Updates:    Session 1: Pt greeted at time of session sitting up in recliner agreeable to OT session and stating she is fatigued from AM PT session. Pt stating that she has been using cane with PT this am and it went well. Pt agreeable to shower level bathing. Pt ambulating from recliner > bathroom > sink all with SPC and CGA/Supervision. Pt transferring to tub bench in shower same manner, doffing LB clothing in standing and finishing doffing seated. Pt performed UB/LB bathing seated on bench with supervision, using figure four for feet and other adaptive techniques. Pt dried off seated, initially tried to stand to dry off legs but educated on fall risk and finished seated on bench. UB dress supervision includin bra and pull over shirt, LB dressing CGA for pull up and pants before ambulating to recliner level to finish dressing with socks/shoes. Pt ambulating with cane reclinder <> sink CGA/supervision and performed grooming and using hair dryer in standing. Set up in recliner alarm on call bell in reach all needs met.   Session 2: Pt greeted at time of session sleeping in recliner easily woken and agreeable to OT session, no pain, and fatigued but willing to try. Pt ambulating with hurrycane recliner > wheelchair CGA  and transported to 4th floor ortho gym. Focused on LUE NMR and increasing GMC/FMC and global body awareness with multiple rounds of seated BITS activities. Pt performing single dots for 2 min, seated R/W/B sequence graded up for smaller targets and increased speed, letterz a-Z scanning across visual field, and 5 tracing shapes with LUE only. Cues throughout for decreasing compensatory techniques. Back in room, walked > bathroom > recliner with CGA and hurrycane, 3/3 toileting tasks Supervision/CGA. Up in recliner alarm on call bell in reach.   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

## 2021-02-16 NOTE — Progress Notes (Signed)
Physical Therapy Session Note  Patient Details  Name: Lindsay Rice MRN: [id number] Date of Birth: 1950-05-16  Today's Date: 02/16/2021 PT Individual Time: 0900-1000 PT Individual Time Calculation (min): 60 min   Short Term Goals: Week 1:  PT Short Term Goal 1 (Week 1): Pt will ambulate with supervision assist for short distances PT Short Term Goal 2 (Week 1): Pt will initiate stair training PT Short Term Goal 3 (Week 1): Pt will demonstrate >5 min standing activity without L knee buckling  Skilled Therapeutic Interventions/Progress Updates:    Pt received in recliner and agreeable to therapy.  No complaint of pain. Session focused on gait training and NMR. Pt ambulated to 4th floor gym with CGA and RW. Demoes poor foot clearance, decr stance time on LLE and short step length. Stair training with 6" steps x 4, mimicking home rail set up for top 2 steps with CGA. Pt then directed in HS curl with 4lb ankle weights for improved HS activation during swing phase of gait. Progressed to split stance knee drive activity with ankle weights for improved weight shift, stance stability, and swing clearance. Pt then ambulated 2 x 90 ft with ankle weights for improved proprioception, demoing nearly normalized gait pattern. Good carryover when weights removed for 2 x 90 ft without weights. RW and CGA both trials. Transitioned to //bars where pt performed trials of forward walking with UUE and no UE support, and backward walking with BUE support, CGA. VC for incr step length when walking backward. Pt then trialled hurrycane x 50 ft before becoming fatigued. During this trial and ambulatory transfer in room, pt demoed incr need for CGA d/t balance, but improved posture and overall smoother mobility d/t not managing RW. Pt used bathroom at end of session, distant supervision for 3/3 toileting tasks. Pt returned to recliner and was left with all needs in reach and alarm active.   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

## 2021-02-17 LAB — GLUCOSE, CAPILLARY
Glucose-Capillary: 124 mg/dL — ABNORMAL HIGH (ref 70–99)
Glucose-Capillary: 79 mg/dL (ref 70–99)
Glucose-Capillary: 134 mg/dL — ABNORMAL HIGH (ref 70–99)
Glucose-Capillary: 202 mg/dL — ABNORMAL HIGH (ref 70–99)

## 2021-02-17 NOTE — Progress Notes (Signed)
PROGRESS NOTE   Subjective/Complaints:  Pt reports wants ot get dressed before PT_ will let nursing know.  LBM yesterday but wants to have another one today.  Working on grip strength- denies pain.  Tying knots to work on dexterity.   ROS:   Pt denies SOB, abd pain, CP, N/V/C/D, and vision changes   Objective:   No results found. No results for input(s): WBC, HGB, HCT, PLT in the last 72 hours.  Recent Labs    02/15/21 0548  NA 142  K 4.0  CL 103  CO2 29  GLUCOSE 131*  BUN 13  CREATININE 0.94  CALCIUM 8.7*    Intake/Output Summary (Last 24 hours) at 02/17/2021 1147 Last data filed at 02/17/2021 0740 Gross per 24 hour  Intake 1121 ml  Output --  Net 1121 ml        Physical Exam: Vital Signs Blood pressure (!) 144/68, pulse 82, temperature 97.8 F (36.6 C), temperature source Oral, resp. rate 18, height 5\' 5"  (1.651 m), weight 89.3 kg, SpO2 93 %.      General: awake, alert, appropriate,sitting up in bed;  NAD HENT: conjugate gaze; oropharynx moist- wearing dentures CV: regular rate; no JVD Pulmonary: CTA B/L; no W/R/R- good air movement GI: soft, NT, ND, (+)BS Psychiatric: appropriate; flat affect Neurological: Ox3   Ext: no clubbing, cyanosis, or edema Psych: pleasant and cooperative  Skin: Clean and intact without signs of breakdown Neuro:  Alert and oriented x 3. Normal insight and awareness. Intact Memory. Normal language and speech. Cranial nerve exam unremarkable except for left central 7 and left perioral numbness. RUE and RLE 5/5. LUE 3+/5 prox to distal. LLE 4- prox to 4/5 distally. Decreased sensory loss along left hand. Mild L inattention improving.  Musculoskeletal: left shoulder + pain with IR and impingement maneuver   Assessment/Plan: 1. Functional deficits which require 3+ hours per day of interdisciplinary therapy in a comprehensive inpatient rehab setting. Physiatrist is  providing close team supervision and 24 hour management of active medical problems listed below. Physiatrist and rehab team continue to assess barriers to discharge/monitor patient progress toward functional and medical goals  Care Tool:  Bathing    Body parts bathed by patient: Right arm, Left arm, Chest, Abdomen, Front perineal area, Buttocks, Right upper leg, Left upper leg, Right lower leg, Left lower leg, Face         Bathing assist Assist Level: Supervision/Verbal cueing     Upper Body Dressing/Undressing Upper body dressing   What is the patient wearing?: Bra, Pull over shirt    Upper body assist Assist Level: Supervision/Verbal cueing    Lower Body Dressing/Undressing Lower body dressing      What is the patient wearing?: Pants, Underwear/pull up     Lower body assist Assist for lower body dressing: Contact Guard/Touching assist     Toileting Toileting    Toileting assist Assist for toileting: Supervision/Verbal cueing     Transfers Chair/bed transfer  Transfers assist     Chair/bed transfer assist level: Contact Guard/Touching assist     Locomotion Ambulation   Ambulation assist      Assist level: Minimal Assistance - Patient >  75% Assistive device: Walker-rolling     Walk 10 feet activity   Assist     Assist level: Minimal Assistance - Patient > 75% Assistive device: Walker-rolling   Walk 50 feet activity   Assist    Assist level: Minimal Assistance - Patient > 75% Assistive device: Walker-rolling    Walk 150 feet activity   Assist Walk 150 feet activity did not occur: Safety/medical concerns         Walk 10 feet on uneven surface  activity   Assist Walk 10 feet on uneven surfaces activity did not occur: Safety/medical concerns         Wheelchair     Assist Is the patient using a wheelchair?: Yes Type of Wheelchair: Manual    Wheelchair assist level: Total Assistance - Patient < 25% Max wheelchair  distance: 300 ft    Wheelchair 50 feet with 2 turns activity    Assist        Assist Level: Total Assistance - Patient < 25%   Wheelchair 150 feet activity     Assist      Assist Level: Total Assistance - Patient < 25%   Blood pressure (!) 144/68, pulse 82, temperature 97.8 F (36.6 C), temperature source Oral, resp. rate 18, height 5\' 5"  (1.651 m), weight 89.3 kg, SpO2 93 %.  Medical Problem List and Plan: 1.  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             -patient may shower             -ELOS/Goals: 02/21/21, mod I to supervision goals  Con't CIR- PT and OT 2.  Antithrombotics: -DVT/anticoagulation:  Pharmaceutical: Lovenox             -antiplatelet therapy: Aspirin 81 mg daily and Plavix 75 mg daily x3 weeks then Plavix alone 3. Pain Management/chronic right RTC injury: Elavil 25 mg nightly, Neurontin 300 mg twice daily, Fioricet as needed.  12/28 -discussed appropriate mechanics and engaging left arm more in functional activities   -apply voltaren gel to right shoulder TID  12/30- pain controlled today- con't regimen 4. Mood: BuSpar 5 mg twice daily, Zoloft 100 mg daily and 50 mg nightly             -moods seems up beat. Family appears supportive.   12/28 in good spirits             -antipsychotic agents: N/A 5. Neuropsych: This patient is capable of making decisions on her own behalf. 6. Skin/Wound Care: Routine skin checks 7. Fluids/Electrolytes/Nutrition: Routine in and outs with follow-up chemistries 8.  Permissive hypertension.  Currently on Norvasc 5 mg daily.  Patient also on HCTZ 25 mg daily, Cozaar 100 mg every morning, Lopressor 100 mg every morning and 50 mg nightly prior to admission.   -in "range" at present. Continue to follow12/29- BP controlled- con't regimen 12/30 BP very slightly elevated, but overall has been  controlled- con't regimen 9.  History of migraine headaches.  Depakote 500 mg nightly,Ajovy 225 mg subcutaneous every 30 days 10.  OSA.  Continue inhalers as directed 11.  Hypothyroidism.  Synthroid 12.  Diabetes mellitus.  Hemoglobin A1c 8.3.  Jardiance 25 mg daily.  Check blood sugars before meals and at bedtime  CBG (last 3)  Recent Labs    02/16/21 2112 02/17/21 0547 02/17/21 1113  GLUCAP 161* 124* 79  12/24 -monitor with jardiance on board. Seems to need better control    -consider amaryl trial  12/28- CBGs 110s-200   -Amaryl increased to 2 mg daily effective today--observe  12/29- CBGs slightly better- just got dose increase yesterday- will monitor 12/30- Bgs better- on't regimen 13.  Hyperlipidemia.  Lipitor 14.  Obesity.  BMI 33.31.  Dietary follow-up  15. AKI  12/26- Cr up to 1.26 from 0.93- BUN 20- so just shy of being dry per guidelines- will encourage fluids and recheck Wednesday. 12/28 Cr 0.94 today, likely just pre-renal bump  -continue to encourage fluids   LOS: 7 days A FACE TO FACE EVALUATION WAS PERFORMED  Sheran Newstrom 02/17/2021, 11:47 AM

## 2021-02-17 NOTE — Progress Notes (Signed)
Physical Therapy Session Note  Patient Details  Name: Lindsay Rice MRN: [id number] Date of Birth: 1950/08/28  Today's Date: 02/17/2021 PT Individual Time: 0800-0915, 2458-0998 PT Individual Time Calculation (min): 75 min, 36 min   Short Term Goals: Week 1:  PT Short Term Goal 1 (Week 1): Pt will ambulate with supervision assist for short distances PT Short Term Goal 2 (Week 1): Pt will initiate stair training PT Short Term Goal 3 (Week 1): Pt will demonstrate >5 min standing activity without L knee buckling  Skilled Therapeutic Interventions/Progress Updates:    Session 1: Pt received in recliner and agreeable to therapy.  Pt reports continued pain in her shoulder and knee, addressed with medication and voltaren gel by nsg during session. Session focused on high intensity gait training. Pt ambulated to/from day room on 4th floor (>300 ft) with hurrycane in addition to the following: 190 ft x 5 with hurrycane and CGA/supervision. Utilized music to assist with rhythm to equalize stance time b/w BLE. Pt reports that she feels she fatigues quickly while walking, which is worse with cane vs RW. Discussed appropriate times for each AD. At this time, pt reports 8/10 pain in her R knee. Addressed with patellar mobs and trigger point release in vastus lateralis, as well as corrective exercise. Noted decr medial glide in patella and triggerpoint in vastus lateralis, both with concordant pain. Delayed firing of vastus medialis vs vastus lateralis noted BIL. Pt directed in quad set and SAQ to assist with knee pain and discussed options for follow up care for BIL knees. Pt ambulated back to room and remained in recliner, was left with all needs in reach and alarm active.   Session 2: Pt received in recliner and agreeable to therapy.  No immediate pain, but pt reported pain with activity so shifted to different activity. Pt requested to use bathroom. ambulatory transfer to toilet with cane. Distant supervision  for 3/3 toileting tasks. Standing hand hygiene in the same manner. Pt then turned to sit in w/c, but did so quickly and lost balance with uncontrolled sitting to w/c. Discussed importance of slowing down while turning to sit. Pt transported to therapy gym for time management and energy conservation. Attempted quadruped but pt reported BIL shoulder pain so discontinued activity. Pt then directed in gait forward and backward, 2 x 3 laps of 13 ft. VC for larger step back. Occ LOB with no more than CGA to recover. PT then directed in side stepping, 4 laps of 15 ft, for coordination and BIL hip strength. Pt returned to recliner at end of session and was left with all needs in reach and alarm active.   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

## 2021-02-17 NOTE — Progress Notes (Signed)
Speech Language Pathology Discharge Summary  Patient Details  Name: Lindsay Rice MRN: [id number] Date of Birth: [birth year]  Today's Date: 02/17/2021 SLP Individual Time: 1350-1430 SLP Individual Time Calculation (min): 40 min   Skilled Therapeutic Interventions:  Patient seen for skilled ST session focused on cognitive function goals. Patient demonstrated ability to manage medications during pill box simulation activity and is at modified independent level. Patient continues to be very motivated to improve with her physical deficits s/p CVA but her cognitive functioning appears to be [facility]. Patient is appropriate for discharge from [place] services at this time.     Patient has met 2 of 2 long term goals.  Patient to discharge at overall Modified Independent level.  Reasons goals not met: N/A   Clinical Impression/Discharge Summary: Patient made excellent progress and met both of her LTG's. At time of discharge, patient was functioning at modified independent level for complex level cognition. She demonstrated ability to complete complex level problem solving, organization tasks as well as perform function ADL tasks such as money and medication management. SLP is not recommending further ST services during this CIR stay or upon discharge home.  Care Partner:  Caregiver Able to Provide Assistance: Other (comment)  Type of Caregiver Assistance: Cognitive;Physical  Recommendation:  None      Equipment: None for ST   Reasons for discharge: Treatment goals met   Patient/Family Agrees with Progress Made and Goals Achieved: Yes    Sonia Baller, MA, CCC-SLP Speech Therapy

## 2021-02-18 DIAGNOSIS — I639 Cerebral infarction, unspecified: Secondary | ICD-10-CM

## 2021-02-18 LAB — GLUCOSE, CAPILLARY
Glucose-Capillary: 111 mg/dL — ABNORMAL HIGH (ref 70–99)
Glucose-Capillary: 73 mg/dL (ref 70–99)
Glucose-Capillary: 146 mg/dL — ABNORMAL HIGH (ref 70–99)
Glucose-Capillary: 185 mg/dL — ABNORMAL HIGH (ref 70–99)

## 2021-02-18 MED ORDER — FREMANEZUMAB-VFRM 225 MG/1.5ML ~~LOC~~ SOAJ
225.0000 mg | SUBCUTANEOUS | Status: DC
Start: 2021-02-20 — End: 2021-02-21
  Administered 2021-02-19: 225 mg via SUBCUTANEOUS
  Filled 2021-02-18 (×2): qty 1.5

## 2021-02-18 NOTE — Progress Notes (Signed)
PROGRESS NOTE   Subjective/Complaints: Appreciate pharm assistance with ordering her Ajovy She has no complaints Patient's chart reviewed- No issues reported overnight Vitals signs stable   ROS:   Pt denies SOB, abd pain, CP, N/V/C/D, and vision changes   Objective:   No results found. No results for input(s): WBC, HGB, HCT, PLT in the last 72 hours.  No results for input(s): NA, K, CL, CO2, GLUCOSE, BUN, CREATININE, CALCIUM in the last 72 hours.   Intake/Output Summary (Last 24 hours) at 02/18/2021 1900 Last data filed at 02/18/2021 1831 Gross per 24 hour  Intake 720 ml  Output --  Net 720 ml        Physical Exam: Vital Signs Blood pressure (!) 152/62, pulse 97, temperature 98.4 F (36.9 C), resp. rate 17, height 5\' 5"  (1.651 m), weight 89.3 kg, SpO2 93 %.  Gen: no distress, normal appearing HEENT: oral mucosa pink and moist, NCAT Cardio: Reg rate Chest: normal effort, normal rate of breathing Abd: soft, non-distended   Ext: no clubbing, cyanosis, or edema Psych: pleasant and cooperative  Skin: Clean and intact without signs of breakdown Neuro:  Alert and oriented x 3. Normal insight and awareness. Intact Memory. Normal language and speech. Cranial nerve exam unremarkable except for left central 7 and left perioral numbness. RUE and RLE 5/5. LUE 3+/5 prox to distal. LLE 4- prox to 4/5 distally. Decreased sensory loss along left hand. Mild L inattention improving.  Musculoskeletal: left shoulder + pain with IR and impingement maneuver   Assessment/Plan: 1. Functional deficits which require 3+ hours per day of interdisciplinary therapy in a comprehensive inpatient rehab setting. Physiatrist is providing close team supervision and 24 hour management of active medical problems listed below. Physiatrist and rehab team continue to assess barriers to discharge/monitor patient progress toward functional and medical  goals  Care Tool:  Bathing    Body parts bathed by patient: Right arm, Left arm, Chest, Abdomen, Front perineal area, Buttocks, Right upper leg, Left upper leg, Right lower leg, Left lower leg, Face         Bathing assist Assist Level: Supervision/Verbal cueing     Upper Body Dressing/Undressing Upper body dressing   What is the patient wearing?: Bra, Pull over shirt    Upper body assist Assist Level: Supervision/Verbal cueing    Lower Body Dressing/Undressing Lower body dressing      What is the patient wearing?: Pants, Underwear/pull up     Lower body assist Assist for lower body dressing: Contact Guard/Touching assist     Toileting Toileting    Toileting assist Assist for toileting: Supervision/Verbal cueing     Transfers Chair/bed transfer  Transfers assist     Chair/bed transfer assist level: Contact Guard/Touching assist     Locomotion Ambulation   Ambulation assist      Assist level: Minimal Assistance - Patient > 75% Assistive device: Walker-rolling     Walk 10 feet activity   Assist     Assist level: Minimal Assistance - Patient > 75% Assistive device: Walker-rolling   Walk 50 feet activity   Assist    Assist level: Minimal Assistance - Patient > 75% Assistive device: Walker-rolling  Walk 150 feet activity   Assist Walk 150 feet activity did not occur: Safety/medical concerns         Walk 10 feet on uneven surface  activity   Assist Walk 10 feet on uneven surfaces activity did not occur: Safety/medical concerns         Wheelchair     Assist Is the patient using a wheelchair?: Yes Type of Wheelchair: Manual    Wheelchair assist level: Total Assistance - Patient < 25% Max wheelchair distance: 300 ft    Wheelchair 50 feet with 2 turns activity    Assist        Assist Level: Total Assistance - Patient < 25%   Wheelchair 150 feet activity     Assist      Assist Level: Total Assistance  - Patient < 25%   Blood pressure (!) 152/62, pulse 97, temperature 98.4 F (36.9 C), resp. rate 17, height 5\' 5"  (1.651 m), weight 89.3 kg, SpO2 93 %.  Medical Problem List and Plan: 1.  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             -patient may shower             -ELOS/Goals: 02/21/21, mod I to supervision goals  Continue CIR- PT and OT 2.  Antithrombotics: -DVT/anticoagulation:  Pharmaceutical: Lovenox             -antiplatelet therapy: Aspirin 81 mg daily and Plavix 75 mg daily x3 weeks then Plavix alone 3. Pain Management/chronic right RTC injury: Elavil 25 mg nightly, Neurontin 300 mg twice daily, Fioricet as needed.  12/28 -discussed appropriate mechanics and engaging left arm more in functional activities   -apply voltaren gel to right shoulder TID  12/31- pain controlled today- continue regimen 4. Anxiety: continue BuSpar 5 mg twice daily, Zoloft 100 mg daily and 50 mg nightly             -moods seems up beat. Family appears supportive.   12/28 in good spirits             -antipsychotic agents: N/A 5. Neuropsych: This patient is capable of making decisions on her own behalf. 6. Skin/Wound Care: Routine skin checks 7. Fluids/Electrolytes/Nutrition: Routine in and outs with follow-up chemistries 8.  Permissive hypertension.  Currently on Norvasc 5 mg daily.  Patient also on HCTZ 25 mg daily, Cozaar 100 mg every morning, Lopressor 100 mg every morning and 50 mg nightly prior to admission.   -in "range" at present. Continue to follow12/29- BP controlled- con't regimen 12/30 BP very slightly elevated, but overall has been controlled- con't regimen 9.  History of migraine headaches.  Depakote 500 mg nightly,Ajovy 225 mg subcutaneous every 30 days 10.  OSA.  Continue inhalers as directed 11.  Hypothyroidism.  Synthroid 12.  Diabetes mellitus.  Hemoglobin A1c  8.3.  Jardiance 25 mg daily.  Check blood sugars before meals and at bedtime  CBG (last 3)  Recent Labs    02/18/21 0602 02/18/21 1200 02/18/21 1708  GLUCAP 111* 73 146*   12/24 -monitor with jardiance on board. Seems to need better control    -consider amaryl trial  12/28- CBGs 110s-200   -Amaryl increased to 2 mg daily effective today--observe  12/29- CBGs slightly better- just got dose increase yesterday- will monitor 12/30- Bgs better- on't regimen 13.  Hyperlipidemia.  Lipitor 14.  Obesity.  BMI 33.31.  Dietary follow-up  15. AKI  12/26- Cr up to 1.26 from 0.93- BUN 20- so just shy of being dry per guidelines- will encourage fluids and recheck Wednesday. 12/28 Cr 0.94 today, likely just pre-renal bump  -continue to encourage fluids 16. Migraines: Ajovy ordered   LOS: 8 days A FACE TO FACE EVALUATION WAS PERFORMED  Javone Ybanez P Quanta Robertshaw 02/18/2021, 7:00 PM

## 2021-02-19 LAB — GLUCOSE, CAPILLARY
Glucose-Capillary: 108 mg/dL — ABNORMAL HIGH (ref 70–99)
Glucose-Capillary: 87 mg/dL (ref 70–99)
Glucose-Capillary: 156 mg/dL — ABNORMAL HIGH (ref 70–99)
Glucose-Capillary: 166 mg/dL — ABNORMAL HIGH (ref 70–99)

## 2021-02-19 NOTE — Progress Notes (Signed)
Occupational Therapy Session Note  Patient Details  Name: Lindsay Rice MRN: [id number] Date of Birth: 1950/03/02  Today's Date: 02/20/2021 OT Individual Time: 1001-1101 OT Individual Time Calculation (min): 60 min    Short Term Goals: Week 1:  OT Short Term Goal 1 (Week 1): Patient will complete toilet transfer with CGA OT Short Term Goal 2 (Week 1): Patient withh use L hand to brush hair for 50% of activity OT Short Term Goal 3 (Week 1): Patient will maintain standing balance at the sink with CGA in preparationf or BADL tasks OT Short Term Goal 4 (Week 1): Patient will recall hemi dressing techniuqes without cues  Skilled Therapeutic Interventions/Progress Updates:    Pt greeted in the recliner with no c/o pain. Wanting to wait until PM OT session for her shower today. Pt requested to work on her Lt hand. Therefore session focus was placed on functional use of the Lt hand. Pt ambulated using device at Mod I level to the small therapy gym. She stood without AD and participated in a graded card task for the Lt UE involving reaching overhead, crossing midline, mini squatting, and stooping to the floor to retrieve dropped items (using both Rt and Lt hand). Pt with no LOBs throughout. Transitioned to seated level task involving pennies, focusing on 3 jaw chuck grasp, pincer grasp, and in hand manipulations to improve fine motor coordination. Pt able to stack 15 pennies with increased time! She also participated in La[place] with the same skill focus as stated above, pt able to extricate 6 planks before collapsing the tower. We celebrated! Pt then ambulated back to the room in the same manner as stated above, left in the recliner with all needs within reach.   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

## 2021-02-19 NOTE — Progress Notes (Signed)
Occupational Therapy Discharge Summary  Patient Details  Name: Lindsay Rice MRN: [id number] Date of Birth: 1950/09/20   Patient has met 12 of 12 long term goals due to improved activity tolerance, improved balance, postural control, ability to compensate for deficits, functional use of  LEFT upper extremity, improved awareness, and improved coordination.  Patient to discharge at overall Modified Independent - occasional Supervision level.  Patient's care partner is independent to provide the necessary physical assistance at discharge.  Pt is performing functional mobility with hurrycane to/from bathroom, throughout room, etc with Mod I, shower level bathing with set up/supervision, shower transfers Supervision, and UB/LB dressing with Mod I. Pt to DC home with spouse to help PRN and family in the area.   Reasons goals not met: NA  Recommendation:  Patient will benefit from ongoing skilled OT services in outpatient setting to continue to advance functional skills in the area of BADL, iADL, and Reduce care partner burden.  Equipment: No equipment provided already has a shower seat   Reasons for discharge: treatment goals met and discharge from hospital  Patient/family agrees with progress made and goals achieved: Yes  OT Discharge Precautions/Restrictions  Precautions Precautions: Fall Precaution Comments: L hemipareisis UE>LE, mildly impulsive ADL ADL Eating: Independent Grooming: Modified independent Upper Body Bathing: Setup Where Assessed-Upper Body Bathing: Shower Lower Body Bathing: Setup Where Assessed-Lower Body Bathing: Shower Upper Body Dressing: Modified independent (Device) Lower Body Dressing: Modified independent Toileting: Modified independent Toilet Transfer: Modified independent Tub/Shower Transfer: Close supervison Social research officer, government: Close supervision Social research officer, government Method: Heritage manager: Radio broadcast assistant (using  cane) Vision Baseline Vision/History: 1 Wears glasses Patient Visual Report: No change from baseline Vision Assessment?: No apparent visual deficits Perception  Perception: Within Functional Limits Praxis Praxis: Intact Cognition Overall Cognitive Status: Within Functional Limits for tasks assessed Arousal/Alertness: Awake/alert Orientation Level: Oriented X4 Memory: Appears intact Immediate Memory Recall: Sock;Blue;Bed Memory Recall Sock: Without Cue Memory Recall Blue: Without Cue Memory Recall Bed: Without Cue Safety/Judgment: Appears intact Sensation Sensation Light Touch: Impaired by gross assessment Light Touch Impaired Details: Impaired LUE Coordination Gross Motor Movements are Fluid and Coordinated: No Fine Motor Movements are Fluid and Coordinated: No Coordination and Movement Description: mild L hemi but improved from eval Finger Nose Finger Test: Mild dysmetria in end ranges Motor  Motor Motor: Ataxia;Hemiplegia Motor - Skilled Clinical Observations: mild L hemipareisis Trunk/Postural Assessment  Cervical Assessment Cervical Assessment: Exceptions to [facility] (forward head) Thoracic Assessment Thoracic Assessment: Exceptions to [facility] (rounded shoulders) Lumbar Assessment Lumbar Assessment: Within Functional Limits Postural Control Postural Control: Within Functional Limits  Balance Balance Balance Assessed: Yes Dynamic Sitting Balance Dynamic Sitting - Balance Support: Feet supported;During functional activity Dynamic Sitting - Level of Assistance: 6: Modified independent (Device/Increase time) (doffing LB clothing sit<stand from EOB) Dynamic Standing Balance Dynamic Standing - Balance Support: During functional activity Dynamic Standing - Level of Assistance: 6: Modified independent (Device/Increase time) Dynamic Standing - Balance Activities: Lateral lean/weight shifting;Forward lean/weight shifting (toileting) Extremity/Trunk Assessment RUE Assessment RUE  Assessment: Within Functional Limits LUE Assessment LUE Assessment: Exceptions to [facility] Active Range of Motion (AROM) Comments: WFL General Strength Comments: roughly 3+ to 4-/5 overall, improved from eval LUE Body System: Neuro Brunstrum levels for arm and hand: Arm;Hand Brunstrum level for arm: Stage IV Movement is deviating from synergy Brunstrum level for hand: Stage IV Movements deviating from synergies;Stage V Independence from basic synergies   Tahiry Spicer A Shania Bjelland 02/20/2021, 2:25 PM

## 2021-02-19 NOTE — Progress Notes (Signed)
Occupational Therapy Session Note  Patient Details  Name: Lindsay Rice MRN: [id number] Date of Birth: 03-22-1950  Today's Date: 02/19/2021 OT Individual Time: 6484-7207 OT Individual Time Calculation (min): 55 min    Short Term Goals: Week 1:  OT Short Term Goal 1 (Week 1): Patient will complete toilet transfer with CGA OT Short Term Goal 2 (Week 1): Patient withh use L hand to brush hair for 50% of activity OT Short Term Goal 3 (Week 1): Patient will maintain standing balance at the sink with CGA in preparationf or BADL tasks OT Short Term Goal 4 (Week 1): Patient will recall hemi dressing techniuqes without cues   Skilled Therapeutic Interventions/Progress Updates:    Pt greeted at time of session up in recliner agreeable to OT session, no pain throughout. Pt ambulating with hurrycane room > elevator > 4th floor ADL apartment with Supervision and wheelchair in case fatigued but did not. Focused on ADL transfers to/from shower seat in simulated walk in shower transfer with side ways entry with simulated placement of grab bars, pt performed with supervision after therapist demonstration. Also transferring throughout apartment to/from low and soft sofa with 1 LOB but after educated on sitting at end to push up from arms, did so with supervision. Pt transported outside for fresh air and uplifting mood, pt grateful. While outside discussed DC planning and having PRN assist from family for transportation and more difficult IADL tasks. Back inside, pt performed 9 hole peg test for re measuring, R hand 29 seconds and L hand 43 seconds which is an improvement. Ambulated throughout room <> bathroom with Mod I, toileting same manner. Up in chair call bell in reach all needs met.    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, impulsive Restrictions Weight Bearing Restrictions: No     Therapy/Group: Individual Therapy  Viona Gilmore 02/19/2021, 12:27 PM

## 2021-02-19 NOTE — Progress Notes (Signed)
Occupational Therapy Session Note  Patient Details  Name: Lindsay Rice MRN: [id number] Date of Birth: [birth year]  Today's Date: 02/19/2021 OT Individual Time: 5916-3846 OT Individual Time Calculation (min): 60 min    Short Term Goals: Week 1:  OT Short Term Goal 1 (Week 1): Patient will complete toilet transfer with CGA OT Short Term Goal 2 (Week 1): Patient withh use L hand to brush hair for 50% of activity OT Short Term Goal 3 (Week 1): Patient will maintain standing balance at the sink with CGA in preparationf or BADL tasks OT Short Term Goal 4 (Week 1): Patient will recall hemi dressing techniuqes without cues  Skilled Therapeutic Interventions/Progress Updates:  Pt greeted  seated in recliner agreeable to OT intervention. Session focus on functional mobility, LUE FMC, and decreasing overall caregiver burden.  Pt declined need for ADL participation, pt completed functional mobility from recliner in room to elevator on 5C. Pt transported remainder of distance for time mgmt in w/c. Pt worked on [facility] tasks in gym including threading beads on string with LUE, pt able to complete task with supervision even preferring to use smaller beads as added challenge. Pt also engaged in seated [facility]  task with push pins where pt instructed to use LUE to outline image on cork board with pins. Pt completed task with supervision and increased time and effort with pt having most difficulty pushing the pins into the cork d/t decreased sensation in digits, good pincer grasp noted and translation of pins in hands.  Functional cognition further assessed with The Pillbox Test: A Measure of Executive Functioning and Estimate of Medication Management. A straight pass/fail designation is determined by 3 or more errors of omission or misplacement on the task. The pt completed the test with less than 3 errors in 4 mins and 4 secs. Pt additionally able to engage in therapeutic activity with pt playing card game to work on in hand  manipulation skills such as shifting and rotation. Pt completed card game with supervision. Pt additionally completed standing dynamic balance task playing Jenga with LUE, pt with impaired sensation in L hand having difficulty removing blocks from tower without knocking tower over. Pt transported back to room with total A, ambulatory transfer back to recliner with cane and CGA. Issued pt beads to put in theraputty to practice [facility]. Pt placing beads in putty once in recliner, eager to work on new HEP.                               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, mildly impulsive Restrictions Weight Bearing Restrictions: No   Pain: no pain reported during session     Therapy/Group: Individual Therapy  [name] 02/19/2021, 12:31 PM

## 2021-02-20 LAB — CBC WITH DIFFERENTIAL/PLATELET
Abs Immature Granulocytes: 0.09 10*3/uL — ABNORMAL HIGH (ref 0.00–0.07)
Basophils Absolute: 0 10*3/uL (ref 0.0–0.1)
Basophils Relative: 1 %
Eosinophils Absolute: 0.3 10*3/uL (ref 0.0–0.5)
Eosinophils Relative: 4 %
HCT: 32.6 % — ABNORMAL LOW (ref 36.0–46.0)
Hemoglobin: 10.6 g/dL — ABNORMAL LOW (ref 12.0–15.0)
Immature Granulocytes: 1 %
Lymphocytes Relative: 20 %
Lymphs Abs: 1.7 10*3/uL (ref 0.7–4.0)
MCH: 27.7 pg (ref 26.0–34.0)
MCHC: 32.5 g/dL (ref 30.0–36.0)
MCV: 85.1 fL (ref 80.0–100.0)
Monocytes Absolute: 0.5 10*3/uL (ref 0.1–1.0)
Monocytes Relative: 6 %
Neutro Abs: 6 10*3/uL (ref 1.7–7.7)
Neutrophils Relative %: 68 %
Platelets: 266 10*3/uL (ref 150–400)
RBC: 3.83 MIL/uL — ABNORMAL LOW (ref 3.87–5.11)
RDW: 14 % (ref 11.5–15.5)
WBC: 8.7 10*3/uL (ref 4.0–10.5)
nRBC: 0 % (ref 0.0–0.2)

## 2021-02-20 LAB — GLUCOSE, CAPILLARY
Glucose-Capillary: 110 mg/dL — ABNORMAL HIGH (ref 70–99)
Glucose-Capillary: 113 mg/dL — ABNORMAL HIGH (ref 70–99)
Glucose-Capillary: 104 mg/dL — ABNORMAL HIGH (ref 70–99)
Glucose-Capillary: 153 mg/dL — ABNORMAL HIGH (ref 70–99)

## 2021-02-20 LAB — COMPREHENSIVE METABOLIC PANEL
ALT: 15 U/L (ref 0–44)
AST: 17 U/L (ref 15–41)
Albumin: 3.2 g/dL — ABNORMAL LOW (ref 3.5–5.0)
Alkaline Phosphatase: 60 U/L (ref 38–126)
Anion gap: 6 (ref 5–15)
BUN: 15 mg/dL (ref 8–23)
CO2: 29 mmol/L (ref 22–32)
Calcium: 8.9 mg/dL (ref 8.9–10.3)
Chloride: 103 mmol/L (ref 98–111)
Creatinine, Ser: 1.13 mg/dL — ABNORMAL HIGH (ref 0.44–1.00)
GFR, Estimated: 53 mL/min — ABNORMAL LOW (ref >60)
Glucose, Bld: 113 mg/dL — ABNORMAL HIGH (ref 70–99)
Potassium: 4.8 mmol/L (ref 3.5–5.1)
Sodium: 138 mmol/L (ref 135–145)
Total Bilirubin: 0.3 mg/dL (ref 0.3–1.2)
Total Protein: 6.7 g/dL (ref 6.5–8.1)

## 2021-02-20 NOTE — Progress Notes (Signed)
Physical Therapy Discharge Summary  Patient Details  Name: Lindsay Rice MRN: [id number] Date of Birth: [birth year]  Today's Date: 02/20/2021 PT Individual Time: 0805-0916 PT Individual Time Calculation (min): 71 min    Patient has met 10 of 10 long term goals due to improved activity tolerance, improved balance, increased strength, ability to compensate for deficits, and improved coordination.  Patient to discharge at an ambulatory level Modified Independent.   Patient's care partner requires assistance to provide the necessary physical assistance at discharge. Pt to d/c at mod I ambulatory level. Pt requires assistance for uneven surfaces and stairs, pt expressed understanding. Pt has high level of mobility and is competent to receive education, so no family training provided.   Reasons goals not met: NA, pt surpassed gait goals  Recommendation:  Patient will benefit from ongoing skilled PT services in home health setting to continue to advance safe functional mobility, address ongoing impairments in balance, gait impairments, impulsivity, coordination, and minimize fall risk.  Equipment: RW for distance and uneven surfaces  Reasons for discharge: treatment goals met and discharge from hospital  Patient/family agrees with progress made and goals achieved: Yes  Skilled Therapeutic Interventions/Progress Updates:  Pt received in recliner and agreeable to therapy.  Pt reports unrated shoulder pain, premedicated. Rest and positioning provided as needed. Session focused on functional mobility and d/c assessments as noted below. Sit to stand mod I with hurry cane throughout session. Gait over 350 ft with hurrycane, mod I. Pt noted to have increased LLE hyperextension and decr stance time with fatigue. Provided education on continuing to work on gait impairments and utilize energy conservation techniques to manage gait impairments. Pt also expressed understanding of when to use RW vs cane. Pt  performed mod I car transfer without difficulty. Stair navigation 12 x 6" with 2 hand rails. X 2 steps with 1 handrail to  mimic home environment. Pt self selected alternating pattern midway through trial. Pt instructed in floor transfer with supervision, discussed calling for help and when to activate EMS. Required CGA when navigating uneven surface and first trial of curb. Min VC for safe curb transfer. MMT and Berg performed as documented below. Pt then returned to room and to recliner, was left with all needs in reach.   Patient demonstrates increased fall risk as noted by score of   47/56 on Berg Balance Scale.  (<36= high risk for falls, close to 100%; 37-45 significant >80%; 46-51 moderate >50%; 52-55 lower >78%) Discussed implications of results.   PT Discharge Precautions/Restrictions Precautions Precautions: Fall Precaution Comments: L hemipareisis UE>LE, mildly impulsive Restrictions Weight Bearing Restrictions: No Vital Signs Therapy Vitals Temp: 98.1 F (36.7 C) Temp Source: Oral Pulse Rate: 76 Resp: 18 BP: 125/61 Patient Position (if appropriate): Lying Oxygen Therapy SpO2: 94 % O2 Device: Room Air  Pain Interference Pain Interference Pain Effect on Sleep: 1. Rarely or not at all Pain Interference with Therapy Activities: 1. Rarely or not at all Pain Interference with Day-to-Day Activities: 1. Rarely or not at all Vision/Perception  Perception Perception: Within Functional Limits Praxis Praxis: Intact  Cognition Overall Cognitive Status: Within Functional Limits for tasks assessed Arousal/Alertness: Awake/alert Orientation Level: Oriented X4 Year: 2023 Month: January Day of Week: Correct Sustained Attention: Appears intact Memory: Appears intact Problem Solving: Appears intact Organizing: Appears intact Behaviors: Impulsive Safety/Judgment: Impaired Comments: impulsive during transfers Sensation Sensation Light Touch Impaired Details: Impaired  LUE Additional Comments: pt reports return of sensation Coordination Gross Motor Movements are Fluid and Coordinated:  No Coordination and Movement Description: mild L hemi but improved from eval Motor  Motor Motor: Ataxia;Hemiplegia Motor - Skilled Clinical Observations: mild L hemipareisis  Mobility Bed Mobility Bed Mobility: Supine to Sit;Sit to Supine Rolling Left: Independent with assistive device Supine to Sit: Independent with assistive device Sit to Supine: Independent with assistive device Sit to Sidelying Left: Independent with assistive device Transfers Transfers: Sit to Stand;Stand to Sit;Stand Pivot Transfers Sit to Stand: Independent with assistive device Stand to Sit: Independent with assistive device Stand Pivot Transfers: Independent with assistive device Stand Pivot Transfer Details: Verbal cues for precautions/safety Stand Pivot Transfer Details (indicate cue type and reason): VC to slow down Transfer (Assistive device): Straight cane Locomotion  Gait Ambulation: Yes Gait Assistance: Independent with assistive device Gait Distance (Feet): 300 Feet Assistive device: Straight cane (hurry cane) Gait Gait: Yes Gait Pattern: Impaired Gait Pattern: Left genu recurvatum;Step-through pattern;Decreased step length - left;Decreased stance time - right (worsens with fatigue, when fresh nearly normalized gait pattern very mild hemi gait) Gait velocity: decreased Stairs / Additional Locomotion Stairs: Yes Stairs Assistance: Supervision/Verbal cueing Stair Management Technique: Two rails;One rail Right Number of Stairs: 12 Height of Stairs: 6 Ramp: Independent with assistive device Curb: Supervision/Verbal cueing Wheelchair Mobility Wheelchair Mobility: No  Trunk/Postural Assessment  Cervical Assessment Cervical Assessment: Exceptions to [facility] Thoracic Assessment Thoracic Assessment: Exceptions to [facility] Lumbar Assessment Lumbar Assessment: Within Functional  Limits Postural Control Postural Control: Within Functional Limits  Balance Balance Balance Assessed: Yes Standardized Balance Assessment Standardized Balance Assessment: Berg Balance Test Berg Balance Test Sit to Stand: Able to stand without using hands and stabilize independently Standing Unsupported: Able to stand safely 2 minutes Sitting with Back Unsupported but Feet Supported on Floor or Stool: Able to sit safely and securely 2 minutes Stand to Sit: Sits safely with minimal use of hands Transfers: Able to transfer safely, minor use of hands Standing Unsupported with Eyes Closed: Able to stand 10 seconds safely Standing Ubsupported with Feet Together: Able to place feet together independently and stand for 1 minute with supervision From Standing, Reach Forward with Outstretched Arm: Can reach confidently >25 cm (10") From Standing Position, Pick up Object from Floor: Able to pick up shoe, needs supervision From Standing Position, Turn to Look Behind Over each Shoulder: Looks behind one side only/other side shows less weight shift Turn 360 Degrees: Able to turn 360 degrees safely but slowly Standing Unsupported, Alternately Place Feet on Step/Stool: Able to stand independently and complete 8 steps >20 seconds Standing Unsupported, One Foot in Front: Able to plae foot ahead of the other independently and hold 30 seconds Standing on One Leg: Able to lift leg independently and hold equal to or more than 3 seconds Total Score: 47 Static Sitting Balance Static Sitting - Balance Support: Feet supported Static Sitting - Level of Assistance: 7: Independent Dynamic Sitting Balance Dynamic Sitting - Balance Support: Feet supported;During functional activity Dynamic Sitting - Level of Assistance: 6: Modified independent (Device/Increase time) Static Standing Balance Static Standing - Balance Support: During functional activity Static Standing - Level of Assistance: 6: Modified independent  (Device/Increase time) Dynamic Standing Balance Dynamic Standing - Balance Support: During functional activity Dynamic Standing - Level of Assistance: 5: Stand by assistance Extremity Assessment      RLE Assessment RLE Assessment: Exceptions to [facility] RLE Strength RLE Overall Strength: Deficits Right Hip Flexion: 4+/5 Right Hip Extension: 4/5 Right Hip ABduction: 4+/5 Right Hip ADduction: 4/5 Right Knee Flexion: 4+/5 Right Knee Extension: 5/5 Right  Ankle Dorsiflexion: 4+/5 Right Ankle Plantar Flexion: 4/5 LLE Assessment LLE Assessment: Exceptions to [facility] LLE Strength LLE Overall Strength: Deficits Left Hip Flexion: 4-/5 Left Hip Extension: 3+/5 Left Knee Flexion: 4/5 Left Knee Extension: 4/5 Left Ankle Dorsiflexion: 5/5 Left Ankle Plantar Flexion: 5/5    Reichen Hutzler C Rehan Holness 02/20/2021, 9:06 AM

## 2021-02-20 NOTE — Progress Notes (Signed)
Occupational Therapy Session Note  Patient Details  Name: Lindsay Rice MRN: [id number] Date of Birth: [birth year]  Today's Date: 02/20/2021 OT Individual Time: 1350-1445 OT Individual Time Calculation (min): 55 min    Short Term Goals: Week 1:  OT Short Term Goal 1 (Week 1): Patient will complete toilet transfer with CGA OT Short Term Goal 2 (Week 1): Patient withh use L hand to brush hair for 50% of activity OT Short Term Goal 3 (Week 1): Patient will maintain standing balance at the sink with CGA in preparationf or BADL tasks OT Short Term Goal 4 (Week 1): Patient will recall hemi dressing techniuqes without cues  Skilled Therapeutic Interventions/Progress Updates:    Pt greeted in the recliner with no c/o pain, agreeable to shower. She used her cane to gather needed items at Mod I level. Doffed her clothing while sitting EOB before ambulating to the TTB. Pt then bathed at seated level without assistance, dressing completed in the same manner after thorough drying sit<stand from recliner. Discussed therapeutic activities that she can complete at home to further work on functional Lt hand skills, handout provided. Pt did well during ADL to integrate her Lt hand during bimanual tasks and even x1 when she dropped an item in the shower! Toilet transfer/toileting completed with Mod I. Pt also stood at the sink to blow-dry hair and complete oral care without LOBs. Provided Mod I sign for the door which brightened affect. She remained sitting up at close of session, all needs within reach.   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, mildly impulsive Restrictions Weight Bearing Restrictions: No Vital Signs: Therapy Vitals Temp: 97.8 F (36.6 C) Temp Source: Oral Pulse Rate: 96 Resp: 18 BP: (!) 146/66 Patient Position (if appropriate): Sitting Oxygen Therapy SpO2: 97 % O2 Device: Room Air ADL: ADL Eating:  Independent Grooming: Modified independent Upper Body Bathing: Setup Where Assessed-Upper Body Bathing: Shower Lower Body Bathing: Setup Where Assessed-Lower Body Bathing: Shower Upper Body Dressing: Modified independent (Device) Lower Body Dressing: Modified independent Toileting: Modified independent Toilet Transfer: Modified independent Tub/Shower Transfer: Close supervison Social research officer, government: Close supervision Social research officer, government Method: Heritage manager: Transfer tub bench (using cane) Praxis: Intact  Therapy/Group: Individual Therapy  Shania Bjelland A Garlin Batdorf 02/20/2021, 3:58 PM

## 2021-02-20 NOTE — Plan of Care (Signed)

## 2021-02-20 NOTE — Progress Notes (Signed)
PROGRESS NOTE   Subjective/Complaints:  Pt reports she ate breakfast-  LBM 2 days ago- no issues- got Ajovy for migraines.  D/c tomorrow.   ROS:   Pt denies SOB, abd pain, CP, N/V/C/D, and vision changes     Objective:   No results found. Recent Labs    02/20/21 0552  WBC 8.7  HGB 10.6*  HCT 32.6*  PLT 266    Recent Labs    02/20/21 0552  NA 138  K 4.8  CL 103  CO2 29  GLUCOSE 113*  BUN 15  CREATININE 1.13*  CALCIUM 8.9     Intake/Output Summary (Last 24 hours) at 02/20/2021 1127 Last data filed at 02/20/2021 0757 Gross per 24 hour  Intake 672 ml  Output --  Net 672 ml        Physical Exam: Vital Signs Blood pressure 125/61, pulse 76, temperature 98.1 F (36.7 C), temperature source Oral, resp. rate 18, height 5\' 5"  (1.651 m), weight 89.3 kg, SpO2 94 %.   General: awake, alert, appropriate, sitting up in bed; ready to get going per pt; NAD HENT: conjugate gaze; oropharynx moist CV: regular rate; no JVD Pulmonary: CTA B/L; no W/R/R- good air movement GI: soft, NT, ND, (+)BS Psychiatric: appropriate- interactive Neurological: Ox3;  Ext: no clubbing, cyanosis, or edema Psych: pleasant and cooperative  Skin: Clean and intact without signs of breakdown Neuro:  Alert and oriented x 3. Normal insight and awareness. Intact Memory. Normal language and speech. Cranial nerve exam unremarkable except for left central 7 and left perioral numbness. RUE and RLE 5/5. LUE 3+/5 prox to distal. LLE 4- prox to 4/5 distally. Decreased sensory loss along left hand. Mild L inattention improving.  Musculoskeletal: left shoulder + pain with IR and impingement maneuver   Assessment/Plan: 1. Functional deficits which require 3+ hours per day of interdisciplinary therapy in a comprehensive inpatient rehab setting. Physiatrist is providing close team supervision and 24 hour management of active medical problems listed  below. Physiatrist and rehab team continue to assess barriers to discharge/monitor patient progress toward functional and medical goals  Care Tool:  Bathing    Body parts bathed by patient: Right arm, Left arm, Chest, Abdomen, Front perineal area, Buttocks, Right upper leg, Left upper leg, Right lower leg, Left lower leg, Face         Bathing assist Assist Level: Supervision/Verbal cueing     Upper Body Dressing/Undressing Upper body dressing   What is the patient wearing?: Bra, Pull over shirt    Upper body assist Assist Level: Supervision/Verbal cueing    Lower Body Dressing/Undressing Lower body dressing      What is the patient wearing?: Pants, Underwear/pull up     Lower body assist Assist for lower body dressing: Contact Guard/Touching assist     Toileting Toileting    Toileting assist Assist for toileting: Supervision/Verbal cueing     Transfers Chair/bed transfer  Transfers assist     Chair/bed transfer assist level: Independent with assistive device Chair/bed transfer assistive device:   Ambulation assist      Assist level: Independent with assistive device Assistive device: Cane-straight Max distance: >  350 ft   Walk 10 feet activity   Assist     Assist level: Independent with assistive device Assistive device: Cane-straight   Walk 50 feet activity   Assist    Assist level: Independent with assistive device Assistive device: Cane-straight    Walk 150 feet activity   Assist Walk 150 feet activity did not occur: Safety/medical concerns  Assist level: Independent with assistive device Assistive device: Cane-straight    Walk 10 feet on uneven surface  activity   Assist Walk 10 feet on uneven surfaces activity did not occur: Safety/medical concerns   Assist level: Contact Guard/Touching assist Assistive device: Cane-straight   Wheelchair     Assist Is the patient using a wheelchair?:  No Type of Wheelchair: Manual    Wheelchair assist level: Total Assistance - Patient < 25% Max wheelchair distance: 300 ft    Wheelchair 50 feet with 2 turns activity    Assist        Assist Level: Total Assistance - Patient < 25%   Wheelchair 150 feet activity     Assist      Assist Level: Total Assistance - Patient < 25%   Blood pressure 125/61, pulse 76, temperature 98.1 F (36.7 C), temperature source Oral, resp. rate 18, height 5\' 5"  (1.651 m), weight 89.3 kg, SpO2 94 %.  Medical Problem List and Plan: 1.  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             -patient may shower             -ELOS/Goals: 02/21/21, mod I to supervision goals  Last day of therapy- d/c tomorrow 2.  Antithrombotics: -DVT/anticoagulation:  Pharmaceutical: Lovenox             -antiplatelet therapy: Aspirin 81 mg daily and Plavix 75 mg daily x3 weeks then Plavix alone 3. Pain Management/chronic right RTC injury: Elavil 25 mg nightly, Neurontin 300 mg twice daily, Fioricet as needed.  12/28 -discussed appropriate mechanics and engaging left arm more in functional activities   -apply voltaren gel to right shoulder TID  12/31- pain controlled today- continue regimen  1/2- Pain controlled- HA's a little better 4. Anxiety: continue BuSpar 5 mg twice daily, Zoloft 100 mg daily and 50 mg nightly             -moods seems up beat. Family appears supportive.   12/28 in good spirits             -antipsychotic agents: N/A 5. Neuropsych: This patient is capable of making decisions on her own behalf. 6. Skin/Wound Care: Routine skin checks 7. Fluids/Electrolytes/Nutrition: Routine in and outs with follow-up chemistries 8.  Permissive hypertension.  Currently on Norvasc 5 mg daily.  Patient also on HCTZ 25 mg daily, Cozaar 100 mg every morning, Lopressor 100 mg every morning and  50 mg nightly prior to admission.   -in "range" at present. Continue to follow12/29- BP controlled- con't regimen 12/30 BP very slightly elevated, but overall has been controlled- con't regimen 9.  History of migraine headaches.  Depakote 500 mg nightly,Ajovy 225 mg subcutaneous every 30 days  1/2- getting Ajovy- was due 10.  OSA.  Continue inhalers as directed 11.  Hypothyroidism.  Synthroid 12.  Diabetes mellitus.  Hemoglobin A1c 8.3.  Jardiance 25 mg daily.  Check blood sugars before meals and at bedtime  CBG (last 3)  Recent Labs    02/19/21 1615 02/19/21 2057 02/20/21 0625  GLUCAP 156* 166* 110*   12/24 -monitor with jardiance on board. Seems to need better control    -consider amaryl trial  12/28- CBGs 110s-200   -Amaryl increased to 2 mg daily effective today--observe  12/29- CBGs slightly better- just got dose increase yesterday- will monitor 1/2- CBGs looking much better- con't regimen 13.  Hyperlipidemia.  Lipitor 14.  Obesity.  BMI 33.31.  Dietary follow-up  15. AKI  12/26- Cr up to 1.26 from 0.93- BUN 20- so just shy of being dry per guidelines- will encourage fluids and recheck Wednesday. 12/28 Cr 0.94 today, likely just pre-renal bump  -continue to encourage fluids  1/2- Cr 1.13- wil have pt push fluids.  16. Migraines: Ajovy ordered   LOS: 10 days A FACE TO FACE EVALUATION WAS PERFORMED  Lindsay Rice 02/20/2021, 11:27 AM

## 2021-02-20 NOTE — Progress Notes (Addendum)
Inpatient Rehabilitation Discharge Medication Review by a Pharmacist  A complete drug regimen review was completed for this patient to identify any potential clinically significant medication issues.  High Risk Drug Classes Is patient taking? Indication by Medication  Antipsychotic No    Anticoagulant No   Antibiotic No    Opioid No    Antiplatelet Yes Plavix for Stroke  Hypoglycemics/insulin Yes Jardiance, Amaryl for DM  Vasoactive Medication Yes Amlodpine for BP  Chemotherapy No    Other Yes Lipitor for HLD Depakote, Elavil, Zoloft, Buspar for mood Synthroid for low thyroid Dulera for asthma Gabapentin, diclofenac for pain Diflucan for yeast infection     Type of Medication Issue Identified Description of Issue Recommendation(s)  Drug Interaction(s) (clinically significant)     Duplicate Therapy     Allergy     No Medication Administration End Date     Incorrect Dose     Additional Drug Therapy Needed     Significant med changes from prior encounter (inform family/care partners about these prior to discharge).    Other       Clinically significant medication issues were identified that warrant physician communication and completion of prescribed/recommended actions by midnight of the next day:  No  Pharmacist comments: None  Time spent performing this drug regimen review (minutes):  20 minutes   Tad Moore 02/20/2021 9:04 AM

## 2021-02-20 NOTE — Progress Notes (Signed)
Inpatient Rehabilitation Care Coordinator Discharge Note   Patient Details  Name: Lindsay Rice MRN: [id number] Date of Birth: Apr 10, 1950   Discharge location: HOME WITH HUSBAND WHO CAN BE THERE BUT NOT ASSIST  Length of Stay:  11 DAYS  Discharge activity level: SUPERVISION LEVEL  Home/community participation: ACTIVE  Patient response HU:TMLYYT Literacy - How often do you need to have someone help you when you read instructions, pamphlets, or other written material from your doctor or pharmacy?: Never  Patient response KP:TWSFKC Isolation - How often do you feel lonely or isolated from those around you?: Rarely  Services provided included: MD, RD, PT, OT, SLP, RN, CM, Pharmacy, SW  Financial Services:  Financial Services Utilized: Medicare    Choices offered to/list presented to: PT  Follow-up services arranged:  Home Health, DME, Patient/Family has no preference for HH/DME agencies Home Health Agency: [facility]-PT [facility]    DME : [facility]    Patient response to transportation need: Is the patient able to respond to transportation needs?: Yes In the past 12 months, has lack of transportation kept you from medical appointments or from getting medications?: No In the past 12 months, has lack of transportation kept you from meetings, work, or from getting things needed for daily living?: No    Comments (or additional information):PT DID WELL AND REACHED HER GOALS OF SUPERVISION. PT REPORTS SHE WORKED HARD AND FEELS READY TO GO HOME TOMORROW  Patient/Family verbalized understanding of follow-up arrangements:  Yes  Individual responsible for coordination of the follow-up plan: SELF & JAMES-HUSBAND [phone]  Confirmed correct DME delivered: [name] 02/20/2021    [name]

## 2021-02-20 NOTE — Progress Notes (Signed)
Patient ID: Lindsay Rice, female   DOB: [birth year], 71 y.o.   MRN: [id number]  Met with pt to ask again her preference regarding follow up therapies. She wants Home health due to can not drive and husband can not either. Have set up via [facility] and ready to provide service upon discharge.  Rolling walker in room, set for discharge tomorrow.

## 2021-02-21 LAB — GLUCOSE, CAPILLARY: Glucose-Capillary: 128 mg/dL — ABNORMAL HIGH (ref 70–99)

## 2021-02-21 MED ORDER — FAMOTIDINE 40 MG PO TABS: 40.0000 mg | ORAL_TABLET | Freq: Every day | ORAL | 0 refills | Status: AC

## 2021-02-21 MED ORDER — ASPIRIN 81 MG PO CHEW: 81.0000 mg | CHEWABLE_TABLET | Freq: Every day | ORAL | Status: DC

## 2021-02-21 MED ORDER — DICLOFENAC SODIUM 1 % EX GEL: 2.0000 g | Freq: Three times a day (TID) | CUTANEOUS | 0 refills | Status: DC

## 2021-02-21 MED ORDER — BLOOD GLUCOSE MONITOR KIT
PACK | 0 refills | Status: AC
Start: 2021-02-21 — End: ?

## 2021-02-21 MED ORDER — EMPAGLIFLOZIN 25 MG PO TABS: 25.0000 mg | ORAL_TABLET | Freq: Every day | ORAL | 1 refills | Status: DC

## 2021-02-21 MED ORDER — ATORVASTATIN CALCIUM 80 MG PO TABS: 80.0000 mg | ORAL_TABLET | Freq: Every day | ORAL | 0 refills | Status: DC

## 2021-02-21 MED ORDER — FLUCONAZOLE 150 MG PO TABS
150.0000 mg | ORAL_TABLET | Freq: Once | ORAL | Status: AC
  Administered 2021-02-21: 150 mg via ORAL
  Filled 2021-02-21: qty 1

## 2021-02-21 MED ORDER — CLOPIDOGREL BISULFATE 75 MG PO TABS
75.0000 mg | ORAL_TABLET | Freq: Every day | ORAL | 0 refills | Status: AC
Start: 2021-02-21 — End: ?

## 2021-02-21 MED ORDER — GLIMEPIRIDE 2 MG PO TABS: 2.0000 mg | ORAL_TABLET | Freq: Every day | ORAL | 0 refills | Status: DC

## 2021-02-21 MED ORDER — FLUCONAZOLE 100 MG PO TABS: 100.0000 mg | ORAL_TABLET | Freq: Once | ORAL | 0 refills | Status: AC

## 2021-02-21 MED ORDER — AMLODIPINE BESYLATE 5 MG PO TABS: 5.0000 mg | ORAL_TABLET | Freq: Every day | ORAL | 0 refills | Status: DC

## 2021-02-21 NOTE — Plan of Care (Signed)
Patient discharged at this time in care of family. All goals met.

## 2021-02-21 NOTE — Discharge Summary (Signed)
Physician Discharge Summary  Patient ID: Lindsay Rice MRN: [id number] DOB/AGE: 26-May-1950 71 y.o.  Admit date: 02/10/2021 Discharge date: 02/21/2021  Discharge Diagnoses:  Principal Problem:   Infarction of right basal ganglia ([place]) DVT prophylaxis Chronic right rotator cuff injury Mood stabilization Permissive hypertension History of migraine headaches OSA Hypothyroidism Diabetes mellitus Hyperlipidemia Obesity AKI  Discharged Condition: Stable  Significant Diagnostic Studies: CT HEAD WO CONTRAST (5MM)  Result Date: 02/08/2021 CLINICAL DATA:  Neuro deficit, stroke suspected. EXAM: CT HEAD WITHOUT CONTRAST TECHNIQUE: Contiguous axial images were obtained from the base of the skull through the vertex without intravenous contrast. COMPARISON:  Plain brain CT and MR head, dated February 07, 2021 FINDINGS: Brain: There is mild cerebral atrophy with widening of the extra-axial spaces and ventricular dilatation. There are areas of decreased attenuation within the white matter tracts of the supratentorial brain, consistent with microvascular disease changes. A an ill-defined, approximately 8 mm diameter area of white matter low attenuation is seen along the para thalamic region on the right. This is slightly more prominent on the current exam when compared to the prior study and corresponds to findings seen on the prior MRI. There is no evidence of associated mass effect or midline shift. Vascular: No hyperdense vessel or unexpected calcification. Skull: Normal. Negative for fracture or focal lesion. Sinuses/Orbits: No acute finding. Other: None. IMPRESSION: Small, ill-defined subacute infarct along the para thalamic region on the right. This is slightly more prominent on the current exam when compared to the prior study and corresponds to findings seen on the prior MRI. Electronically Signed   By: Virgina Norfolk M.D.   On: 02/08/2021 21:13   MR ANGIO HEAD WO CONTRAST  Result Date:  02/07/2021 CLINICAL DATA:  Initial evaluation for neuro deficit, stroke suspected. EXAM: MRI HEAD WITHOUT CONTRAST MRA HEAD WITHOUT CONTRAST MRA NECK WITHOUT AND WITH CONTRAST TECHNIQUE: Multiplanar, multi-echo pulse sequences of the brain and surrounding structures were acquired without intravenous contrast. Angiographic images of the Circle of Willis were acquired using MRA technique without intravenous contrast. Angiographic images of the neck were acquired using MRA technique without and with intravenous contrast. Carotid stenosis measurements (when applicable) are obtained utilizing NASCET criteria, using the distal internal carotid diameter as the denominator. CONTRAST:  52m GADAVIST GADOBUTROL 1 MMOL/ML IV SOLN COMPARISON:  Head CT from earlier the same day. FINDINGS: MRI HEAD FINDINGS Brain: Cerebral volume within normal limits. Minimal chronic microvascular ischemic disease noted involving the pons. 7 mm focus of restricted diffusion seen involving the posterior limb of the right internal capsule, consistent with an acute ischemic infarct (series 5, image 75). No associated hemorrhage or mass effect. No other evidence for acute or subacute ischemia. No other areas of chronic cortical infarction. No evidence for acute or chronic intracranial hemorrhage. No mass lesion or midline shift. No hydrocephalus or extra-axial fluid collection. Pituitary gland suprasellar region normal. Vascular: Major intracranial vascular flow voids are well maintained. Skull and upper cervical spine: Craniocervical junction within normal limits. Bone marrow signal intensity normal. No scalp soft tissue abnormality. Sinuses/Orbits: Patient status post bilateral ocular lens replacement. Paranasal sinuses are largely clear. No significant mastoid effusion. Inner ear structures grossly normal. Other: None. MRA HEAD FINDINGS Anterior circulation: Examination mildly degraded by motion. Visualized distal cervical segments of the internal  carotid arteries are patent with antegrade flow. Petrous, cavernous, and supraclinoid segments patent without stenosis or other abnormality. Eight A1 segments widely patent. Normal anterior communicating artery complex. Anterior cerebral arteries patent without stenosis. No  M1 stenosis or occlusion. Normal MCA bifurcations. Distal MCA branches perfused and symmetric. Posterior circulation: Both V4 segments patent without stenosis. Right vertebral artery dominant. Both PICA patent at their origins. Basilar widely patent to its distal aspect. Superior cerebellar and posterior cerebral arteries widely patent bilaterally. Anatomic variants: None significant.  No aneurysm. MRA NECK FINDINGS Aortic arch: Examination degraded by motion artifact. Visualized aortic arch normal caliber with normal branch pattern. No hemodynamically significant stenosis seen about the origin the great vessels. Right carotid system: Right common and internal carotid arteries widely patent without stenosis, evidence for dissection or occlusion. Left carotid system: Left common and internal carotid arteries widely patent without stenosis, evidence for dissection or occlusion. Vertebral arteries: Both vertebral arteries arise from the subclavian arteries. No proximal subclavian artery stenosis. Both vertebral arteries widely patent without stenosis, evidence for dissection or occlusion. Right vertebral artery dominant. Other: None IMPRESSION: MRI HEAD IMPRESSION: 1. 7 mm acute ischemic nonhemorrhagic infarct involving the posterior limb of the right internal capsule. 2. 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 MRA HEAD IMPRESSION: Normal intracranial MRA. No large vessel occlusion, hemodynamically significant stenosis, or other acute vascular abnormality. MRA NECK IMPRESSION: Normal MRA of the neck. Electronically Signed   By: Jeannine Boga M.D.   On: 02/07/2021 23:21   MR ANGIO NECK W WO CONTRAST  Result Date:  02/07/2021 CLINICAL DATA:  Initial evaluation for neuro deficit, stroke suspected. EXAM: MRI HEAD WITHOUT CONTRAST MRA HEAD WITHOUT CONTRAST MRA NECK WITHOUT AND WITH CONTRAST TECHNIQUE: Multiplanar, multi-echo pulse sequences of the brain and surrounding structures were acquired without intravenous contrast. Angiographic images of the Circle of Willis were acquired using MRA technique without intravenous contrast. Angiographic images of the neck were acquired using MRA technique without and with intravenous contrast. Carotid stenosis measurements (when applicable) are obtained utilizing NASCET criteria, using the distal internal carotid diameter as the denominator. CONTRAST:  9m GADAVIST GADOBUTROL 1 MMOL/ML IV SOLN COMPARISON:  Head CT from earlier the same day. FINDINGS: MRI HEAD FINDINGS Brain: Cerebral volume within normal limits. Minimal chronic microvascular ischemic disease noted involving the pons. 7 mm focus of restricted diffusion seen involving the posterior limb of the right internal capsule, consistent with an acute ischemic infarct (series 5, image 75). No associated hemorrhage or mass effect. No other evidence for acute or subacute ischemia. No other areas of chronic cortical infarction. No evidence for acute or chronic intracranial hemorrhage. No mass lesion or midline shift. No hydrocephalus or extra-axial fluid collection. Pituitary gland suprasellar region normal. Vascular: Major intracranial vascular flow voids are well maintained. Skull and upper cervical spine: Craniocervical junction within normal limits. Bone marrow signal intensity normal. No scalp soft tissue abnormality. Sinuses/Orbits: Patient status post bilateral ocular lens replacement. Paranasal sinuses are largely clear. No significant mastoid effusion. Inner ear structures grossly normal. Other: None. MRA HEAD FINDINGS Anterior circulation: Examination mildly degraded by motion. Visualized distal cervical segments of the internal  carotid arteries are patent with antegrade flow. Petrous, cavernous, and supraclinoid segments patent without stenosis or other abnormality. Eight A1 segments widely patent. Normal anterior communicating artery complex. Anterior cerebral arteries patent without stenosis. No M1 stenosis or occlusion. Normal MCA bifurcations. Distal MCA branches perfused and symmetric. Posterior circulation: Both V4 segments patent without stenosis. Right vertebral artery dominant. Both PICA patent at their origins. Basilar widely patent to its distal aspect. Superior cerebellar and posterior cerebral arteries widely patent bilaterally. Anatomic variants: None significant.  No aneurysm. MRA NECK FINDINGS Aortic arch: Examination  degraded by motion artifact. Visualized aortic arch normal caliber with normal branch pattern. No hemodynamically significant stenosis seen about the origin the great vessels. Right carotid system: Right common and internal carotid arteries widely patent without stenosis, evidence for dissection or occlusion. Left carotid system: Left common and internal carotid arteries widely patent without stenosis, evidence for dissection or occlusion. Vertebral arteries: Both vertebral arteries arise from the subclavian arteries. No proximal subclavian artery stenosis. Both vertebral arteries widely patent without stenosis, evidence for dissection or occlusion. Right vertebral artery dominant. Other: None IMPRESSION: MRI HEAD IMPRESSION: 1. 7 mm acute ischemic nonhemorrhagic infarct involving the posterior limb of the right internal capsule. 2. 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 MRA HEAD IMPRESSION: Normal intracranial MRA. No large vessel occlusion, hemodynamically significant stenosis, or other acute vascular abnormality. MRA NECK IMPRESSION: Normal MRA of the neck. Electronically Signed   By: Jeannine Boga M.D.   On: 02/07/2021 23:21   MR BRAIN WO CONTRAST  Result Date:  02/07/2021 CLINICAL DATA:  Initial evaluation for neuro deficit, stroke suspected. EXAM: MRI HEAD WITHOUT CONTRAST MRA HEAD WITHOUT CONTRAST MRA NECK WITHOUT AND WITH CONTRAST TECHNIQUE: Multiplanar, multi-echo pulse sequences of the brain and surrounding structures were acquired without intravenous contrast. Angiographic images of the Circle of Willis were acquired using MRA technique without intravenous contrast. Angiographic images of the neck were acquired using MRA technique without and with intravenous contrast. Carotid stenosis measurements (when applicable) are obtained utilizing NASCET criteria, using the distal internal carotid diameter as the denominator. CONTRAST:  74m GADAVIST GADOBUTROL 1 MMOL/ML IV SOLN COMPARISON:  Head CT from earlier the same day. FINDINGS: MRI HEAD FINDINGS Brain: Cerebral volume within normal limits. Minimal chronic microvascular ischemic disease noted involving the pons. 7 mm focus of restricted diffusion seen involving the posterior limb of the right internal capsule, consistent with an acute ischemic infarct (series 5, image 75). No associated hemorrhage or mass effect. No other evidence for acute or subacute ischemia. No other areas of chronic cortical infarction. No evidence for acute or chronic intracranial hemorrhage. No mass lesion or midline shift. No hydrocephalus or extra-axial fluid collection. Pituitary gland suprasellar region normal. Vascular: Major intracranial vascular flow voids are well maintained. Skull and upper cervical spine: Craniocervical junction within normal limits. Bone marrow signal intensity normal. No scalp soft tissue abnormality. Sinuses/Orbits: Patient status post bilateral ocular lens replacement. Paranasal sinuses are largely clear. No significant mastoid effusion. Inner ear structures grossly normal. Other: None. MRA HEAD FINDINGS Anterior circulation: Examination mildly degraded by motion. Visualized distal cervical segments of the internal  carotid arteries are patent with antegrade flow. Petrous, cavernous, and supraclinoid segments patent without stenosis or other abnormality. Eight A1 segments widely patent. Normal anterior communicating artery complex. Anterior cerebral arteries patent without stenosis. No M1 stenosis or occlusion. Normal MCA bifurcations. Distal MCA branches perfused and symmetric. Posterior circulation: Both V4 segments patent without stenosis. Right vertebral artery dominant. Both PICA patent at their origins. Basilar widely patent to its distal aspect. Superior cerebellar and posterior cerebral arteries widely patent bilaterally. Anatomic variants: None significant.  No aneurysm. MRA NECK FINDINGS Aortic arch: Examination degraded by motion artifact. Visualized aortic arch normal caliber with normal branch pattern. No hemodynamically significant stenosis seen about the origin the great vessels. Right carotid system: Right common and internal carotid arteries widely patent without stenosis, evidence for dissection or occlusion. Left carotid system: Left common and internal carotid arteries widely patent without stenosis, evidence for dissection or occlusion. Vertebral arteries:  Both vertebral arteries arise from the subclavian arteries. No proximal subclavian artery stenosis. Both vertebral arteries widely patent without stenosis, evidence for dissection or occlusion. Right vertebral artery dominant. Other: None IMPRESSION: MRI HEAD IMPRESSION: 1. 7 mm acute ischemic nonhemorrhagic infarct involving the posterior limb of the right internal capsule. 2. 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 MRA HEAD IMPRESSION: Normal intracranial MRA. No large vessel occlusion, hemodynamically significant stenosis, or other acute vascular abnormality. MRA NECK IMPRESSION: Normal MRA of the neck. Electronically Signed   By: Jeannine Boga M.D.   On: 02/07/2021 23:21   ECHOCARDIOGRAM COMPLETE  Result Date:  02/08/2021    ECHOCARDIOGRAM REPORT   Patient Name:   Lindsay Rice Date of Exam: 02/08/2021 Medical Rec #:  [id number]      Height:       65.0 in Accession #:    [id number]     Weight:       200.2 lb Date of Birth:  [birth year]       BSA:          1.979 m Patient Age:    81 years       BP:           137/70 mmHg Patient Gender: F              HR:           95 bpm. Exam Location:  Inpatient Procedure: 2D Echo, Cardiac Doppler and Color Doppler Indications:    TIA  History:        Patient has no prior history of Echocardiogram examinations.  Sonographer:    Merrie Roof RDCS Referring Phys: [id number] [place]  1. Left ventricular ejection fraction, by estimation, is 65 to 70%. The left ventricle has normal function. The left ventricle has no regional wall motion abnormalities. Left ventricular diastolic parameters are consistent with Grade I diastolic dysfunction (impaired relaxation).  2. Right ventricular systolic function is normal. The right ventricular size is normal.  3. The mitral valve is normal in structure. No evidence of mitral valve regurgitation. No evidence of mitral stenosis. Moderate to severe mitral annular calcification.  4. The aortic valve is tricuspid. There is mild thickening of the aortic valve. Aortic valve regurgitation is not visualized. Aortic valve sclerosis is present, with no evidence of aortic valve stenosis.  5. The inferior vena cava is normal in size with greater than 50% respiratory variability, suggesting right atrial pressure of 3 mmHg. FINDINGS  Left Ventricle: Left ventricular ejection fraction, by estimation, is 65 to 70%. The left ventricle has normal function. The left ventricle has no regional wall motion abnormalities. The left ventricular internal cavity size was normal in size. There is  no left ventricular hypertrophy. Left ventricular diastolic parameters are consistent with Grade I diastolic dysfunction (impaired relaxation). Normal left ventricular  filling pressure. Right Ventricle: The right ventricular size is normal. No increase in right ventricular wall thickness. Right ventricular systolic function is normal. Left Atrium: Left atrial size was normal in size. Right Atrium: Right atrial size was normal in size. Pericardium: There is no evidence of pericardial effusion. Mitral Valve: The mitral valve is normal in structure. Moderate to severe mitral annular calcification. No evidence of mitral valve regurgitation. No evidence of mitral valve stenosis. Tricuspid Valve: The tricuspid valve is normal in structure. Tricuspid valve regurgitation is not demonstrated. No evidence of tricuspid stenosis. Aortic Valve: The aortic valve is tricuspid. There is mild thickening of the aortic  valve. Aortic valve regurgitation is not visualized. Aortic valve sclerosis is present, with no evidence of aortic valve stenosis. Aortic valve mean gradient measures 5.0  mmHg. Aortic valve peak gradient measures 8.4 mmHg. Aortic valve area, by VTI measures 2.61 cm. Pulmonic Valve: The pulmonic valve was normal in structure. Pulmonic valve regurgitation is not visualized. No evidence of pulmonic stenosis. Aorta: The aortic root is normal in size and structure. Venous: The inferior vena cava was not well visualized. The inferior vena cava is normal in size with greater than 50% respiratory variability, suggesting right atrial pressure of 3 mmHg. IAS/Shunts: No atrial level shunt detected by color flow Doppler.  LEFT VENTRICLE PLAX 2D LVIDd:         3.50 cm   Diastology LVIDs:         2.40 cm   LV e' medial:    6.20 cm/s LV PW:         1.00 cm   LV E/e' medial:  7.7 LV IVS:        1.00 cm   LV e' lateral:   7.51 cm/s LVOT diam:     2.00 cm   LV E/e' lateral: 6.3 LV SV:         63 LV SV Index:   32 LVOT Area:     3.14 cm  LEFT ATRIUM           Index        RIGHT ATRIUM           Index LA diam:      3.70 cm 1.87 cm/m   RA Area:     12.30 cm LA Vol (A2C): 34.1 ml 17.23 ml/m  RA  Volume:   27.00 ml  13.64 ml/m LA Vol (A4C): 48.8 ml 24.66 ml/m  AORTIC VALVE AV Area (Vmax):    2.60 cm AV Area (Vmean):   2.64 cm AV Area (VTI):     2.61 cm AV Vmax:           145.00 cm/s AV Vmean:          97.700 cm/s AV VTI:            0.242 m AV Peak Grad:      8.4 mmHg AV Mean Grad:      5.0 mmHg LVOT Vmax:         120.00 cm/s LVOT Vmean:        82.000 cm/s LVOT VTI:          0.201 m LVOT/AV VTI ratio: 0.83  AORTA Ao Root diam: 2.90 cm Ao Asc diam:  2.80 cm MITRAL VALVE MV Area (PHT): 4.17 cm    SHUNTS MV Decel Time: 182 msec    Systemic VTI:  0.20 m MV E velocity: 47.67 cm/s  Systemic Diam: 2.00 cm MV A velocity: 99.40 cm/s MV E/A ratio:  0.48 Mihai Croitoru MD Electronically signed by Sanda Klein MD Signature Date/Time: 02/08/2021/2:53:21 PM    Final    CT HEAD CODE STROKE WO CONTRAST  Result Date: 02/07/2021 CLINICAL DATA:  Code stroke. Initial evaluation for acute stroke. Right-sided facial droop. EXAM: CT HEAD WITHOUT CONTRAST TECHNIQUE: Contiguous axial images were obtained from the base of the skull through the vertex without intravenous contrast. COMPARISON:  CT from 04/22/2015. FINDINGS: Brain: Cerebral volume within normal limits. No acute intracranial hemorrhage. No acute large vessel territory infarct. No mass lesion, midline shift or mass effect. No hydrocephalus or extra-axial fluid collection. Vascular: No  hyperdense vessel. Skull: Scalp soft tissues and calvarium within normal limits. Sinuses/Orbits: Globes and orbital soft tissues within normal limits. Paranasal sinuses and mastoid air cells are clear. Other: None. ASPECTS [facility] Stroke Program Early CT Score) - Ganglionic level infarction (caudate, lentiform nuclei, internal capsule, insula, M1-M3 cortex): 7 - Supraganglionic infarction (M4-M6 cortex): 3 Total score (0-10 with 10 being normal): 10 IMPRESSION: 1. No acute intracranial abnormality. 2. ASPECTS is 10. Results were communicated by telephone at the time of  interpretation on 02/07/2021 at 8:22 pm to provider Dr. Lorrin Goodell by Dr. Lovenia Kim. Electronically Signed   By: Jeannine Boga M.D.   On: 02/07/2021 20:26    Labs:  Basic Metabolic Panel: Recent Labs  Lab 02/15/21 0548 02/20/21 0552  NA 142 138  K 4.0 4.8  CL 103 103  CO2 29 29  GLUCOSE 131* 113*  BUN 13 15  CREATININE 0.94 1.13*  CALCIUM 8.7* 8.9    CBC: Recent Labs  Lab 02/20/21 0552  WBC 8.7  NEUTROABS 6.0  HGB 10.6*  HCT 32.6*  MCV 85.1  PLT 266    CBG: Recent Labs  Lab 02/20/21 0625 02/20/21 1123 02/20/21 1615 02/20/21 2100 02/21/21 0552  GLUCAP 110* 113* 104* 153* 128*   Family history.  Positive for hypertension as well as hyperlipidemia.  Denies any colon cancer esophageal cancer or rectal cancer  Brief HPI:   Lindsay Rice is a 71 y.o. right-handed female with history of migraine headaches maintained on ajovy subcutaneous every 30 days as well as Depakote, diabetes mellitus obesity with BMI 33.31 hypertension hyperlipidemia mitral valve prolapse and OSA.  Per chart review lives with spouse independent prior to admission.  Presented 02/07/2021 with acute onset of left-sided weakness while getting out of her car resulting in a fall.  She was able to get into her home and went to the bathroom and fell again without loss of consciousness.  CT/MRI showed a 7 mm acute ischemic nonhemorrhagic infarct involving the posterior limb of right internal capsule.  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  MRA of the head unremarkable no large vessel occlusion.  MRA of the neck negative without dissection or occlusion.  Patient did receiveTNKase.  Admission chemistries unremarkable except glucose 373 creatinine 1.01 hemoglobin A1c 8.3.  Echocardiogram with ejection fraction of 65 to 70% no wall motion abnormalities grade 1 diastolic dysfunction.  Neurology follow-up maintained on aspirin 81 mg daily and Plavix 75 mg day x3 weeks followed by Plavix  alone.  Subcutaneous Lovenox for DVT prophylaxis.  Therapy evaluations completed due to patient's decreased functional mobility left side weakness was admitted for a comprehensive rehab program.   Hospital Course: Lindsay Rice was admitted to rehab 02/10/2021 for inpatient therapies to consist of PT, ST and OT at least three hours five days a week. Past admission physiatrist, therapy team and rehab RN have worked together to provide customized collaborative inpatient rehab.  Pertain to patient's right basal ganglia infarction of the posterior limb right internal capsule remained stable she would follow neurology services.  Aspirin and Plavix x3 weeks then Plavix alone.  Patient had been on Lovenox for DVT prophylaxis.  Hospital course chronic right rotator cuff injury Elavil nightly as well as Neurontin as advised Fioricet as needed.  Mood stabilization remained on BuSpar as well as Zoloft emotional support provided.  Permissive hypertension maintained on amlodipine and would need outpatient follow-up with PCP.  Hemoglobin A1c 8.3 patient remained on Jardiance as well as Amaryl.  Hypothyroidism with hormone supplement as directed.  Noted OSA with oxygen saturations maintained albuterol inhaler as needed as well as  Advair.  She did have a history of migraine headaches she remained on Ajovy as directed every 30 days.  Lipitor for hyperlipidemia.  Obesity BMI 33.31 dietary follow-up.  AKI latest creatinine 1.13 and monitored with encouragement of fluids.   Blood pressures were monitored on TID basis and soft and monitored  Diabetes has been monitored with ac/hs CBG checks and SSI was use prn for tighter BS control.    Rehab course: During patient's stay in rehab weekly team conferences were held to monitor patient's progress, set goals and discuss barriers to discharge. At admission, patient required moderate assist 5 feet rolling walker minimal guard sit to supine  Physical exam.  Blood pressure 151/82  pulse 106 temperature 98.3 respirations 18 oxygen saturations 95% room air Constitutional.  No acute distress HEENT Head.  Normocephalic and atraumatic Eyes.  Pupils round and reactive to light no discharge without nystagmus Neck.  Supple nontender no JVD without thyromegaly Cardiac regular rate rhythm and extra sounds or murmur heard Abdomen.  Soft nontender positive bowel sounds without rebound Respiratory effort normal no respiratory distress without wheeze Skin.  Warm and dry Musculoskeletal.  Normal range of motion Neurologic.  Alert oriented normal insight and awareness.  Intact memory.  Normal language and speech.  Cranial nerve exam unremarkable except for left central 7 and left perioral sensory loss.  Right upper extremity 5/5.  Right lower extremity 5/5.  Left upper extremity 2+ to 3 -/5 proximal to 3/5 distally.  Left lower extremity 4 - proximal to 4/5 distally.  He/She  has had improvement in activity tolerance, balance, postural control as well as ability to compensate for deficits. He/She has had improvement in functional use RUE/LUE  and RLE/LLE as well as improvement in awareness.  Working with improved activity tolerance improved balance increase strength ability to compensate for deficits.  Patient discharged at a modified independent ambulatory level.  Ambulates 350 feet.  Stair negotiation contact-guard.  Set up for upper and lower body bathing modified independent upper and lower body dressing.  Full family teaching completed plan discharged to home       Disposition: Discharged to home   Diet: Diabetic diet  Special Instructions: No driving smoking or alcohol  Medications at discharge 1.Ajovy 1.5 mL subcutaneous every 30 days 2.  Albuterol inhaler 2 puffs every 6 hours as needed shortness of breath 3.  Elavil 25 mg p.o. nightly 4.  Amlodipine 5 mg p.o. daily 5.  Aspirin 81 mg daily until March 03, 2021 and stop 6.  Lipitor 80 mg p.o. nightly 7.  BuSpar 5 mg  p.o. every morning and nightly 8.  Fioricet 1 tab in the morning and 1 at bedtime 9.  Plavix 75 mg daily 10.  Voltaren gel 2 g 3 times daily to right shoulder 11.  Depakote 500 mg p.o. nightly 12.  Jardiance 25 mg p.o. daily 13.  Pepcid 40 mg p.o. nightly 14.  Advair inhaler 2 puffs twice daily as needed 15.  Neurontin 300 mg every morning and nightly 16.  Amaryl 2 mg daily with breakfast 17.  Synthroid 75 mcg p.o. daily 18.  Zofran 4 mg every 8 hours as needed nausea 19.  Zoloft 100 mg every morning 50 mg nightly 20.  Triamcinolone cream twice daily as needed to affected areas  30-35 minutes were spent completing discharge summary and discharge planning  Discharge Instructions     Ambulatory referral to Neurology   Complete by: As directed    An appointment is requested in approximately: 4 weeks right basal ganglia infarction      Allergies as of 02/21/2021       Reactions   Iodinated Contrast Media Hives, Shortness Of Breath, Rash, Other (See Comments)   Required an Epi-pen   Iodine Hives, Shortness Of Breath, Rash   Shellfish-derived Products Hives, Shortness Of Breath   Cefaclor Rash   Cheese Other (See Comments)   Headaches/migraines from Swiss and Parmesan only   Covid-19 (mrna) Vaccine Nausea And Vomiting, Other (See Comments)   Severe Weakness, also   Levofloxacin Hives, Rash   Penicillins Hives, Rash   Wound Dressing Adhesive Other (See Comments)   Paper Tape ok        Medication List     STOP taking these medications    acetaminophen 325 MG tablet Commonly known as: TYLENOL   hydrochlorothiazide 25 MG tablet Commonly known as: HYDRODIURIL   losartan 100 MG tablet Commonly known as: COZAAR   metoprolol tartrate 100 MG tablet Commonly known as: LOPRESSOR   montelukast 10 MG tablet Commonly known as: SINGULAIR   pantoprazole 40 MG tablet Commonly known as: PROTONIX       TAKE these medications    Ajovy 225 MG/1.5ML Sosy Generic drug:  Fremanezumab-vfrm Inject 225 mg into the skin every 30 (thirty) days.   albuterol 108 (90 Base) MCG/ACT inhaler Commonly known as: VENTOLIN HFA Inhale 2 puffs into the lungs every 6 (six) hours as needed for wheezing or shortness of breath.   amitriptyline 25 MG tablet Commonly known as: ELAVIL Take 25 mg by mouth at bedtime.   amLODipine 5 MG tablet Commonly known as: NORVASC Take 1 tablet (5 mg total) by mouth daily.   aspirin 81 MG chewable tablet Chew 1 tablet (81 mg total) by mouth daily. Till 03/03/21 then stop taking it. What changed: additional instructions Notes to patient: STOP TAKING AFTER JAN 13TH   atorvastatin 80 MG tablet Commonly known as: LIPITOR Take 1 tablet (80 mg total) by mouth at bedtime.   blood glucose meter kit and supplies Kit Dispense based on patient and insurance preference. Use up to four times daily as directed. Notes to patient: CHECK BEFORE BREAKFAST/SUPPER ONE DAY AND ALTERNATE WITH BEFORE LUNCH/BEDTME.    busPIRone 5 MG tablet Commonly known as: BUSPAR Take 5 mg by mouth in the morning and at bedtime.   butalbital-acetaminophen-caffeine 50-325-40 MG tablet Commonly known as: FIORICET Take 1 tablet by mouth in the morning and at bedtime.   clopidogrel 75 MG tablet Commonly known as: PLAVIX Take 1 tablet (75 mg total) by mouth daily.   diclofenac Sodium 1 % Gel Commonly known as: VOLTAREN Apply 2 g topically 3 (three) times daily. To right shoulder   divalproex 500 MG 24 hr tablet Commonly known as: DEPAKOTE ER Take 500 mg by mouth at bedtime.   empagliflozin 25 MG Tabs tablet Commonly known as: JARDIANCE Take 1 tablet (25 mg total) by mouth daily.   EPINEPHrine 0.3 mg/0.3 mL Soaj injection Commonly known as: EPI-PEN Inject 0.3 mg into the muscle once as needed for anaphylaxis.   famotidine 40 MG tablet Commonly known as: PEPCID Take 1 tablet (40 mg total) by mouth at bedtime.   fluconazole 100 MG tablet Commonly known as:  Diflucan Take 1 tablet (100 mg total) by mouth once for 1 dose. Start taking on: February 24, 2021   fluticasone-salmeterol 115-21 MCG/ACT inhaler Commonly known as: ADVAIR HFA Inhale 2 puffs into the lungs 2 (two) times daily as needed (for cold-induced asthma symptoms).   gabapentin 300 MG capsule Commonly known as: NEURONTIN Take 300 mg by mouth in the morning and at bedtime.   glimepiride 2 MG tablet Commonly known as: AMARYL Take 1 tablet (2 mg total) by mouth daily with breakfast.   levothyroxine 75 MCG tablet Commonly known as: SYNTHROID Take 75 mcg by mouth daily before breakfast.   ondansetron 4 MG disintegrating tablet Commonly known as: ZOFRAN-ODT Take 4 mg by mouth every 8 (eight) hours as needed for nausea or vomiting (dissolve orally).   promethazine 25 MG suppository Commonly known as: PHENERGAN Place 25 mg rectally every 6 (six) hours as needed for nausea or vomiting.   senna-docusate 8.6-50 MG tablet Commonly known as: Senokot-S Take 1 tablet by mouth at bedtime as needed for moderate constipation or mild constipation.   sertraline 100 MG tablet Commonly known as: ZOLOFT Take 50-100 mg by mouth See admin instructions. Take 100 mg by mouth in the morning 50 mg at bedtime   triamcinolone cream 0.1 % Commonly known as: KENALOG Apply 1 application topically 2 (two) times daily as needed (to affected areas).        Follow-up Information     Lovorn, Jinny Blossom, MD Follow up.   Specialty: Physical Medicine and Rehabilitation Why: Office to call for appointment Contact information: [address] [place] [address] [phone]         [facility]. Call.   Specialty: Neurology Why: for post stroke follow up Contact information: [address] [place], [place], [place]. Call.   Specialty: Physician Assistant Why: for post hospital follow up Contact  information: [place] [address] [phone]                 Signed: Lavon Paganini [place] 02/21/2021, 11:43 AM

## 2021-02-21 NOTE — Discharge Summary (Incomplete)
Physician Discharge Summary  Patient ID: Lindsay Rice MRN: [id number] DOB/AGE: [birth year] 70 y.o.  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 EXAM: CT HEAD WITHOUT CONTRAST TECHNIQUE: Contiguous axial images were obtained from the base of the skull through the vertex without intravenous contrast. COMPARISON:  Plain brain CT and MR head, dated February 07, 2021 FINDINGS: Brain: There is mild cerebral atrophy with widening of the extra-axial spaces and ventricular dilatation. There are areas of decreased attenuation within the white matter tracts of the supratentorial brain, consistent with microvascular disease changes. A an ill-defined, approximately 8 mm diameter area of white matter low attenuation is seen along the para thalamic region on the right. This is slightly more prominent on the current exam when compared to the prior study and corresponds to findings seen on the prior MRI. There is no evidence of associated mass effect or midline shift. Vascular: No hyperdense vessel or unexpected calcification. Skull: Normal. Negative for fracture or focal lesion. Sinuses/Orbits: No acute finding. Other: None. IMPRESSION: Small, ill-defined subacute infarct along the para thalamic region on the right. This is slightly more prominent on the current exam when compared to the prior study and corresponds to findings seen on the prior MRI. Electronically Signed   By: Virgina Norfolk M.D.   On: 02/08/2021 21:13   MR ANGIO HEAD WO CONTRAST  Result Date: 02/07/2021 CLINICAL DATA:  Initial evaluation for neuro deficit, stroke suspected. EXAM: MRI HEAD WITHOUT CONTRAST MRA HEAD WITHOUT CONTRAST MRA NECK WITHOUT AND WITH CONTRAST TECHNIQUE:  Multiplanar, multi-echo pulse sequences of the brain and surrounding structures were acquired without intravenous contrast. Angiographic images of the Circle of Willis were acquired using MRA technique without intravenous contrast. Angiographic images of the neck were acquired using MRA technique without and with intravenous contrast. Carotid stenosis measurements (when applicable) are obtained utilizing NASCET criteria, using the distal internal carotid diameter as the denominator. CONTRAST:  18m GADAVIST GADOBUTROL 1 MMOL/ML IV SOLN COMPARISON:  Head CT from earlier the same day. FINDINGS: MRI HEAD FINDINGS Brain: Cerebral volume within normal limits. Minimal chronic microvascular ischemic disease noted involving the pons. 7 mm focus of restricted diffusion seen involving the posterior limb of the right internal capsule, consistent with an acute ischemic infarct (series 5, image 75). No associated hemorrhage or mass effect. No other evidence for acute or subacute ischemia. No other areas of chronic cortical infarction. No evidence for acute or chronic intracranial hemorrhage. No mass lesion or midline shift. No hydrocephalus or extra-axial fluid collection. Pituitary gland suprasellar region normal. Vascular: Major intracranial vascular flow voids are well maintained. Skull and upper cervical spine: Craniocervical junction within normal limits. Bone marrow signal intensity normal. No scalp soft tissue abnormality. Sinuses/Orbits: Patient status post bilateral ocular lens replacement. Paranasal sinuses are largely clear. No significant mastoid effusion. Inner ear structures grossly normal. Other: None. MRA HEAD FINDINGS Anterior circulation: Examination mildly degraded by motion. Visualized distal cervical segments of the internal carotid arteries are patent with antegrade flow. Petrous, cavernous, and supraclinoid segments patent without stenosis or other abnormality. Eight A1 segments widely patent. Normal  anterior communicating artery complex. Anterior cerebral arteries patent without stenosis. No M1 stenosis or occlusion. Normal MCA bifurcations. Distal MCA branches perfused and symmetric. Posterior circulation: Both V4 segments patent without stenosis.  Right vertebral artery dominant. Both PICA patent at their origins. Basilar widely patent to its distal aspect. Superior cerebellar and posterior cerebral arteries widely patent bilaterally. Anatomic variants: None significant.  No aneurysm. MRA NECK FINDINGS Aortic arch: Examination degraded by motion artifact. Visualized aortic arch normal caliber with normal branch pattern. No hemodynamically significant stenosis seen about the origin the great vessels. Right carotid system: Right common and internal carotid arteries widely patent without stenosis, evidence for dissection or occlusion. Left carotid system: Left common and internal carotid arteries widely patent without stenosis, evidence for dissection or occlusion. Vertebral arteries: Both vertebral arteries arise from the subclavian arteries. No proximal subclavian artery stenosis. Both vertebral arteries widely patent without stenosis, evidence for dissection or occlusion. Right vertebral artery dominant. Other: None IMPRESSION: MRI HEAD IMPRESSION: 1. 7 mm acute ischemic nonhemorrhagic infarct involving the posterior limb of the right internal capsule. 2. 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 MRA HEAD IMPRESSION: Normal intracranial MRA. No large vessel occlusion, hemodynamically significant stenosis, or other acute vascular abnormality. MRA NECK IMPRESSION: Normal MRA of the neck. Electronically Signed   By: Jeannine Boga M.D.   On: 02/07/2021 23:21   MR ANGIO NECK W WO CONTRAST  Result Date: 02/07/2021 CLINICAL DATA:  Initial evaluation for neuro deficit, stroke suspected. EXAM: MRI HEAD WITHOUT CONTRAST MRA HEAD WITHOUT CONTRAST MRA NECK WITHOUT AND WITH CONTRAST  TECHNIQUE: Multiplanar, multi-echo pulse sequences of the brain and surrounding structures were acquired without intravenous contrast. Angiographic images of the Circle of Willis were acquired using MRA technique without intravenous contrast. Angiographic images of the neck were acquired using MRA technique without and with intravenous contrast. Carotid stenosis measurements (when applicable) are obtained utilizing NASCET criteria, using the distal internal carotid diameter as the denominator. CONTRAST:  60m GADAVIST GADOBUTROL 1 MMOL/ML IV SOLN COMPARISON:  Head CT from earlier the same day. FINDINGS: MRI HEAD FINDINGS Brain: Cerebral volume within normal limits. Minimal chronic microvascular ischemic disease noted involving the pons. 7 mm focus of restricted diffusion seen involving the posterior limb of the right internal capsule, consistent with an acute ischemic infarct (series 5, image 75). No associated hemorrhage or mass effect. No other evidence for acute or subacute ischemia. No other areas of chronic cortical infarction. No evidence for acute or chronic intracranial hemorrhage. No mass lesion or midline shift. No hydrocephalus or extra-axial fluid collection. Pituitary gland suprasellar region normal. Vascular: Major intracranial vascular flow voids are well maintained. Skull and upper cervical spine: Craniocervical junction within normal limits. Bone marrow signal intensity normal. No scalp soft tissue abnormality. Sinuses/Orbits: Patient status post bilateral ocular lens replacement. Paranasal sinuses are largely clear. No significant mastoid effusion. Inner ear structures grossly normal. Other: None. MRA HEAD FINDINGS Anterior circulation: Examination mildly degraded by motion. Visualized distal cervical segments of the internal carotid arteries are patent with antegrade flow. Petrous, cavernous, and supraclinoid segments patent without stenosis or other abnormality. Eight A1 segments widely patent.  Normal anterior communicating artery complex. Anterior cerebral arteries patent without stenosis. No M1 stenosis or occlusion. Normal MCA bifurcations. Distal MCA branches perfused and symmetric. Posterior circulation: Both V4 segments patent without stenosis. Right vertebral artery dominant. Both PICA patent at their origins. Basilar widely patent to its distal aspect. Superior cerebellar and posterior cerebral arteries widely patent bilaterally. Anatomic variants: None significant.  No aneurysm. MRA NECK FINDINGS Aortic arch: Examination degraded by motion artifact. Visualized aortic arch normal caliber with normal branch pattern. No hemodynamically significant stenosis seen about the origin  the great vessels. Right carotid system: Right common and internal carotid arteries widely patent without stenosis, evidence for dissection or occlusion. Left carotid system: Left common and internal carotid arteries widely patent without stenosis, evidence for dissection or occlusion. Vertebral arteries: Both vertebral arteries arise from the subclavian arteries. No proximal subclavian artery stenosis. Both vertebral arteries widely patent without stenosis, evidence for dissection or occlusion. Right vertebral artery dominant. Other: None IMPRESSION: MRI HEAD IMPRESSION: 1. 7 mm acute ischemic nonhemorrhagic infarct involving the posterior limb of the right internal capsule. 2. 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 MRA HEAD IMPRESSION: Normal intracranial MRA. No large vessel occlusion, hemodynamically significant stenosis, or other acute vascular abnormality. MRA NECK IMPRESSION: Normal MRA of the neck. Electronically Signed   By: Jeannine Boga M.D.   On: 02/07/2021 23:21   MR BRAIN WO CONTRAST  Result Date: 02/07/2021 CLINICAL DATA:  Initial evaluation for neuro deficit, stroke suspected. EXAM: MRI HEAD WITHOUT CONTRAST MRA HEAD WITHOUT CONTRAST MRA NECK WITHOUT AND WITH CONTRAST  TECHNIQUE: Multiplanar, multi-echo pulse sequences of the brain and surrounding structures were acquired without intravenous contrast. Angiographic images of the Circle of Willis were acquired using MRA technique without intravenous contrast. Angiographic images of the neck were acquired using MRA technique without and with intravenous contrast. Carotid stenosis measurements (when applicable) are obtained utilizing NASCET criteria, using the distal internal carotid diameter as the denominator. CONTRAST:  8m GADAVIST GADOBUTROL 1 MMOL/ML IV SOLN COMPARISON:  Head CT from earlier the same day. FINDINGS: MRI HEAD FINDINGS Brain: Cerebral volume within normal limits. Minimal chronic microvascular ischemic disease noted involving the pons. 7 mm focus of restricted diffusion seen involving the posterior limb of the right internal capsule, consistent with an acute ischemic infarct (series 5, image 75). No associated hemorrhage or mass effect. No other evidence for acute or subacute ischemia. No other areas of chronic cortical infarction. No evidence for acute or chronic intracranial hemorrhage. No mass lesion or midline shift. No hydrocephalus or extra-axial fluid collection. Pituitary gland suprasellar region normal. Vascular: Major intracranial vascular flow voids are well maintained. Skull and upper cervical spine: Craniocervical junction within normal limits. Bone marrow signal intensity normal. No scalp soft tissue abnormality. Sinuses/Orbits: Patient status post bilateral ocular lens replacement. Paranasal sinuses are largely clear. No significant mastoid effusion. Inner ear structures grossly normal. Other: None. MRA HEAD FINDINGS Anterior circulation: Examination mildly degraded by motion. Visualized distal cervical segments of the internal carotid arteries are patent with antegrade flow. Petrous, cavernous, and supraclinoid segments patent without stenosis or other abnormality. Eight A1 segments widely patent.  Normal anterior communicating artery complex. Anterior cerebral arteries patent without stenosis. No M1 stenosis or occlusion. Normal MCA bifurcations. Distal MCA branches perfused and symmetric. Posterior circulation: Both V4 segments patent without stenosis. Right vertebral artery dominant. Both PICA patent at their origins. Basilar widely patent to its distal aspect. Superior cerebellar and posterior cerebral arteries widely patent bilaterally. Anatomic variants: None significant.  No aneurysm. MRA NECK FINDINGS Aortic arch: Examination degraded by motion artifact. Visualized aortic arch normal caliber with normal branch pattern. No hemodynamically significant stenosis seen about the origin the great vessels. Right carotid system: Right common and internal carotid arteries widely patent without stenosis, evidence for dissection or occlusion. Left carotid system: Left common and internal carotid arteries widely patent without stenosis, evidence for dissection or occlusion. Vertebral arteries: Both vertebral arteries arise from the subclavian arteries. No proximal subclavian artery stenosis. Both vertebral arteries widely patent without stenosis, evidence  for dissection or occlusion. Right vertebral artery dominant. Other: None IMPRESSION: MRI HEAD IMPRESSION: 1. 7 mm acute ischemic nonhemorrhagic infarct involving the posterior limb of the right internal capsule. 2. 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 MRA HEAD IMPRESSION: Normal intracranial MRA. No large vessel occlusion, hemodynamically significant stenosis, or other acute vascular abnormality. MRA NECK IMPRESSION: Normal MRA of the neck. Electronically Signed   By: Jeannine Boga M.D.   On: 02/07/2021 23:21   ECHOCARDIOGRAM COMPLETE  Result Date: 02/08/2021    ECHOCARDIOGRAM REPORT   Patient Name:   PRUDIE GUTHRIDGE Date of Exam: 02/08/2021 Medical Rec #:  [id number]      Height:       65.0 in Accession #:    [id number]      Weight:       200.2 lb Date of Birth:  30-Nov-1950       BSA:          1.979 m Patient Age:    81 years       BP:           137/70 mmHg Patient Gender: F              HR:           95 bpm. Exam Location:  Inpatient Procedure: 2D Echo, Cardiac Doppler and Color Doppler Indications:    TIA  History:        Patient has no prior history of Echocardiogram examinations.  Sonographer:    Merrie Roof RDCS Referring Phys: [id number] [place]  1. Left ventricular ejection fraction, by estimation, is 65 to 70%. The left ventricle has normal function. The left ventricle has no regional wall motion abnormalities. Left ventricular diastolic parameters are consistent with Grade I diastolic dysfunction (impaired relaxation).  2. Right ventricular systolic function is normal. The right ventricular size is normal.  3. The mitral valve is normal in structure. No evidence of mitral valve regurgitation. No evidence of mitral stenosis. Moderate to severe mitral annular calcification.  4. The aortic valve is tricuspid. There is mild thickening of the aortic valve. Aortic valve regurgitation is not visualized. Aortic valve sclerosis is present, with no evidence of aortic valve stenosis.  5. The inferior vena cava is normal in size with greater than 50% respiratory variability, suggesting right atrial pressure of 3 mmHg. FINDINGS  Left Ventricle: Left ventricular ejection fraction, by estimation, is 65 to 70%. The left ventricle has normal function. The left ventricle has no regional wall motion abnormalities. The left ventricular internal cavity size was normal in size. There is  no left ventricular hypertrophy. Left ventricular diastolic parameters are consistent with Grade I diastolic dysfunction (impaired relaxation). Normal left ventricular filling pressure. Right Ventricle: The right ventricular size is normal. No increase in right ventricular wall thickness. Right ventricular systolic function is normal. Left Atrium:  Left atrial size was normal in size. Right Atrium: Right atrial size was normal in size. Pericardium: There is no evidence of pericardial effusion. Mitral Valve: The mitral valve is normal in structure. Moderate to severe mitral annular calcification. No evidence of mitral valve regurgitation. No evidence of mitral valve stenosis. Tricuspid Valve: The tricuspid valve is normal in structure. Tricuspid valve regurgitation is not demonstrated. No evidence of tricuspid stenosis. Aortic Valve: The aortic valve is tricuspid. There is mild thickening of the aortic valve. Aortic valve regurgitation is not visualized. Aortic valve sclerosis is present, with no evidence of aortic valve stenosis. Aortic valve  mean gradient measures 5.0  mmHg. Aortic valve peak gradient measures 8.4 mmHg. Aortic valve area, by VTI measures 2.61 cm. Pulmonic Valve: The pulmonic valve was normal in structure. Pulmonic valve regurgitation is not visualized. No evidence of pulmonic stenosis. Aorta: The aortic root is normal in size and structure. Venous: The inferior vena cava was not well visualized. The inferior vena cava is normal in size with greater than 50% respiratory variability, suggesting right atrial pressure of 3 mmHg. IAS/Shunts: No atrial level shunt detected by color flow Doppler.  LEFT VENTRICLE PLAX 2D LVIDd:         3.50 cm   Diastology LVIDs:         2.40 cm   LV e' medial:    6.20 cm/s LV PW:         1.00 cm   LV E/e' medial:  7.7 LV IVS:        1.00 cm   LV e' lateral:   7.51 cm/s LVOT diam:     2.00 cm   LV E/e' lateral: 6.3 LV SV:         63 LV SV Index:   32 LVOT Area:     3.14 cm  LEFT ATRIUM           Index        RIGHT ATRIUM           Index LA diam:      3.70 cm 1.87 cm/m   RA Area:     12.30 cm LA Vol (A2C): 34.1 ml 17.23 ml/m  RA Volume:   27.00 ml  13.64 ml/m LA Vol (A4C): 48.8 ml 24.66 ml/m  AORTIC VALVE AV Area (Vmax):    2.60 cm AV Area (Vmean):   2.64 cm AV Area (VTI):     2.61 cm AV Vmax:            145.00 cm/s AV Vmean:          97.700 cm/s AV VTI:            0.242 m AV Peak Grad:      8.4 mmHg AV Mean Grad:      5.0 mmHg LVOT Vmax:         120.00 cm/s LVOT Vmean:        82.000 cm/s LVOT VTI:          0.201 m LVOT/AV VTI ratio: 0.83  AORTA Ao Root diam: 2.90 cm Ao Asc diam:  2.80 cm MITRAL VALVE MV Area (PHT): 4.17 cm    SHUNTS MV Decel Time: 182 msec    Systemic VTI:  0.20 m MV E velocity: 47.67 cm/s  Systemic Diam: 2.00 cm MV A velocity: 99.40 cm/s MV E/A ratio:  0.48 Mihai Croitoru MD Electronically signed by Sanda Klein MD Signature Date/Time: 02/08/2021/2:53:21 PM    Final    CT HEAD CODE STROKE WO CONTRAST  Result Date: 02/07/2021 CLINICAL DATA:  Code stroke. Initial evaluation for acute stroke. Right-sided facial droop. EXAM: CT HEAD WITHOUT CONTRAST TECHNIQUE: Contiguous axial images were obtained from the base of the skull through the vertex without intravenous contrast. COMPARISON:  CT from 04/22/2015. FINDINGS: Brain: Cerebral volume within normal limits. No acute intracranial hemorrhage. No acute large vessel territory infarct. No mass lesion, midline shift or mass effect. No hydrocephalus or extra-axial fluid collection. Vascular: No hyperdense vessel. Skull: Scalp soft tissues and calvarium within normal limits. Sinuses/Orbits: Globes and orbital soft tissues within normal limits. Paranasal  sinuses and mastoid air cells are clear. Other: None. ASPECTS [facility] Stroke Program Early CT Score) - Ganglionic level infarction (caudate, lentiform nuclei, internal capsule, insula, M1-M3 cortex): 7 - Supraganglionic infarction (M4-M6 cortex): 3 Total score (0-10 with 10 being normal): 10 IMPRESSION: 1. No acute intracranial abnormality. 2. ASPECTS is 10. Results were communicated by telephone at the time of interpretation on 02/07/2021 at 8:22 pm to provider Dr. Lorrin Goodell by Dr. Lovenia Kim. Electronically Signed   By: Jeannine Boga M.D.   On: 02/07/2021 20:26    Labs:  Basic  Metabolic Panel: Recent Labs  Lab 02/15/21 0548 02/20/21 0552  NA 142 138  K 4.0 4.8  CL 103 103  CO2 29 29  GLUCOSE 131* 113*  BUN 13 15  CREATININE 0.94 1.13*  CALCIUM 8.7* 8.9    CBC: Recent Labs  Lab 02/20/21 0552  WBC 8.7  NEUTROABS 6.0  HGB 10.6*  HCT 32.6*  MCV 85.1  PLT 266    CBG: Recent Labs  Lab 02/20/21 0625 02/20/21 1123 02/20/21 1615 02/20/21 2100 02/21/21 0552  GLUCAP 110* 113* 104* 153* 128*    Brief HPI:   Lindsay Rice is a 71 y.o. female ***   Hospital Course: Lindsay Rice was admitted to rehab 02/10/2021 for inpatient therapies to consist of PT, ST and OT at least three hours five days a week. Past admission physiatrist, therapy team and rehab RN have worked together to provide customized collaborative inpatient rehab.   Blood pressures were monitored on TID basis and   Diabetes has been monitored with ac/hs CBG checks and SSI was use prn for tighter BS control.    Rehab course: During patient's stay in rehab weekly team conferences were held to monitor patient's progress, set goals and discuss barriers to discharge. At admission, patient required  He/She  has had improvement in activity tolerance, balance, postural control as well as ability to compensate for deficits. He/She has had improvement in functional use RUE/LUE  and RLE/LLE as well as improvement in awareness       Disposition:  There are no questions and answers to display.         Diet:  Special Instructions:  Discharge Instructions     Ambulatory referral to Neurology   Complete by: As directed    An appointment is requested in approximately: 4 weeks right basal ganglia infarction      Allergies as of 02/21/2021       Reactions   Iodinated Contrast Media Hives, Shortness Of Breath, Rash, Other (See Comments)   Required an Epi-pen   Iodine Hives, Shortness Of Breath, Rash   Shellfish-derived Products Hives, Shortness Of Breath   Cefaclor Rash    Cheese Other (See Comments)   Headaches/migraines from Swiss and Parmesan only   Covid-19 (mrna) Vaccine Nausea And Vomiting, Other (See Comments)   Severe Weakness, also   Levofloxacin Hives, Rash   Penicillins Hives, Rash   Wound Dressing Adhesive Other (See Comments)   Paper Tape ok        Medication List     STOP taking these medications    acetaminophen 325 MG tablet Commonly known as: TYLENOL   hydrochlorothiazide 25 MG tablet Commonly known as: HYDRODIURIL   losartan 100 MG tablet Commonly known as: COZAAR   metoprolol tartrate 100 MG tablet Commonly known as: LOPRESSOR   montelukast 10 MG tablet Commonly known as: SINGULAIR   pantoprazole 40 MG tablet Commonly known as: PROTONIX  TAKE these medications    Ajovy 225 MG/1.5ML Sosy Generic drug: Fremanezumab-vfrm Inject 225 mg into the skin every 30 (thirty) days.   albuterol 108 (90 Base) MCG/ACT inhaler Commonly known as: VENTOLIN HFA Inhale 2 puffs into the lungs every 6 (six) hours as needed for wheezing or shortness of breath.   amitriptyline 25 MG tablet Commonly known as: ELAVIL Take 25 mg by mouth at bedtime.   amLODipine 5 MG tablet Commonly known as: NORVASC Take 1 tablet (5 mg total) by mouth daily.   aspirin 81 MG chewable tablet Chew 1 tablet (81 mg total) by mouth daily. Till 03/03/21 then stop taking it. What changed: additional instructions Notes to patient: STOP TAKING AFTER JAN 13TH   atorvastatin 80 MG tablet Commonly known as: LIPITOR Take 1 tablet (80 mg total) by mouth at bedtime.   blood glucose meter kit and supplies Kit Dispense based on patient and insurance preference. Use up to four times daily as directed. Notes to patient: CHECK BEFORE BREAKFAST/SUPPER ONE DAY AND ALTERNATE WITH BEFORE LUNCH/BEDTME.    busPIRone 5 MG tablet Commonly known as: BUSPAR Take 5 mg by mouth in the morning and at bedtime.   butalbital-acetaminophen-caffeine 50-325-40 MG  tablet Commonly known as: FIORICET Take 1 tablet by mouth in the morning and at bedtime.   clopidogrel 75 MG tablet Commonly known as: PLAVIX Take 1 tablet (75 mg total) by mouth daily.   diclofenac Sodium 1 % Gel Commonly known as: VOLTAREN Apply 2 g topically 3 (three) times daily. To right shoulder   divalproex 500 MG 24 hr tablet Commonly known as: DEPAKOTE ER Take 500 mg by mouth at bedtime.   empagliflozin 25 MG Tabs tablet Commonly known as: JARDIANCE Take 1 tablet (25 mg total) by mouth daily.   EPINEPHrine 0.3 mg/0.3 mL Soaj injection Commonly known as: EPI-PEN Inject 0.3 mg into the muscle once as needed for anaphylaxis.   famotidine 40 MG tablet Commonly known as: PEPCID Take 1 tablet (40 mg total) by mouth at bedtime.   fluconazole 100 MG tablet Commonly known as: Diflucan Take 1 tablet (100 mg total) by mouth once for 1 dose. Start taking on: February 24, 2021   fluticasone-salmeterol 115-21 MCG/ACT inhaler Commonly known as: ADVAIR HFA Inhale 2 puffs into the lungs 2 (two) times daily as needed (for cold-induced asthma symptoms).   gabapentin 300 MG capsule Commonly known as: NEURONTIN Take 300 mg by mouth in the morning and at bedtime.   glimepiride 2 MG tablet Commonly known as: AMARYL Take 1 tablet (2 mg total) by mouth daily with breakfast.   levothyroxine 75 MCG tablet Commonly known as: SYNTHROID Take 75 mcg by mouth daily before breakfast.   ondansetron 4 MG disintegrating tablet Commonly known as: ZOFRAN-ODT Take 4 mg by mouth every 8 (eight) hours as needed for nausea or vomiting (dissolve orally).   promethazine 25 MG suppository Commonly known as: PHENERGAN Place 25 mg rectally every 6 (six) hours as needed for nausea or vomiting.   senna-docusate 8.6-50 MG tablet Commonly known as: Senokot-S Take 1 tablet by mouth at bedtime as needed for moderate constipation or mild constipation.   sertraline 100 MG tablet Commonly known as:  ZOLOFT Take 50-100 mg by mouth See admin instructions. Take 100 mg by mouth in the morning 50 mg at bedtime   triamcinolone cream 0.1 % Commonly known as: KENALOG Apply 1 application topically 2 (two) times daily as needed (to affected areas).  Follow-up Information     Lovorn, Jinny Blossom, MD Follow up.   Specialty: Physical Medicine and Rehabilitation Why: Office to call for appointment Contact information: [address] [place] [address] [phone]         [facility]. Call.   Specialty: Neurology Why: for post stroke follow up Contact information: [address] [place], [place], [place]. Call.   Specialty: Physician Assistant Why: for post hospital follow up Contact information: [place] [address] [phone]                 Signed: Bary Leriche 02/21/2021, 10:22 AM

## 2021-02-21 NOTE — Progress Notes (Signed)
PROGRESS NOTE   Subjective/Complaints:  Pt reports getting yeast infection- was asking for something to treat it.   Ready for d/c- Gave self the Ajovy.   ROS:   Pt denies SOB, abd pain, CP, N/V/C/D, and vision changes s     Objective:   No results found. Recent Labs    02/20/21 0552  WBC 8.7  HGB 10.6*  HCT 32.6*  PLT 266    Recent Labs    02/20/21 0552  NA 138  K 4.8  CL 103  CO2 29  GLUCOSE 113*  BUN 15  CREATININE 1.13*  CALCIUM 8.9     Intake/Output Summary (Last 24 hours) at 02/21/2021 0818 Last data filed at 02/21/2021 0813 Gross per 24 hour  Intake 944 ml  Output --  Net 944 ml        Physical Exam: Vital Signs Blood pressure (!) 124/50, pulse 78, temperature 97.9 F (36.6 C), temperature source Oral, resp. rate 18, height 5\' 5"  (1.651 m), weight 89.3 kg, SpO2 93 %.    General: awake, alert, appropriate, sitting up EOB to go to bathroom; NAD HENT: conjugate gaze; oropharynx moist CV: regular rate; no JVD Pulmonary: CTA B/L; no W/R/R- good air movement GI: soft, NT, ND, (+)BS Psychiatric: appropriate Neurological: Ox3;   Ext: no clubbing, cyanosis, or edema Psych: pleasant and cooperative  Skin: Clean and intact without signs of breakdown Neuro:  Alert and oriented x 3. Normal insight and awareness. Intact Memory. Normal language and speech. Cranial nerve exam unremarkable except for left central 7 and left perioral numbness. RUE and RLE 5/5. LUE 3+/5 prox to distal. LLE 4- prox to 4/5 distally. Decreased sensory loss along left hand. Mild L inattention improving.  Musculoskeletal: left shoulder + pain with IR and impingement maneuver   Assessment/Plan: 1. Functional deficits which require 3+ hours per day of interdisciplinary therapy in a comprehensive inpatient rehab setting. Physiatrist is providing close team supervision and 24 hour management of active medical problems listed  below. Physiatrist and rehab team continue to assess barriers to discharge/monitor patient progress toward functional and medical goals  Care Tool:  Bathing    Body parts bathed by patient: Right arm, Left arm, Chest, Abdomen, Front perineal area, Buttocks, Right upper leg, Left upper leg, Right lower leg, Left lower leg, Face         Bathing assist Assist Level: Set up assist     Upper Body Dressing/Undressing Upper body dressing   What is the patient wearing?: Bra, Pull over shirt    Upper body assist Assist Level: Independent with assistive device    Lower Body Dressing/Undressing Lower body dressing      What is the patient wearing?: Pants, Underwear/pull up     Lower body assist Assist for lower body dressing: Independent with assitive device     Toileting Toileting    Toileting assist Assist for toileting: Independent with assistive device     Transfers Chair/bed transfer  Transfers assist     Chair/bed transfer assist level: Independent with assistive device Chair/bed transfer assistive device: Research officer, political party   Ambulation assist      Assist level: Independent  with assistive device Assistive device: Cane-straight Max distance: >350 ft   Walk 10 feet activity   Assist     Assist level: Independent with assistive device Assistive device: Cane-straight   Walk 50 feet activity   Assist    Assist level: Independent with assistive device Assistive device: Cane-straight    Walk 150 feet activity   Assist Walk 150 feet activity did not occur: Safety/medical concerns  Assist level: Independent with assistive device Assistive device: Cane-straight    Walk 10 feet on uneven surface  activity   Assist Walk 10 feet on uneven surfaces activity did not occur: Safety/medical concerns   Assist level: Contact Guard/Touching assist Assistive device: Cane-straight   Wheelchair     Assist Is the patient using a  wheelchair?: No Type of Wheelchair: Manual    Wheelchair assist level: Total Assistance - Patient < 25% Max wheelchair distance: 300 ft    Wheelchair 50 feet with 2 turns activity    Assist        Assist Level: Total Assistance - Patient < 25%   Wheelchair 150 feet activity     Assist      Assist Level: Total Assistance - Patient < 25%   Blood pressure (!) 124/50, pulse 78, temperature 97.9 F (36.6 C), temperature source Oral, resp. rate 18, height 5\' 5"  (1.651 m), weight 89.3 kg, SpO2 93 %.  Medical Problem List and Plan: 1.  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             -patient may shower             -ELOS/Goals: 02/21/21, mod I to supervision goals  D/c today- will need f/u with Dr Dagoberto Ligas at least 1x. 2.  Antithrombotics: -DVT/anticoagulation:  Pharmaceutical: Lovenox             -antiplatelet therapy: Aspirin 81 mg daily and Plavix 75 mg daily x3 weeks then Plavix alone 3. Pain Management/chronic right RTC injury: Elavil 25 mg nightly, Neurontin 300 mg twice daily, Fioricet as needed.  12/28 -discussed appropriate mechanics and engaging left arm more in functional activities   -apply voltaren gel to right shoulder TID  12/31- pain controlled today- continue regimen  1/2- Pain controlled- HA's a little better  1/3- HA's better s/p Ajovy- con't monthly- shouldn't take Triptans now that had stroke.  4. Anxiety: continue BuSpar 5 mg twice daily, Zoloft 100 mg daily and 50 mg nightly             -moods seems up beat. Family appears supportive.   12/28 in good spirits             -antipsychotic agents: N/A 5. Neuropsych: This patient is capable of making decisions on her own behalf. 6. Skin/Wound Care: Routine skin checks 7. Fluids/Electrolytes/Nutrition: Routine in and outs with follow-up chemistries 8.  Permissive hypertension.   Currently on Norvasc 5 mg daily.  Patient also on HCTZ 25 mg daily, Cozaar 100 mg every morning, Lopressor 100 mg every morning and 50 mg nightly prior to admission.   -in "range" at present. Continue to follow12/29- BP controlled- con't regimen 12/30 BP very slightly elevated, but overall has been controlled- con't regimen 9.  History of migraine headaches.  Depakote 500 mg nightly,Ajovy 225 mg subcutaneous every 30 days  1/2- getting Ajovy- was due 10.  OSA.  Continue inhalers as directed 11.  Hypothyroidism.  Synthroid 12.  Diabetes mellitus.  Hemoglobin A1c 8.3.  Jardiance 25 mg daily.  Check blood sugars before meals and at bedtime  CBG (last 3)  Recent Labs    02/20/21 1615 02/20/21 2100 02/21/21 0552  GLUCAP 104* 153* 128*   12/24 -monitor with jardiance on board. Seems to need better control    -consider amaryl trial  12/28- CBGs 110s-200   -Amaryl increased to 2 mg daily effective today--observe  12/29- CBGs slightly better- just got dose increase yesterday- will monitor 1/2- CBGs looking much better- con't regimen 13.  Hyperlipidemia.  Lipitor 14.  Obesity.  BMI 33.31.  Dietary follow-up  15. AKI  12/26- Cr up to 1.26 from 0.93- BUN 20- so just shy of being dry per guidelines- will encourage fluids and recheck Wednesday. 12/28 Cr 0.94 today, likely just pre-renal bump  -continue to encourage fluids  1/2- Cr 1.13- wil have pt push fluids.   1/3- encouraged pt to continue to drink 6-8 cups/day.  16. Migraines: Ajovy ordered   LOS: 11 days A FACE TO FACE EVALUATION WAS PERFORMED  Lindsay Rice 02/21/2021, 8:18 AM

## 2021-03-10 ENCOUNTER — Other Ambulatory Visit: Payer: Self-pay

## 2021-03-10 ENCOUNTER — Encounter: Payer: Self-pay | Admitting: Physical Medicine and Rehabilitation

## 2021-03-10 ENCOUNTER — Encounter: Payer: Medicare PPO | Attending: Physical Medicine and Rehabilitation | Admitting: Physical Medicine and Rehabilitation

## 2021-03-10 ENCOUNTER — Other Ambulatory Visit: Payer: Self-pay | Admitting: Physical Medicine and Rehabilitation

## 2021-03-10 VITALS — BP 146/84 | HR 109 | Ht 65.0 in | Wt 197.0 lb

## 2021-03-10 DIAGNOSIS — E119 Type 2 diabetes mellitus without complications: Secondary | ICD-10-CM | POA: Insufficient documentation

## 2021-03-10 DIAGNOSIS — G8194 Hemiplegia, unspecified affecting left nondominant side: Secondary | ICD-10-CM | POA: Diagnosis present

## 2021-03-10 DIAGNOSIS — M62838 Other muscle spasm: Secondary | ICD-10-CM | POA: Diagnosis present

## 2021-03-10 DIAGNOSIS — I639 Cerebral infarction, unspecified: Secondary | ICD-10-CM | POA: Insufficient documentation

## 2021-03-10 DIAGNOSIS — I10 Essential (primary) hypertension: Secondary | ICD-10-CM | POA: Insufficient documentation

## 2021-03-10 DIAGNOSIS — E1149 Type 2 diabetes mellitus with other diabetic neurological complication: Secondary | ICD-10-CM | POA: Diagnosis present

## 2021-03-10 MED ORDER — ACCU-CHEK SOFTCLIX LANCETS MISC: 0 refills | Status: AC

## 2021-03-10 MED ORDER — EMPAGLIFLOZIN 25 MG PO TABS: 25.0000 mg | ORAL_TABLET | Freq: Every day | ORAL | 1 refills | Status: DC

## 2021-03-10 MED ORDER — COOL BLOOD GLUCOSE TEST STRIPS VI STRP: ORAL_STRIP | 0 refills | Status: AC

## 2021-03-10 MED ORDER — METHOCARBAMOL 500 MG PO TABS: 500.0000 mg | ORAL_TABLET | Freq: Three times a day (TID) | ORAL | 0 refills | Status: DC | PRN

## 2021-03-10 MED ORDER — AMLODIPINE BESYLATE 10 MG PO TABS: 10.0000 mg | ORAL_TABLET | Freq: Every day | ORAL | 1 refills | Status: AC

## 2021-03-10 NOTE — Progress Notes (Signed)
Subjective:    Patient ID: Lindsay Rice, female    DOB: 1950/06/04, 71 y.o.   MRN: [id number]  HPI Pt is a 71 yr old female with hx of L hemiparesis due to R basal ganglia infarct/posterior limb/R internal capsule-late 12/22- with hx of migraines and R RTC injury/tear; HTN; DM A1c 8.3, and hypothyroidism-  Here for f/u on stroke.   Here with Tresa Endo- daughter.   Fell Tuesday night- trying to make bed and had on slick socks- and fell on butt- bottom sore ever since- on hardwood floor- doesn't know if has bruise.   Isn't taking Jardiance- was on d/c paperwork- Didn't call in supplies-   Didn't get Jardiance  Not taking Metoprolol/losartan or HCTZ in hospital-  singulair or protonix.  Taking  Norvasc 5 mg daily, but BP running 140s-150s systolic.   Saw PCP on 1/6, but not regular doctor and they didn't pick up these issues as above.    Asking if can drive.  Doing H/H therapy- PT and OT- getting ready to run out of visits. [place].     Pain Inventory Average Pain 2 Pain Right Now 2 My pain is intermittent, constant, and aching  LOCATION OF PAIN  Right shoulder   BOWEL Number of stools per week: 7   BLADDER Pads In and out cath, frequency n/a Able to self cath No  Bladder incontinence No  Frequent urination Yes  Leakage with coughing No  Difficulty starting stream No  Incomplete bladder emptying No    Mobility walk with assistance use a cane use a walker how many minutes can you walk? 30 minutes ability to climb steps?  yes do you drive?  no  Function retired I need assistance with the following:  household duties Do you have any goals in this area?  yes  Neuro/Psych tingling depression  Prior Studies Any changes since last visit?  no  Physicians involved in your care Any changes since last visit?  no   History reviewed. No pertinent family history. Social History   Socioeconomic History   Marital status: Married    Spouse name: Not on file    Number of children: Not on file   Years of education: Not on file   Highest education level: Not on file  Occupational History   Not on file  Tobacco Use   Smoking status: Never   Smokeless tobacco: Never  Vaping Use   Vaping Use: Never used  Substance and Sexual Activity   Alcohol use: Not Currently   Drug use: Never   Sexual activity: Not Currently  Other Topics Concern   Not on file  Social History Narrative   Not on file   Social Determinants of Health   Financial Resource Strain: Not on file  Food Insecurity: Not on file  Transportation Needs: Not on file  Physical Activity: Not on file  Stress: Not on file  Social Connections: Not on file   History reviewed. No pertinent surgical history. Past Medical History:  Diagnosis Date   Diabetes mellitus without complication (HCC)    Hypertension    Migraine headache    Mitral valve prolapse    Obesity (BMI 30.0-34.9)    Sleep apnea    BP (!) 146/84    Pulse (!) 109    Ht 5\' 5"  (1.651 m)    Wt 197 lb (89.4 kg)    SpO2 95%    BMI 32.78 kg/m   Opioid Risk Score:   Fall Risk  Score:  `1  Depression screen PHQ 2/9  No flowsheet data found.  Review of Systems  Eyes:  Positive for visual disturbance.  Genitourinary:  Positive for frequency.  Musculoskeletal:  Positive for gait problem.       Left arm, left leg, left weakness  Neurological:  Positive for weakness and headaches.      Objective:   Physical Exam Awake, alert, appropriate, sitting on table, accompanied by daughter Tresa Endo, NAD No RW_ using single point cane with quad cane attachment MS: LUE deltoid/biceps/triceps 5-/5; WE 5-/5; grip 4+/5 and FA 4-/5 LLE- HF 4+/5; KE/KF 4+/5; DF and PF 4+/5  LE- no LE edema B/L   Neuro: Hoffman's in LUE- no clonus-  No decreased sensation to light touch in L side vs R side.    No increased tone on exam  Has a  large palpable muscle spasm in L buttock- large- no bruising.   Assessment & Plan:    Pt is a 71  yr old female with hx of L hemiparesis due to R basal ganglia infarct/posterior limb/R internal capsule-late 12/22- with hx of migraines and R RTC injury/tear; HTN; DM A1c 8.3, and hypothyroidism-  Here for f/u on stroke.   Sent in Rx for 1 month for test strips and lancets ot measure BG's- has bene using husbands.   2. DM- Will need to take Jardiance- 25 mg daily- 30 days with 1 refill- get from PCP in future. Sent in a new Rx.   3. HTN- slightly elevated-  I will increase Norvasc/Amlodipine- 10 mg daily- will need to take Norvasc 2 tabs of the 5 mg daily that she has until picks up new Rx.  Has no LE swelling.   4. Neurology decided when you can drive, but it's a minimum of 8 weeks. Sees end of January.    5. Will order outpatient PT - no OT at this location or [place], so will wait on OT.    6. Educated spasticity- increased tightness- or stiff all the time and ROM won't make it much better- only on left side- there are meds to  help , as long as you let me know. Call ASAP if develops- impairs function or is painful.   7. Sit on tennis ball- minimum 10-15 minutes- sit on pillow with tennis ball on top then your bottom-   8. Will try Robaxin as needed 500 mg q8 hours as needed- for muscle spasms in bottom- can make sleepy. Gave 50 tabs no refills.   9.  F/U in 3 months-    I spent a total of 34 minutes on total visit- educating on spasticity; muscle spasms; tennis ball, as detailed above.

## 2021-03-10 NOTE — Patient Instructions (Signed)
Pt is a 71 yr old female with hx of L hemiparesis due to R basal ganglia infarct/posterior limb/R internal capsule-late 12/22- with hx of migraines and R RTC injury/tear; HTN; DM A1c 8.3, and hypothyroidism-  Here for f/u on stroke.   Sent in Rx for 1 month for test strips and lancets ot measure BG's- has bene using husbands.   2. DM- Will need to take Jardiance- 25 mg daily- 30 days with 1 refill- get from PCP in future. Sent in a new Rx.   3. HTN- slightly elevated-  I will increase Norvasc/Amlodipine- 10 mg daily- will need to take Norvasc 2 tabs of the 5 mg daily that she has until picks up new Rx.  Has no LE swelling.   4. Neurology decided when you can drive, but it's a minimum of 8 weeks. Sees end of January.    5. Will order outpatient PT - no OT at this location or [place], so will wait on OT.    6. Educated spasticity- increased tightness- or stiff all the time and ROM won't make it much better- only on left side- there are meds to  help , as long as you let me know. Call ASAP if develops- impairs function or is painful.   7. Sit on tennis ball- minimum 10-15 minutes- sit on pillow with tennis ball on top then your bottom-   8. Will try Robaxin as needed 500 mg q8 hours as needed- for muscle spasms in bottom- can make sleepy. Gave 50 tabs no refills.   9.  F/U in 3 months-

## 2021-03-13 NOTE — Telephone Encounter (Signed)
Pharmacy is requesting 90 day supply. Is it okay to refill for 90 days?

## 2021-03-13 NOTE — Telephone Encounter (Signed)
No- needs to get from PCP in future- I just increased to get her to PCP- I just wrote for 30 days, if I remember- thanks- ML

## 2021-03-28 ENCOUNTER — Emergency Department (HOSPITAL_COMMUNITY): Payer: Medicare Other

## 2021-03-28 ENCOUNTER — Inpatient Hospital Stay (HOSPITAL_COMMUNITY)
Admission: EM | Admit: 2021-03-28 | Discharge: 2021-03-31 | DRG: 864 | Disposition: A | Discharge status: Home / Self Care | Payer: Medicare Other | Attending: Internal Medicine | Admitting: Internal Medicine

## 2021-03-28 DIAGNOSIS — Z79890 Hormone replacement therapy (postmenopausal): Secondary | ICD-10-CM

## 2021-03-28 DIAGNOSIS — R509 Fever, unspecified: Principal | ICD-10-CM

## 2021-03-28 DIAGNOSIS — Z881 Allergy status to other antibiotic agents status: Secondary | ICD-10-CM | POA: Diagnosis not present

## 2021-03-28 DIAGNOSIS — Z7902 Long term (current) use of antithrombotics/antiplatelets: Secondary | ICD-10-CM

## 2021-03-28 DIAGNOSIS — E1149 Type 2 diabetes mellitus with other diabetic neurological complication: Secondary | ICD-10-CM

## 2021-03-28 DIAGNOSIS — Z79899 Other long term (current) drug therapy: Secondary | ICD-10-CM | POA: Diagnosis not present

## 2021-03-28 DIAGNOSIS — Z880 Allergy status to penicillin: Secondary | ICD-10-CM

## 2021-03-28 DIAGNOSIS — G8929 Other chronic pain: Secondary | ICD-10-CM | POA: Diagnosis present

## 2021-03-28 DIAGNOSIS — Z7982 Long term (current) use of aspirin: Secondary | ICD-10-CM

## 2021-03-28 DIAGNOSIS — E039 Hypothyroidism, unspecified: Secondary | ICD-10-CM | POA: Diagnosis present

## 2021-03-28 DIAGNOSIS — E669 Obesity, unspecified: Secondary | ICD-10-CM | POA: Diagnosis not present

## 2021-03-28 DIAGNOSIS — Z20822 Contact with and (suspected) exposure to covid-19: Secondary | ICD-10-CM | POA: Diagnosis not present

## 2021-03-28 DIAGNOSIS — R0902 Hypoxemia: Secondary | ICD-10-CM | POA: Diagnosis present

## 2021-03-28 DIAGNOSIS — G43909 Migraine, unspecified, not intractable, without status migrainosus: Secondary | ICD-10-CM | POA: Diagnosis not present

## 2021-03-28 DIAGNOSIS — Z888 Allergy status to other drugs, medicaments and biological substances status: Secondary | ICD-10-CM | POA: Diagnosis not present

## 2021-03-28 DIAGNOSIS — Z91013 Allergy to seafood: Secondary | ICD-10-CM

## 2021-03-28 DIAGNOSIS — Z6833 Body mass index (BMI) 33.0-33.9, adult: Secondary | ICD-10-CM | POA: Diagnosis not present

## 2021-03-28 DIAGNOSIS — R000 Tachycardia, unspecified: Secondary | ICD-10-CM | POA: Diagnosis present

## 2021-03-28 DIAGNOSIS — Z7984 Long term (current) use of oral hypoglycemic drugs: Secondary | ICD-10-CM

## 2021-03-28 DIAGNOSIS — R0781 Pleurodynia: Secondary | ICD-10-CM | POA: Diagnosis not present

## 2021-03-28 DIAGNOSIS — I10 Essential (primary) hypertension: Secondary | ICD-10-CM | POA: Diagnosis present

## 2021-03-28 DIAGNOSIS — Z8661 Personal history of infections of the central nervous system: Secondary | ICD-10-CM | POA: Diagnosis not present

## 2021-03-28 DIAGNOSIS — R519 Headache, unspecified: Secondary | ICD-10-CM | POA: Diagnosis present

## 2021-03-28 DIAGNOSIS — Z7951 Long term (current) use of inhaled steroids: Secondary | ICD-10-CM | POA: Diagnosis not present

## 2021-03-28 DIAGNOSIS — Z91041 Radiographic dye allergy status: Secondary | ICD-10-CM | POA: Diagnosis not present

## 2021-03-28 DIAGNOSIS — J189 Pneumonia, unspecified organism: Secondary | ICD-10-CM | POA: Diagnosis present

## 2021-03-28 DIAGNOSIS — E872 Acidosis, unspecified: Secondary | ICD-10-CM | POA: Diagnosis not present

## 2021-03-28 DIAGNOSIS — E119 Type 2 diabetes mellitus without complications: Secondary | ICD-10-CM | POA: Diagnosis present

## 2021-03-28 DIAGNOSIS — D72829 Elevated white blood cell count, unspecified: Secondary | ICD-10-CM | POA: Diagnosis present

## 2021-03-28 DIAGNOSIS — R4182 Altered mental status, unspecified: Secondary | ICD-10-CM | POA: Diagnosis not present

## 2021-03-28 DIAGNOSIS — Z9289 Personal history of other medical treatment: Secondary | ICD-10-CM

## 2021-03-28 DIAGNOSIS — I69354 Hemiplegia and hemiparesis following cerebral infarction affecting left non-dominant side: Secondary | ICD-10-CM | POA: Diagnosis not present

## 2021-03-28 DIAGNOSIS — A419 Sepsis, unspecified organism: Secondary | ICD-10-CM

## 2021-03-28 DIAGNOSIS — G8194 Hemiplegia, unspecified affecting left nondominant side: Secondary | ICD-10-CM | POA: Diagnosis present

## 2021-03-28 LAB — CBC WITH DIFFERENTIAL/PLATELET
Abs Immature Granulocytes: 0.07 10*3/uL (ref 0.00–0.07)
Basophils Absolute: 0 10*3/uL (ref 0.0–0.1)
Basophils Relative: 0 %
Eosinophils Absolute: 0.1 10*3/uL (ref 0.0–0.5)
Eosinophils Relative: 1 %
HCT: 37.4 % (ref 36.0–46.0)
Hemoglobin: 12.3 g/dL (ref 12.0–15.0)
Immature Granulocytes: 1 %
Lymphocytes Relative: 1 %
Lymphs Abs: 0.1 10*3/uL — ABNORMAL LOW (ref 0.7–4.0)
MCH: 27.9 pg (ref 26.0–34.0)
MCHC: 32.9 g/dL (ref 30.0–36.0)
MCV: 84.8 fL (ref 80.0–100.0)
Monocytes Absolute: 0.3 10*3/uL (ref 0.1–1.0)
Monocytes Relative: 3 %
Neutro Abs: 11.9 10*3/uL — ABNORMAL HIGH (ref 1.7–7.7)
Neutrophils Relative %: 94 %
Platelets: 230 10*3/uL (ref 150–400)
RBC: 4.41 MIL/uL (ref 3.87–5.11)
RDW: 13.5 % (ref 11.5–15.5)
WBC: 12.5 10*3/uL — ABNORMAL HIGH (ref 4.0–10.5)
nRBC: 0 % (ref 0.0–0.2)

## 2021-03-28 LAB — COMPREHENSIVE METABOLIC PANEL
ALT: 36 U/L (ref 0–44)
AST: 48 U/L — ABNORMAL HIGH (ref 15–41)
Albumin: 3.7 g/dL (ref 3.5–5.0)
Alkaline Phosphatase: 69 U/L (ref 38–126)
Anion gap: 12 (ref 5–15)
BUN: 13 mg/dL (ref 8–23)
CO2: 23 mmol/L (ref 22–32)
Calcium: 8.9 mg/dL (ref 8.9–10.3)
Chloride: 103 mmol/L (ref 98–111)
Creatinine, Ser: 1.18 mg/dL — ABNORMAL HIGH (ref 0.44–1.00)
GFR, Estimated: 50 mL/min — ABNORMAL LOW (ref >60)
Glucose, Bld: 235 mg/dL — ABNORMAL HIGH (ref 70–99)
Potassium: 3.6 mmol/L (ref 3.5–5.1)
Sodium: 138 mmol/L (ref 135–145)
Total Bilirubin: 0.4 mg/dL (ref 0.3–1.2)
Total Protein: 7.3 g/dL (ref 6.5–8.1)

## 2021-03-28 LAB — APTT: aPTT: 27 seconds (ref 24–36)

## 2021-03-28 LAB — LACTIC ACID, PLASMA: Lactic Acid, Venous: 3.1 mmol/L (ref 0.5–1.9)

## 2021-03-28 LAB — PROTIME-INR
INR: 1.1 (ref 0.8–1.2)
Prothrombin Time: 13.7 seconds (ref 11.4–15.2)

## 2021-03-28 IMAGING — DX DG CHEST 1V PORT
1 series · 1 of 1 positions shown · non-contrast
Comparison: PA Lat [DATE].

CLINICAL DATA: Sepsis workup.

EXAM:
PORTABLE CHEST 1 VIEW

[chest ap]
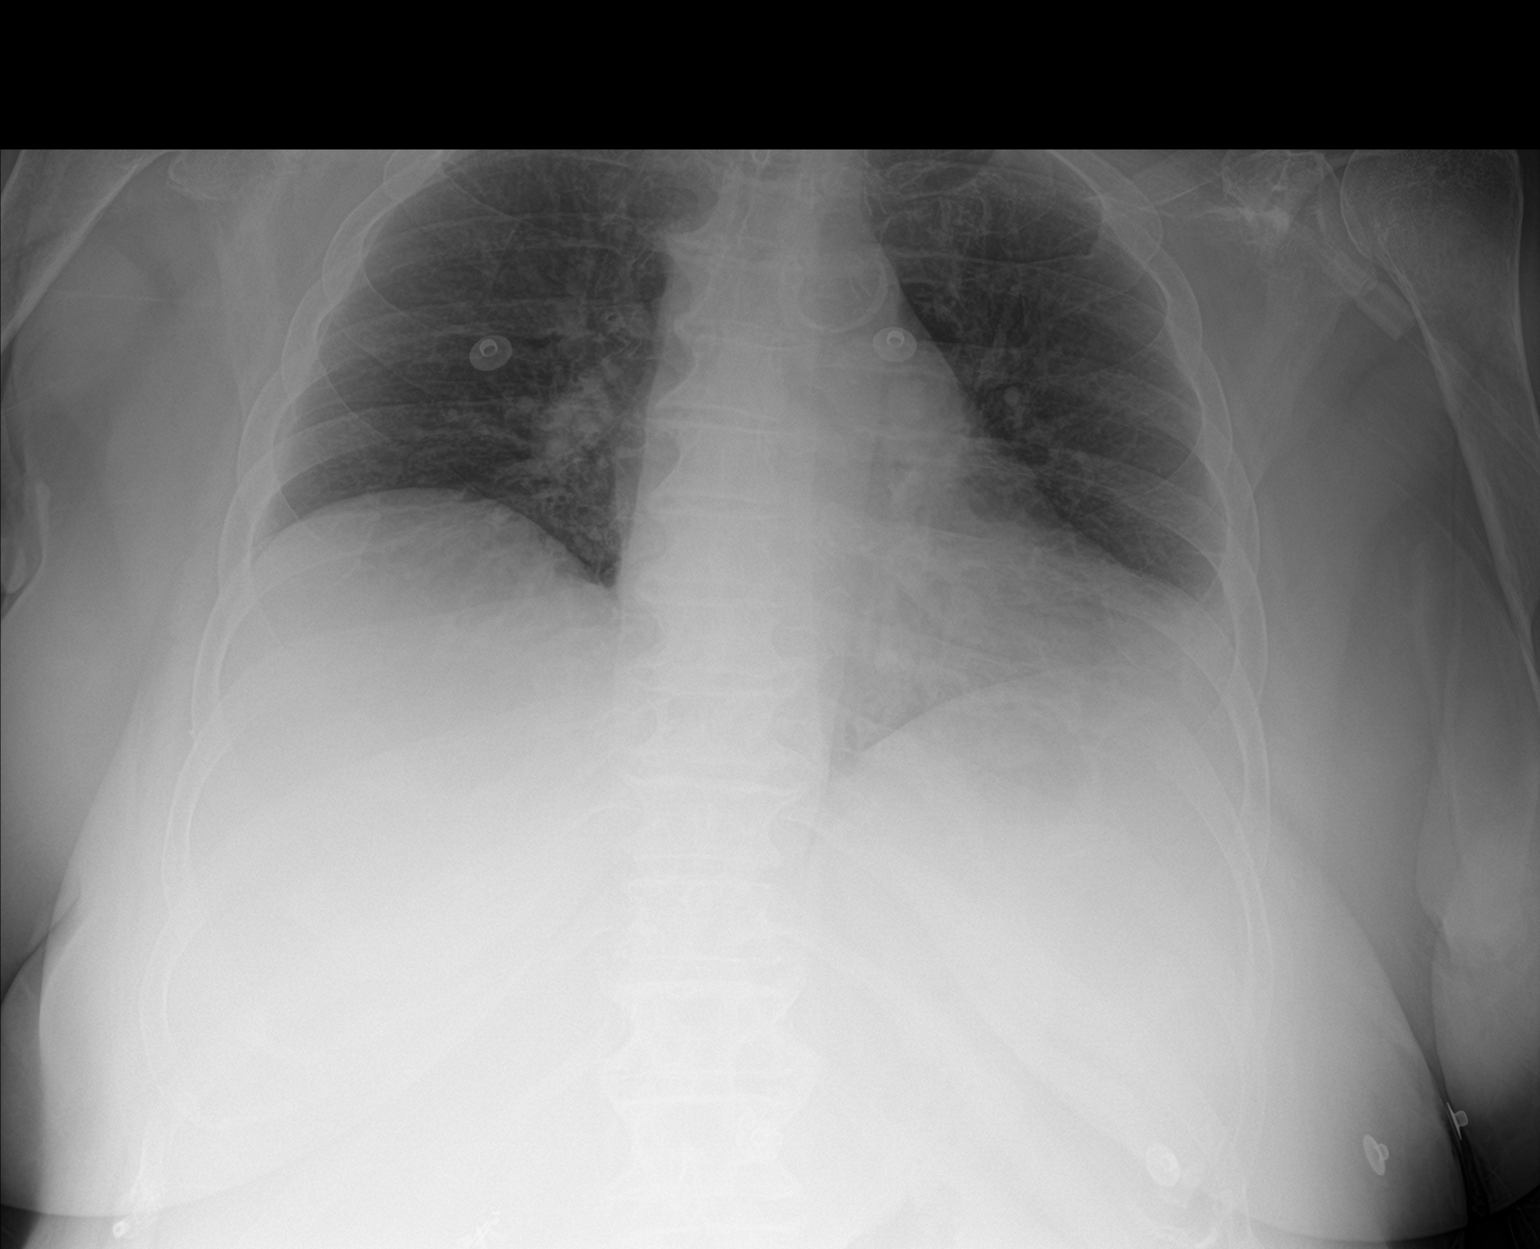

[1 of 1 positions shown; findings below may reference images not displayed]

FINDINGS: The cardiac size is normal. Stable mediastinal configuration with
calcification in the aortic knob. The lungs are hypoexpanded. There
is increased opacity in the lateral left base which could be due to
atelectasis or a small pneumonia.

The remaining hypoinflated lungs are clear with mild elevation right
hemidiaphragm.

No pleural effusion is seen. Thoracic spondylosis and degenerative
disc disease.
IMPRESSION: Limited exam due to low inspiration. Questionable increased opacity
lateral left base, could be atelectasis or pneumonia. PA Lat study
in full inspiration recommended. Aortic atherosclerosis.

## 2021-03-28 MED ORDER — SODIUM CHLORIDE 0.9 % IV BOLUS (SEPSIS)
1000.0000 mL | Freq: Once | INTRAVENOUS | Status: AC
  Administered 2021-03-28: 1000 mL via INTRAVENOUS

## 2021-03-28 MED ORDER — ACETAMINOPHEN 325 MG PO TABS
650.0000 mg | ORAL_TABLET | Freq: Four times a day (QID) | ORAL | Status: DC | PRN
  Administered 2021-03-29: 650 mg via ORAL
  Filled 2021-03-28: qty 2

## 2021-03-28 MED ORDER — SODIUM CHLORIDE 0.9 % IV SOLN
1000.0000 mL | INTRAVENOUS | Status: DC
  Administered 2021-03-28 – 2021-03-29 (×2): 1000 mL via INTRAVENOUS

## 2021-03-28 MED ORDER — MEROPENEM 1 G IV SOLR
1.0000 g | Freq: Once | INTRAVENOUS | Status: AC
  Administered 2021-03-28: 1 g via INTRAVENOUS
  Filled 2021-03-28: qty 20

## 2021-03-28 MED ORDER — ACETAMINOPHEN 500 MG PO TABS
1000.0000 mg | ORAL_TABLET | Freq: Once | ORAL | Status: AC
  Administered 2021-03-28: 1000 mg via ORAL
  Filled 2021-03-28: qty 2

## 2021-03-28 MED ORDER — SODIUM CHLORIDE 0.9 % IV SOLN: 2.0000 g | Freq: Once | INTRAVENOUS | Status: DC

## 2021-03-28 NOTE — ED Notes (Signed)
Notified Dr. Karene Fry of pt's lactic of 3.1.

## 2021-03-28 NOTE — ED Notes (Signed)
ED Provider at bedside. 

## 2021-03-28 NOTE — ED Notes (Signed)
Patient transported to CT 

## 2021-03-28 NOTE — ED Provider Triage Note (Signed)
Emergency Medicine Provider Triage Evaluation Note  Lindsay Rice , a 71 y.o. female  was evaluated in triage.  Pt complains of sudden onset of weakness, headache, fever, lethargy, and AMS.  EMS reports that family stated this happened at approximately 1600.  Patient has history of recent CVA with left-sided deficits.  Patient is alert to person, place, and time.  Endorses fever and cough at this time.  Review of Systems  Positive: Fever, chills, nausea, vomiting Negative: Abdominal pain, constipation, diarrhea, dysuria, hematuria, urinary urgency  Physical Exam  BP (!) 158/75 (BP Location: Right Arm)    Pulse (!) 127    Temp (!) 102.9 F (39.4 C) (Oral)    Resp 15    SpO2 93%  Gen:   Awake, no distress   Resp:  Normal effort MSK:   Moves extremities without difficulty Other:  No facial asymmetry or dysarthria.  Grip strength equal.  Patient able to lift bilateral legs against gravity however cannot hold on there.  Abdomen soft, nondistended, nontender.  Medical Decision Making  Medically screening exam initiated at 8:37 PM.  Appropriate orders placed.  Lindsay Rice was informed that the remainder of the evaluation will be completed by another provider, this initial triage assessment does not replace that evaluation, and the importance of remaining in the ED until their evaluation is complete.  Patient is found to be febrile and tachycardic concern for sepsis.  ED evolving sepsis work-up initiated.  Patient will be made level 2.   Loni Beckwith, [place] 03/28/21 2040

## 2021-03-28 NOTE — ED Provider Notes (Addendum)
Patient is a 71 year old female whose care was transferred to me at shift change from [facility].  Her HPI as below:  Pt diagnosed with a uti today and given and rx for macrobid.  Pt reports she has not taken because it was sent to the wrong pharmacy.  Pt reports she began feeling weak tonight.  Pt has had a stroke and has left sided weakness.  Pt reports no new area of weakness but feels more weak overall.  Physical Exam  BP (!) 117/56    Pulse (!) 110    Temp (S) (!) 102.4 F (39.1 C) (Oral)    Resp (!) 24    SpO2 98%   Physical Exam Vitals and nursing note reviewed.  Constitutional:      General: She is not in acute distress.    Appearance: Normal appearance. She is not ill-appearing, toxic-appearing or diaphoretic.  HENT:     Head: Normocephalic and atraumatic.     Right Ear: External ear normal.     Left Ear: External ear normal.     Nose: Nose normal.     Mouth/Throat:     Mouth: Mucous membranes are moist.     Pharynx: Oropharynx is clear. No oropharyngeal exudate or posterior oropharyngeal erythema.  Eyes:     General: No scleral icterus.       Right eye: No discharge.        Left eye: No discharge.     Extraocular Movements: Extraocular movements intact.     Conjunctiva/sclera: Conjunctivae normal.  Cardiovascular:     Rate and Rhythm: Regular rhythm. Tachycardia present.     Pulses: Normal pulses.     Heart sounds: Normal heart sounds. No murmur heard.   No friction rub. No gallop.  Pulmonary:     Effort: Pulmonary effort is normal. No respiratory distress.     Breath sounds: Normal breath sounds. No stridor. No wheezing, rhonchi or rales.  Abdominal:     General: Abdomen is flat.     Palpations: Abdomen is soft.     Tenderness: There is abdominal tenderness.     Comments: Protuberant abdomen that is soft.  Mild tenderness noted in the left lower quadrant.  Musculoskeletal:        General: Normal range of motion.     Cervical back: Normal range of motion and neck  supple. No tenderness.  Skin:    General: Skin is warm and dry.  Neurological:     Mental Status: She is alert and oriented to person, place, and time.     Comments: A&O x3.  Speaking clearly and coherently.  Following commands.  Psychiatric:        Mood and Affect: Mood normal.        Behavior: Behavior normal.   Procedures  Procedures  ED Course / MDM    Medical Decision Making Amount and/or Complexity of Data Reviewed Radiology: ordered.  Risk OTC drugs. Prescription drug management. Decision regarding hospitalization.  Patient is a 71 year old female whose care was transferred to me at shift change from prior PA-C.  Please see her note for additional information.  In summary, patient recently diagnosed with a UTI and started on Macrobid.  She was unable to begin this medication and today became more altered and began developing a fever.  Lab work resulted showing a CBC with a leukocytosis of 12.5 and a neutrophilia of 11.9.  CMP with a glucose of 235, creatinine of 1.18, GFR 50.  Lactic acidosis  of 3.1 with a repeat of 2.3 after IV fluid bolus.  At the time of shift change patient was pending a UA.  Patient received meropenem and UA does not appear infectious at this time.  Initial chest x-ray portable 1 view was concerning for a possible left lower pneumonia but on repeat lateral this was not seen.  On my exam patient has mild tenderness in the left lower quadrant.  Denying any diarrhea but does note some nausea and vomiting earlier today.  Given the unknown source obtained CT scans of the chest, abdomen, and pelvis with findings as noted below:  IMPRESSION:  No acute cardiopulmonary disease.     Bibasilar atelectasis.     Aortic atherosclerosis.     Hepatic steatosis.     Punctate left lower pole nephrolithiasis.   Unsure the source of the patient's fever.  CT scans appear reassuring.  Respiratory panel is negative.  UA today does not appear infectious.  Urine culture  pending.  Blood cultures also pending.  Patient's lactic acidosis appears to be improving after IV fluids.  Appears oriented on my exam.  Given patient's age, comorbidities, feel that she will require admission for her altered mental status as well as fever of unknown origin.  We will discuss with medicine team.     Placido Sou, PA-C 03/29/21 0236    Placido Sou, PA-C 03/29/21 3716    Marily Memos, MD 03/29/21 [id number]

## 2021-03-28 NOTE — ED Triage Notes (Signed)
Pt via [facility], was sitting w husband & sudden onset weakness, HA, fever, lethargy & AMS, emesis x1 approx 1600. Hx Cdiff, CVA December, residual L sided deficits. No new deficits. Stroke screen clear w EMS  200/80 -> 160/92 16G L fa CBG 278, hx dm HR 120 102.74F 90% on RA, 94% on 4L

## 2021-03-28 NOTE — ED Provider Notes (Signed)
[facility] EMERGENCY DEPARTMENT Provider Note   CSN: [id number] Arrival date & time: 03/28/21  2018     History  Chief Complaint  Patient presents with   Altered Mental Status   Weakness    Lindsay Rice is a 71 y.o. female.  Pt diagnosed with a uti today and given and rx for macrobid.  Pt reports she has not taken because it was sent to the wrong pharmacy.  Pt reports she began feeling weak tonight.  Pt has had a stroke and has left sided weakness.  Pt reports no new area of weakness but feels more weak overall.   The history is provided by the patient. No language interpreter was used.  Altered Mental Status Presenting symptoms: confusion   Most recent episode:  Today Episode history:  Single Duration:  1 day Timing:  Constant Progression:  Worsening Chronicity:  New Associated symptoms: abdominal pain and weakness   Weakness Associated symptoms: abdominal pain       Home Medications Prior to Admission medications   Medication Sig Start Date End Date Taking? Authorizing Provider  Accu-Chek Softclix Lancets lancets Use as instructed 03/10/21   Lovorn, Jinny Blossom, MD  albuterol (VENTOLIN HFA) 108 (90 Base) MCG/ACT inhaler Inhale 2 puffs into the lungs every 6 (six) hours as needed for wheezing or shortness of breath.    [provider]  amitriptyline (ELAVIL) 25 MG tablet Take 25 mg by mouth at bedtime.    [provider]  amLODipine (NORVASC) 10 MG tablet Take 1 tablet (10 mg total) by mouth daily. 03/10/21   Lovorn, Jinny Blossom, MD  aspirin 81 MG chewable tablet Chew 1 tablet (81 mg total) by mouth daily. Till 03/03/21 then stop taking it. Patient not taking: Reported on 03/10/2021 02/21/21   Love, Ivan Anchors, PA-C  atorvastatin (LIPITOR) 80 MG tablet Take by mouth.    [provider]  blood glucose meter kit and supplies KIT Dispense based on patient and insurance preference. Use up to four times daily as directed. 02/21/21   Love, Ivan Anchors,  PA-C  busPIRone (BUSPAR) 5 MG tablet Take by mouth.    [provider]  butalbital-acetaminophen-caffeine (FIORICET) 50-325-40 MG tablet Take 1 tablet by mouth in the morning and at bedtime. Patient not taking: Reported on 03/10/2021    [provider]  clopidogrel (PLAVIX) 75 MG tablet Take 1 tablet (75 mg total) by mouth daily. 02/21/21   Love, Ivan Anchors, PA-C  diclofenac Sodium (VOLTAREN) 1 % GEL Apply 2 g topically 3 (three) times daily. To right shoulder 02/21/21   Love, Ivan Anchors, PA-C  divalproex (DEPAKOTE ER) 500 MG 24 hr tablet Take 500 mg by mouth at bedtime.    [provider]  empagliflozin (JARDIANCE) 25 MG TABS tablet Take 1 tablet (25 mg total) by mouth daily. 03/10/21   Lovorn, Jinny Blossom, MD  EPINEPHrine 0.3 mg/0.3 mL IJ SOAJ injection Inject 0.3 mg into the muscle once as needed for anaphylaxis. 11/21/20   [provider]  famotidine (PEPCID) 40 MG tablet Take 1 tablet (40 mg total) by mouth at bedtime. 02/21/21   Love, Ivan Anchors, PA-C  fluticasone-salmeterol (ADVAIR HFA) 794-80 MCG/ACT inhaler Inhale 2 puffs into the lungs 2 (two) times daily as needed (for cold-induced asthma symptoms).    [provider]  Fremanezumab-vfrm (AJOVY) 225 MG/1.5ML SOSY Inject 225 mg into the skin every 30 (thirty) days.    [provider]  gabapentin (NEURONTIN) 300 MG capsule Take 300  mg by mouth in the morning and at bedtime.    [provider]  glimepiride (AMARYL) 2 MG tablet Take 1 tablet (2 mg total) by mouth daily with breakfast. 02/21/21   Love, Ivan Anchors, PA-C  glucose blood (COOL BLOOD GLUCOSE TEST STRIPS) test strip Use as instructed 03/10/21   Lovorn, Jinny Blossom, MD  levothyroxine (SYNTHROID) 75 MCG tablet Take 75 mcg by mouth daily before breakfast.    [provider]  methocarbamol (ROBAXIN) 500 MG tablet Take 1 tablet (500 mg total) by mouth every 8 (eight) hours as needed for muscle spasms. 03/10/21   Lovorn, Jinny Blossom, MD  ondansetron  (ZOFRAN-ODT) 4 MG disintegrating tablet Take 4 mg by mouth every 8 (eight) hours as needed for nausea or vomiting (dissolve orally). Patient not taking: Reported on 03/10/2021    [provider]  promethazine (PHENERGAN) 25 MG suppository Place 25 mg rectally every 6 (six) hours as needed for nausea or vomiting. Patient not taking: Reported on 03/10/2021    [provider]  senna-docusate (SENOKOT-S) 8.6-50 MG tablet Take 1 tablet by mouth at bedtime as needed for moderate constipation or mild constipation. Patient not taking: Reported on 03/10/2021 02/10/21   de Yolanda Manges, Cortney E, NP  sertraline (ZOLOFT) 100 MG tablet Take 50-100 mg by mouth See admin instructions. Take 100 mg by mouth in the morning 50 mg at bedtime    [provider]  triamcinolone cream (KENALOG) 0.1 % Apply 1 application topically 2 (two) times daily as needed (to affected areas).    [provider]      Allergies    Iodinated contrast media, Iodine, Shellfish-derived products, Cefaclor, Cheese, Covid-19 (mrna) vaccine, Levofloxacin, Penicillins, and Wound dressing adhesive    Review of Systems   Review of Systems  Gastrointestinal:  Positive for abdominal pain.  Neurological:  Positive for weakness.  Psychiatric/Behavioral:  Positive for confusion.   All other systems reviewed and are negative.  Physical Exam Updated Vital Signs BP (!) 113/54    Pulse (!) 113    Temp (S) (!) 102.4 F (39.1 C) (Oral)    Resp (!) 29    SpO2 96%  Physical Exam Vitals and nursing note reviewed.  Constitutional:      Appearance: She is well-developed.  HENT:     Head: Normocephalic.     Right Ear: Tympanic membrane normal.     Left Ear: Tympanic membrane normal.     Nose: Nose normal.     Mouth/Throat:     Mouth: Mucous membranes are moist.  Eyes:     Extraocular Movements: Extraocular movements intact.     Pupils: Pupils are equal, round, and reactive to light.  Cardiovascular:     Rate and  Rhythm: Normal rate.  Pulmonary:     Effort: Pulmonary effort is normal.  Abdominal:     General: Abdomen is flat. There is no distension.  Musculoskeletal:        General: Normal range of motion.     Cervical back: Normal range of motion.  Skin:    General: Skin is warm.  Neurological:     General: No focal deficit present.     Mental Status: She is alert and oriented to person, place, and time.  Psychiatric:        Mood and Affect: Mood normal.    ED Results / Procedures / Treatments   Labs (all labs ordered are listed, but only abnormal results are displayed) Labs Reviewed  LACTIC ACID,  PLASMA - Abnormal; Notable for the following components:      Result Value   Lactic Acid, Venous 3.1 (*)    All other components within normal limits  COMPREHENSIVE METABOLIC PANEL - Abnormal; Notable for the following components:   Glucose, Bld 235 (*)    Creatinine, Ser 1.18 (*)    AST 48 (*)    GFR, Estimated 50 (*)    All other components within normal limits  CBC WITH DIFFERENTIAL/PLATELET - Abnormal; Notable for the following components:   WBC 12.5 (*)    Neutro Abs 11.9 (*)    Lymphs Abs 0.1 (*)    All other components within normal limits  CULTURE, BLOOD (ROUTINE X 2)  CULTURE, BLOOD (ROUTINE X 2)  URINE CULTURE  RESP PANEL BY RT-PCR (FLU A&B, COVID) ARPGX2  PROTIME-INR  APTT  LACTIC ACID, PLASMA  URINALYSIS, ROUTINE W REFLEX MICROSCOPIC    EKG None  Radiology DG Chest Port 1 View  Result Date: 03/28/2021 CLINICAL DATA:  Sepsis workup. EXAM: PORTABLE CHEST 1 VIEW COMPARISON:  PA Lat 04/19/2017. FINDINGS: The cardiac size is normal. Stable mediastinal configuration with calcification in the aortic knob. The lungs are hypoexpanded. There is increased opacity in the lateral left base which could be due to atelectasis or a small pneumonia. The remaining hypoinflated lungs are clear with mild elevation right hemidiaphragm. No pleural effusion is seen. Thoracic spondylosis and  degenerative disc disease. IMPRESSION: Limited exam due to low inspiration. Questionable increased opacity lateral left base, could be atelectasis or pneumonia. PA Lat study in full inspiration recommended. Aortic atherosclerosis. Electronically Signed   By: Telford Nab M.D.   On: 03/28/2021 21:35    Procedures Procedures    Medications Ordered in ED Medications  sodium chloride 0.9 % bolus 1,000 mL (1,000 mLs Intravenous New Bag/Given 03/28/21 2225)    Followed by  sodium chloride 0.9 % bolus 1,000 mL (1,000 mLs Intravenous New Bag/Given 03/28/21 2227)    Followed by  0.9 %  sodium chloride infusion (1,000 mLs Intravenous New Bag/Given 03/28/21 2230)  acetaminophen (TYLENOL) tablet 650 mg (has no administration in time range)  meropenem (MERREM) 1 g in sodium chloride 0.9 % 100 mL IVPB (has no administration in time range)  acetaminophen (TYLENOL) tablet 1,000 mg (1,000 mg Oral Given 03/28/21 2056)    ED Course/ Medical Decision Making/ A&P                           Medical Decision Making Problems Addressed: Fever, unspecified fever cause: acute illness or injury    Details: Fever today Sepsis, due to unspecified organism, unspecified whether acute organ dysfunction present [facility]): acute illness or injury    Details: fever, general weakness and uti symptoms  Amount and/or Complexity of Data Reviewed Independent Historian:     Details: Family member here with pt External Data Reviewed: notes.    Details: Notes from [place] in December [place],  Neurology records reviewed Labs: ordered. Decision-making details documented in ED Course.    Details: Pt's lactic acid is 3.1 Radiology: ordered and independent interpretation performed. Decision-making details documented in ED Course.    Details: Chest xray shows possible pneumonia ECG/medicine tests: ordered and independent interpretation performed. Decision-making details documented in ED Course.  Risk Prescription drug  management. Decision regarding hospitalization.           Final Clinical Impression(s) / ED Diagnoses Final diagnoses:  Sepsis, due to unspecified organism, unspecified whether  acute organ dysfunction present (HCC)  Fever, unspecified fever cause    Rx / DC Orders ED Discharge Orders     None     Pt's care turned over to [facility].  U a pending.  Plan admission for IV antibiotics    Sidney Ace 03/28/21 2351    Regan Lemming, MD 03/29/21 1320

## 2021-03-29 ENCOUNTER — Other Ambulatory Visit (HOSPITAL_COMMUNITY): Payer: Medicare PPO

## 2021-03-29 ENCOUNTER — Emergency Department (HOSPITAL_COMMUNITY): Payer: Medicare Other

## 2021-03-29 ENCOUNTER — Inpatient Hospital Stay (HOSPITAL_COMMUNITY): Payer: Medicare Other

## 2021-03-29 ENCOUNTER — Encounter (HOSPITAL_COMMUNITY): Payer: Self-pay | Admitting: Internal Medicine

## 2021-03-29 ENCOUNTER — Other Ambulatory Visit: Payer: Self-pay

## 2021-03-29 DIAGNOSIS — Z20822 Contact with and (suspected) exposure to covid-19: Secondary | ICD-10-CM | POA: Diagnosis not present

## 2021-03-29 DIAGNOSIS — Z7902 Long term (current) use of antithrombotics/antiplatelets: Secondary | ICD-10-CM | POA: Diagnosis not present

## 2021-03-29 DIAGNOSIS — E119 Type 2 diabetes mellitus without complications: Secondary | ICD-10-CM | POA: Diagnosis not present

## 2021-03-29 DIAGNOSIS — Z880 Allergy status to penicillin: Secondary | ICD-10-CM | POA: Diagnosis not present

## 2021-03-29 DIAGNOSIS — G8929 Other chronic pain: Secondary | ICD-10-CM

## 2021-03-29 DIAGNOSIS — E039 Hypothyroidism, unspecified: Secondary | ICD-10-CM

## 2021-03-29 DIAGNOSIS — J189 Pneumonia, unspecified organism: Secondary | ICD-10-CM | POA: Diagnosis present

## 2021-03-29 DIAGNOSIS — R509 Fever, unspecified: Secondary | ICD-10-CM

## 2021-03-29 DIAGNOSIS — R6520 Severe sepsis without septic shock: Secondary | ICD-10-CM

## 2021-03-29 DIAGNOSIS — R000 Tachycardia, unspecified: Secondary | ICD-10-CM | POA: Diagnosis not present

## 2021-03-29 DIAGNOSIS — E1149 Type 2 diabetes mellitus with other diabetic neurological complication: Secondary | ICD-10-CM | POA: Diagnosis not present

## 2021-03-29 DIAGNOSIS — G031 Chronic meningitis: Secondary | ICD-10-CM | POA: Diagnosis not present

## 2021-03-29 DIAGNOSIS — E669 Obesity, unspecified: Secondary | ICD-10-CM | POA: Diagnosis not present

## 2021-03-29 DIAGNOSIS — R519 Headache, unspecified: Secondary | ICD-10-CM | POA: Diagnosis not present

## 2021-03-29 DIAGNOSIS — I69354 Hemiplegia and hemiparesis following cerebral infarction affecting left non-dominant side: Secondary | ICD-10-CM | POA: Diagnosis not present

## 2021-03-29 DIAGNOSIS — A419 Sepsis, unspecified organism: Secondary | ICD-10-CM

## 2021-03-29 DIAGNOSIS — E872 Acidosis, unspecified: Secondary | ICD-10-CM | POA: Diagnosis not present

## 2021-03-29 DIAGNOSIS — Z79899 Other long term (current) drug therapy: Secondary | ICD-10-CM | POA: Diagnosis not present

## 2021-03-29 DIAGNOSIS — I10 Essential (primary) hypertension: Secondary | ICD-10-CM | POA: Diagnosis not present

## 2021-03-29 DIAGNOSIS — Z7951 Long term (current) use of inhaled steroids: Secondary | ICD-10-CM | POA: Diagnosis not present

## 2021-03-29 DIAGNOSIS — Z6833 Body mass index (BMI) 33.0-33.9, adult: Secondary | ICD-10-CM | POA: Diagnosis not present

## 2021-03-29 DIAGNOSIS — Z8661 Personal history of infections of the central nervous system: Secondary | ICD-10-CM | POA: Diagnosis not present

## 2021-03-29 DIAGNOSIS — G43909 Migraine, unspecified, not intractable, without status migrainosus: Secondary | ICD-10-CM | POA: Diagnosis not present

## 2021-03-29 DIAGNOSIS — D72829 Elevated white blood cell count, unspecified: Secondary | ICD-10-CM | POA: Diagnosis not present

## 2021-03-29 DIAGNOSIS — R0781 Pleurodynia: Secondary | ICD-10-CM | POA: Diagnosis not present

## 2021-03-29 DIAGNOSIS — R0902 Hypoxemia: Secondary | ICD-10-CM | POA: Diagnosis not present

## 2021-03-29 DIAGNOSIS — Z91013 Allergy to seafood: Secondary | ICD-10-CM | POA: Diagnosis not present

## 2021-03-29 DIAGNOSIS — R4182 Altered mental status, unspecified: Secondary | ICD-10-CM | POA: Diagnosis not present

## 2021-03-29 DIAGNOSIS — Z888 Allergy status to other drugs, medicaments and biological substances status: Secondary | ICD-10-CM | POA: Diagnosis not present

## 2021-03-29 DIAGNOSIS — Z881 Allergy status to other antibiotic agents status: Secondary | ICD-10-CM | POA: Diagnosis not present

## 2021-03-29 DIAGNOSIS — Z91041 Radiographic dye allergy status: Secondary | ICD-10-CM | POA: Diagnosis not present

## 2021-03-29 LAB — RESPIRATORY PANEL BY PCR

## 2021-03-29 LAB — URINALYSIS, ROUTINE W REFLEX MICROSCOPIC
Bacteria, UA: NONE SEEN
Bilirubin Urine: NEGATIVE
Glucose, UA: NEGATIVE mg/dL
Ketones, ur: NEGATIVE mg/dL
Leukocytes,Ua: NEGATIVE
Nitrite: NEGATIVE
Protein, ur: NEGATIVE mg/dL
Specific Gravity, Urine: 1.014 (ref 1.005–1.030)
pH: 5 (ref 5.0–8.0)

## 2021-03-29 LAB — RESP PANEL BY RT-PCR (FLU A&B, COVID) ARPGX2
Influenza A by PCR: NEGATIVE
Influenza B by PCR: NEGATIVE
SARS Coronavirus 2 by RT PCR: NEGATIVE

## 2021-03-29 LAB — ECHOCARDIOGRAM COMPLETE
AR max vel: 2.8 cm2
AV Peak grad: 11.2 mmHg
Ao pk vel: 1.67 m/s
Area-P 1/2: 4.54 cm2
Height: 65 in
S' Lateral: 2.4 cm
Weight: 3220.48 oz

## 2021-03-29 LAB — LACTIC ACID, PLASMA
Lactic Acid, Venous: 2.1 mmol/L (ref 0.5–1.9)
Lactic Acid, Venous: 2.3 mmol/L (ref 0.5–1.9)

## 2021-03-29 LAB — GLUCOSE, CAPILLARY
Glucose-Capillary: 91 mg/dL (ref 70–99)
Glucose-Capillary: 133 mg/dL — ABNORMAL HIGH (ref 70–99)

## 2021-03-29 LAB — HEMOGLOBIN A1C
Hgb A1c MFr Bld: 6.7 % — ABNORMAL HIGH (ref 4.8–5.6)
Mean Plasma Glucose: 145.59 mg/dL

## 2021-03-29 LAB — PROCALCITONIN: Procalcitonin: 17.54 ng/mL

## 2021-03-29 LAB — CBG MONITORING, ED
Glucose-Capillary: 189 mg/dL — ABNORMAL HIGH (ref 70–99)
Glucose-Capillary: 156 mg/dL — ABNORMAL HIGH (ref 70–99)

## 2021-03-29 IMAGING — CR DG CHEST 1V
1 series · 1 of 1 positions shown · non-contrast
Comparison: [DATE]

CLINICAL DATA: Pneumonia, weakness, altered mental status

EXAM:
CHEST  1 VIEW

[chest pa]
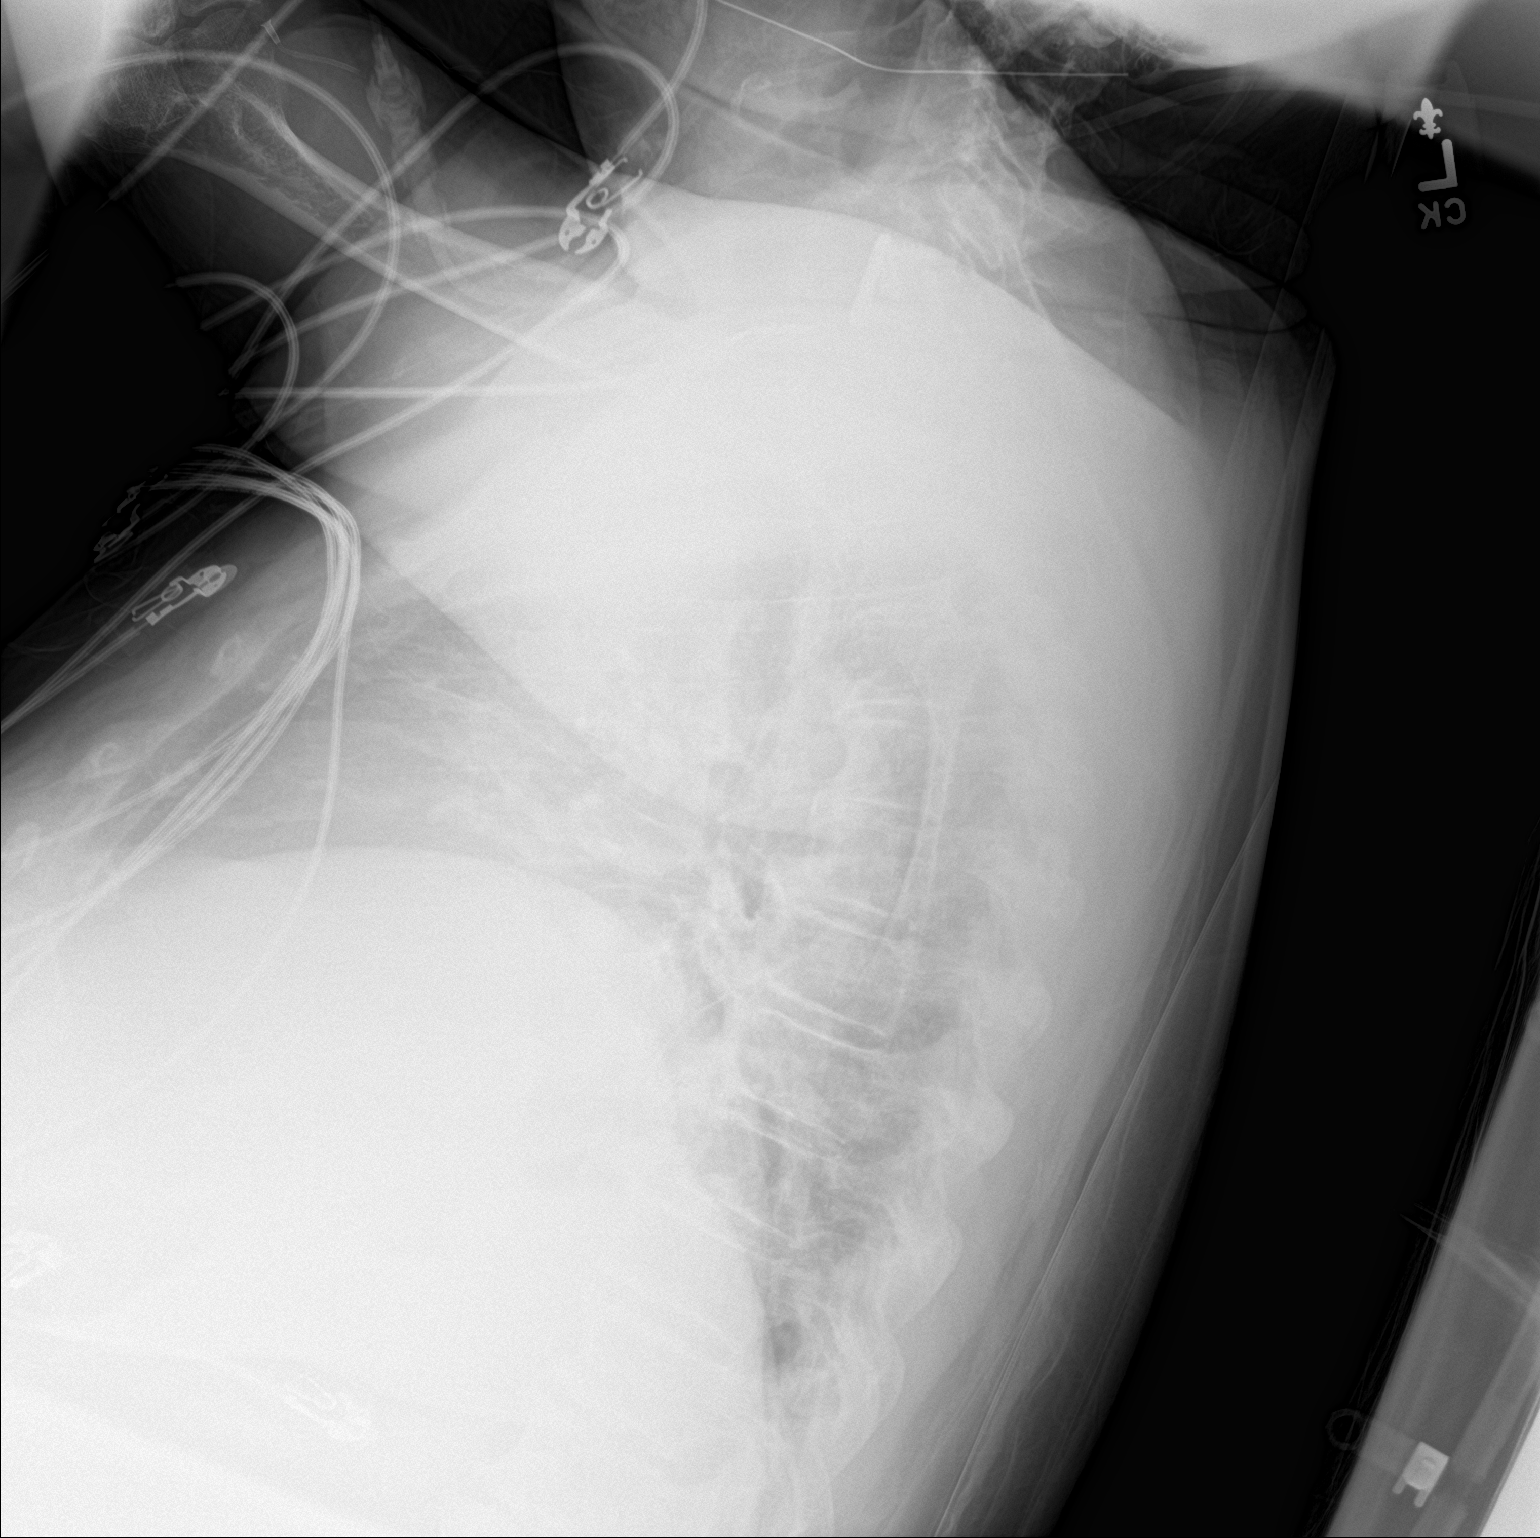

[1 of 1 positions shown; findings below may reference images not displayed]

FINDINGS: Shallow inspiration. Cardiac enlargement. No vascular congestion or
edema. Probable atelectasis in the left base. No pleural effusions.
No pneumothorax. Calcification of the aorta. Degenerative changes in
the spine.
IMPRESSION: Shallow inspiration with atelectasis in the left base. Cardiac
enlargement.

## 2021-03-29 IMAGING — CT CT HEAD W/O CM
4 series · 17 of 47 positions shown, 19 images · non-contrast
Comparison: [DATE]

CLINICAL DATA: Delirium



[Series 3: head wo · axial · 0.40mm/px · z∈[-176,-56]mm · 7 of 34 slices shown, 9 images]
[im 5/34  brain]
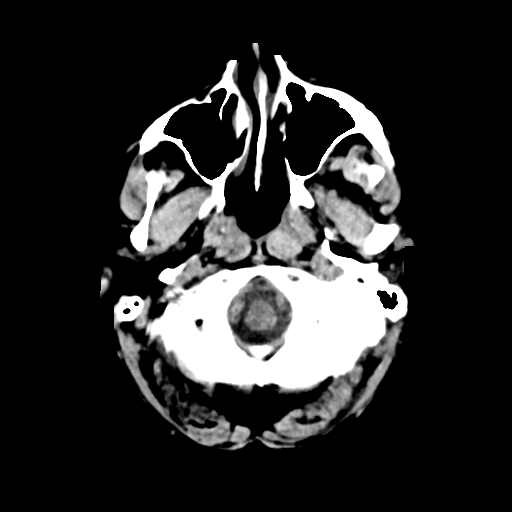
[im 5/34  bone]
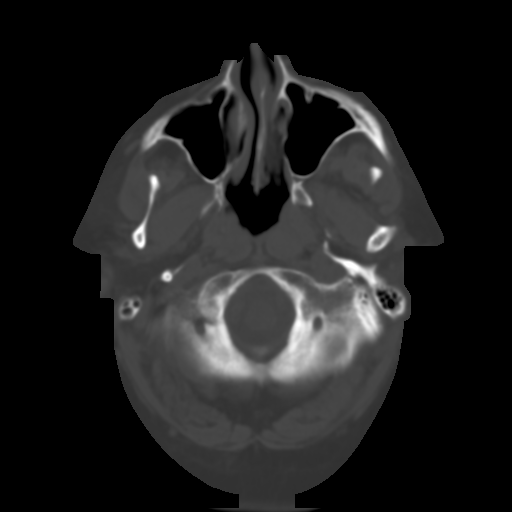
[im 9/34  brain]
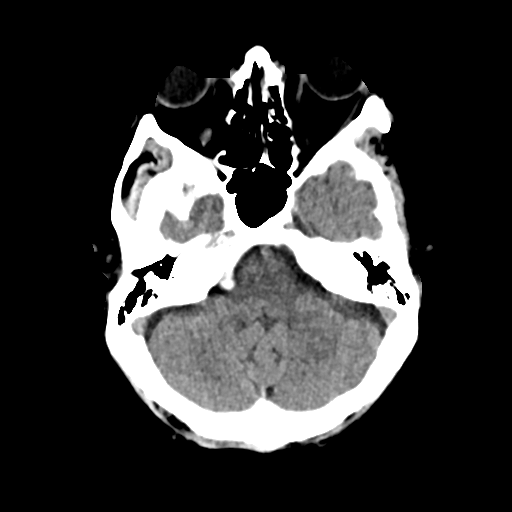
[im 13/34  brain]
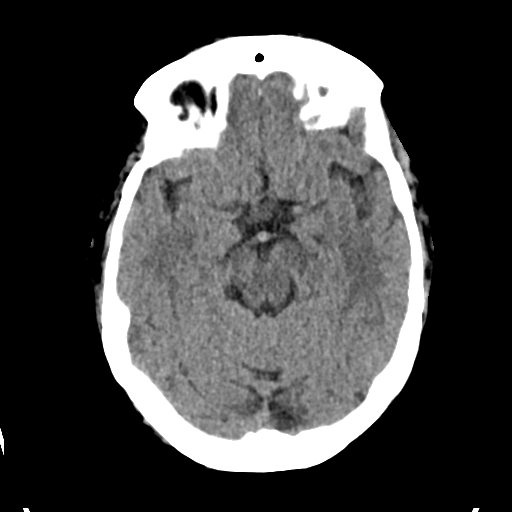
[im 17/34  brain]
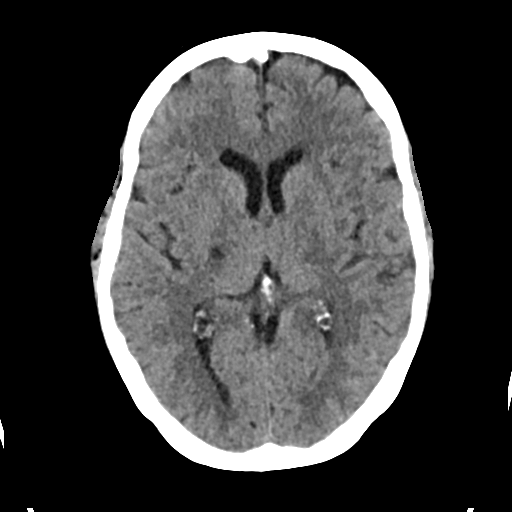
[im 21/34  brain]
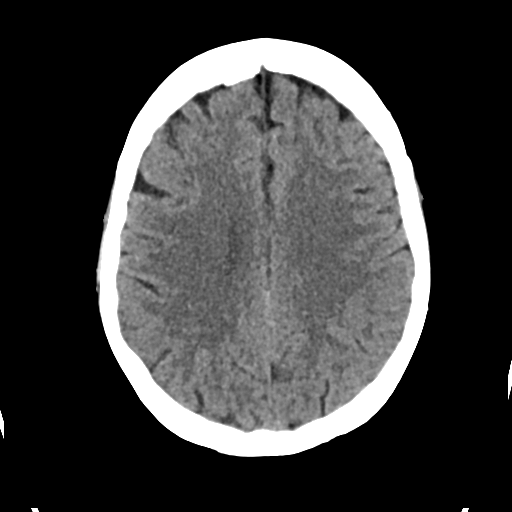
[im 21/34  bone]
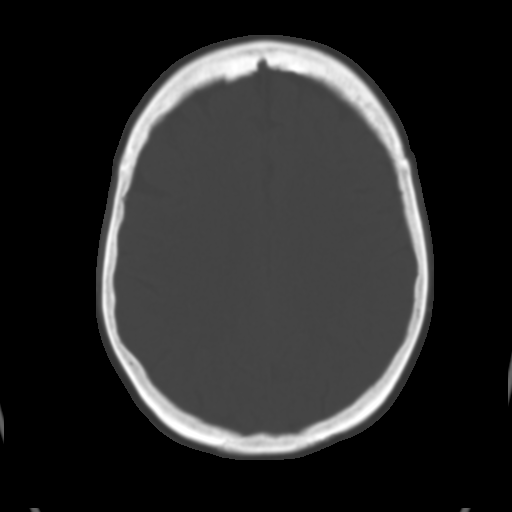
[im 25/34  brain]
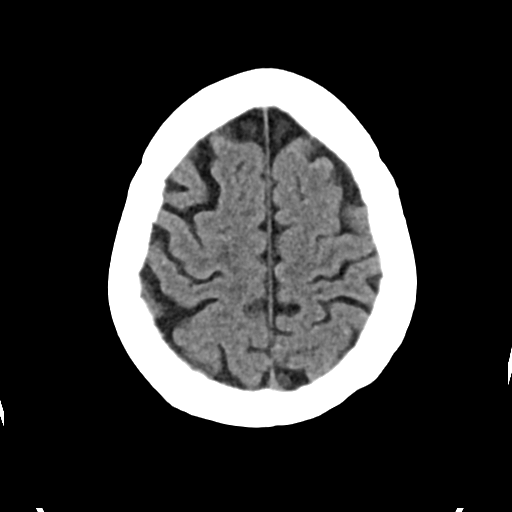
[im 29/34  brain]
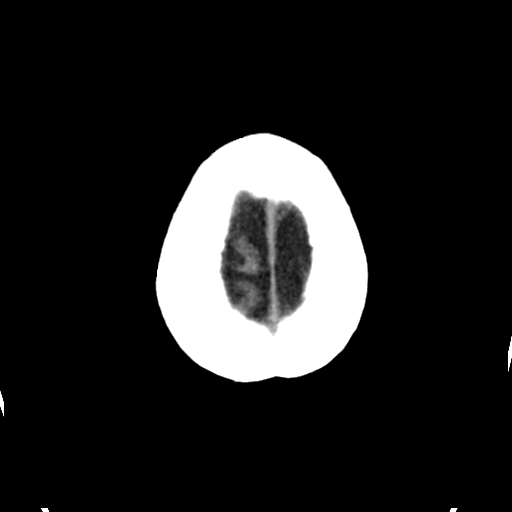

[Series 4: head bone · axial · 0.40mm/px · z∈[-180,-122]mm · 4 of 84 slices shown]
[im 9/84  bone]
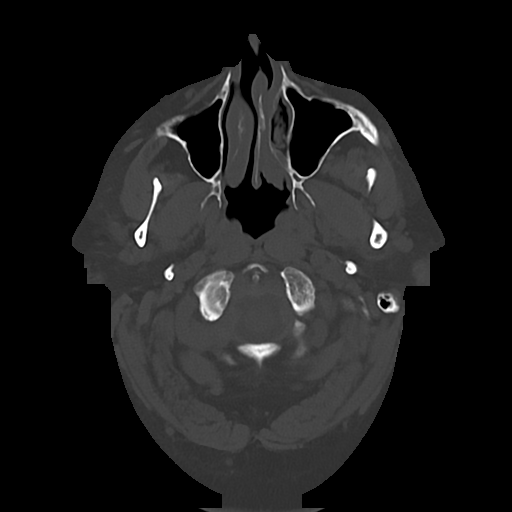
[im 17/84  bone]
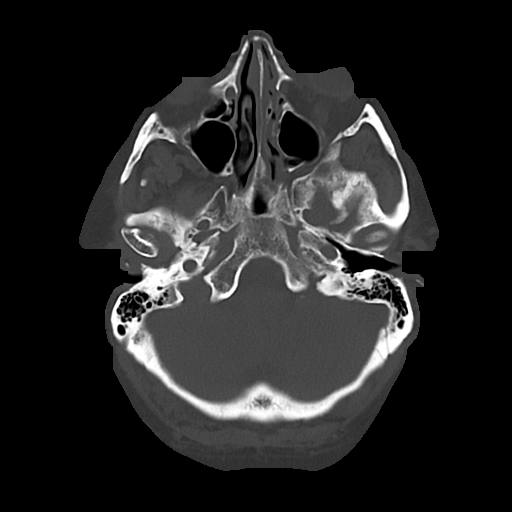
[im 25/84  bone]
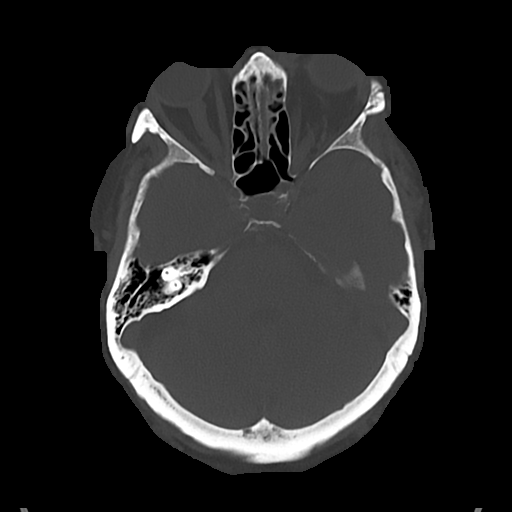
[im 38/84  bone]
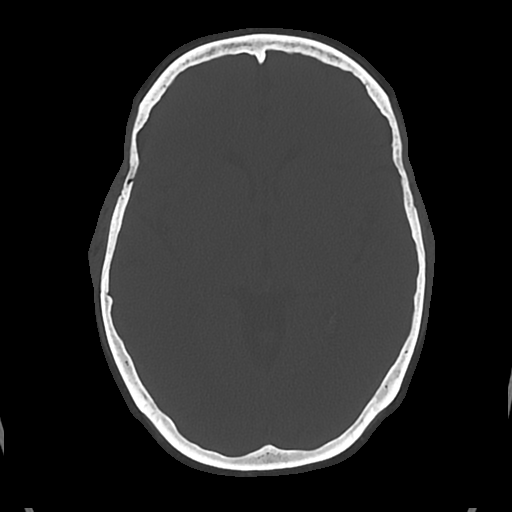

[Series 5: cor soft · coronal · 0.35mm/px · 3 of 70 slices shown]
[im 24/70  brain]
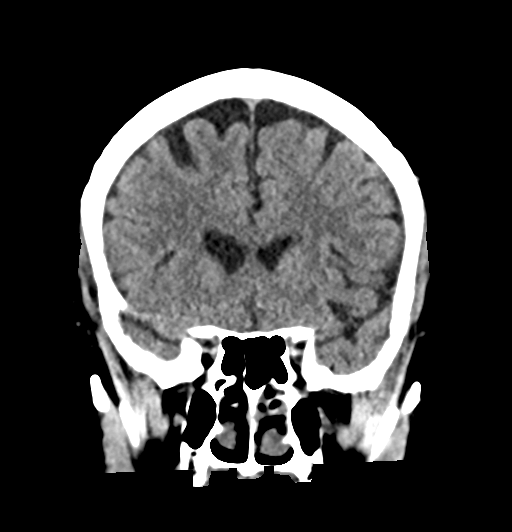
[im 31/70  brain]
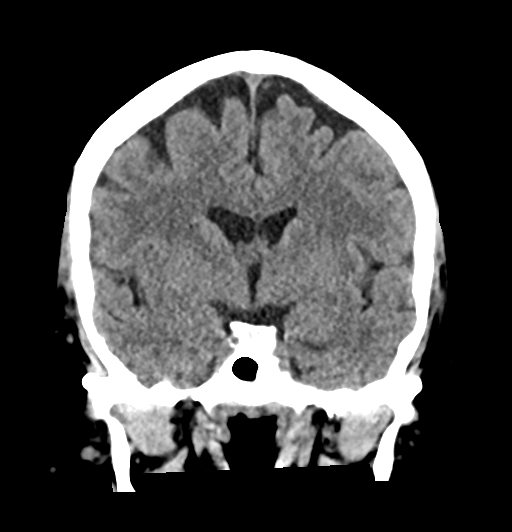
[im 39/70  brain]
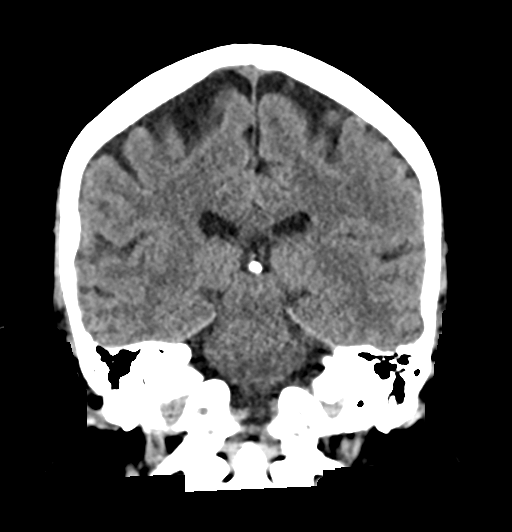

[Series 6: sag soft · sagittal · 0.36mm/px · 3 of 57 slices shown]
[im 19/57  brain]
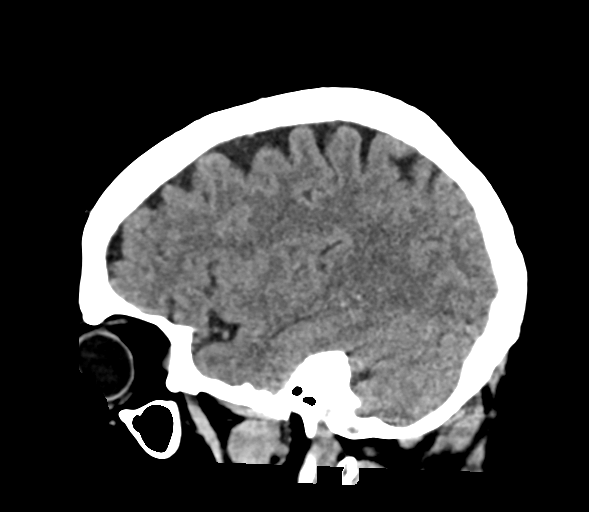
[im 29/57  brain]
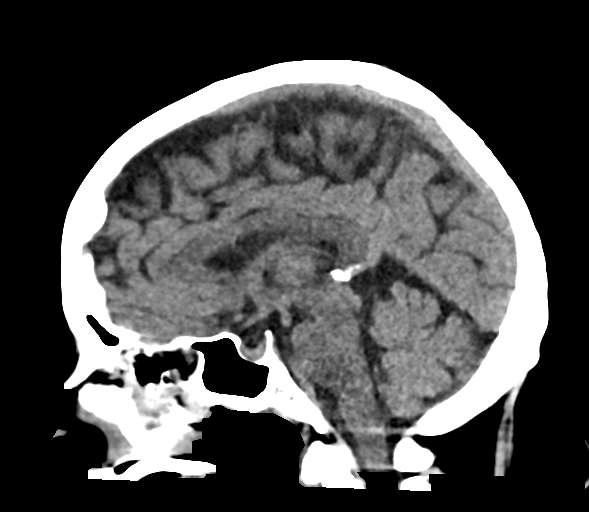
[im 38/57  brain]
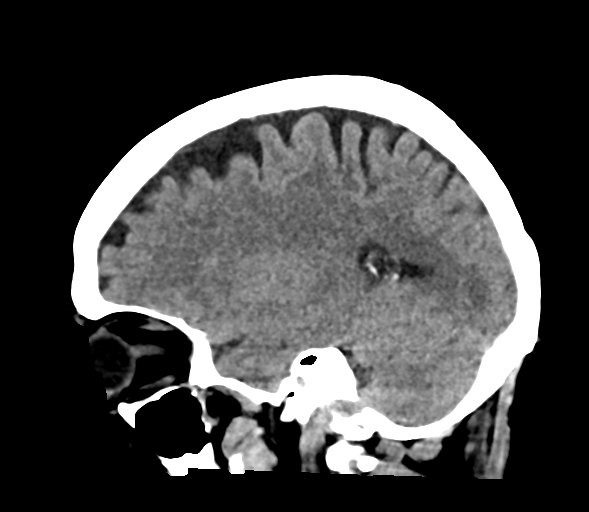

[17 of 47 positions shown; findings below may reference images not displayed]

FINDINGS: Brain: Old right internal capsule lacunar infarct, unchanged. No
acute intracranial abnormality. Specifically, no hemorrhage,
hydrocephalus, mass lesion, acute infarction, or significant
intracranial injury.

Vascular: No hyperdense vessel or unexpected calcification.

Skull: No acute calvarial abnormality.

Sinuses/Orbits: No acute findings

Other: None
IMPRESSION: No acute intracranial abnormality.

## 2021-03-29 IMAGING — CR DG CHEST 1V
1 series · 1 of 1 positions shown · non-contrast
Comparison: [DATE]

CLINICAL DATA: Pneumonia, weakness, altered mental status

EXAM:
CHEST  1 VIEW

[chest ap]
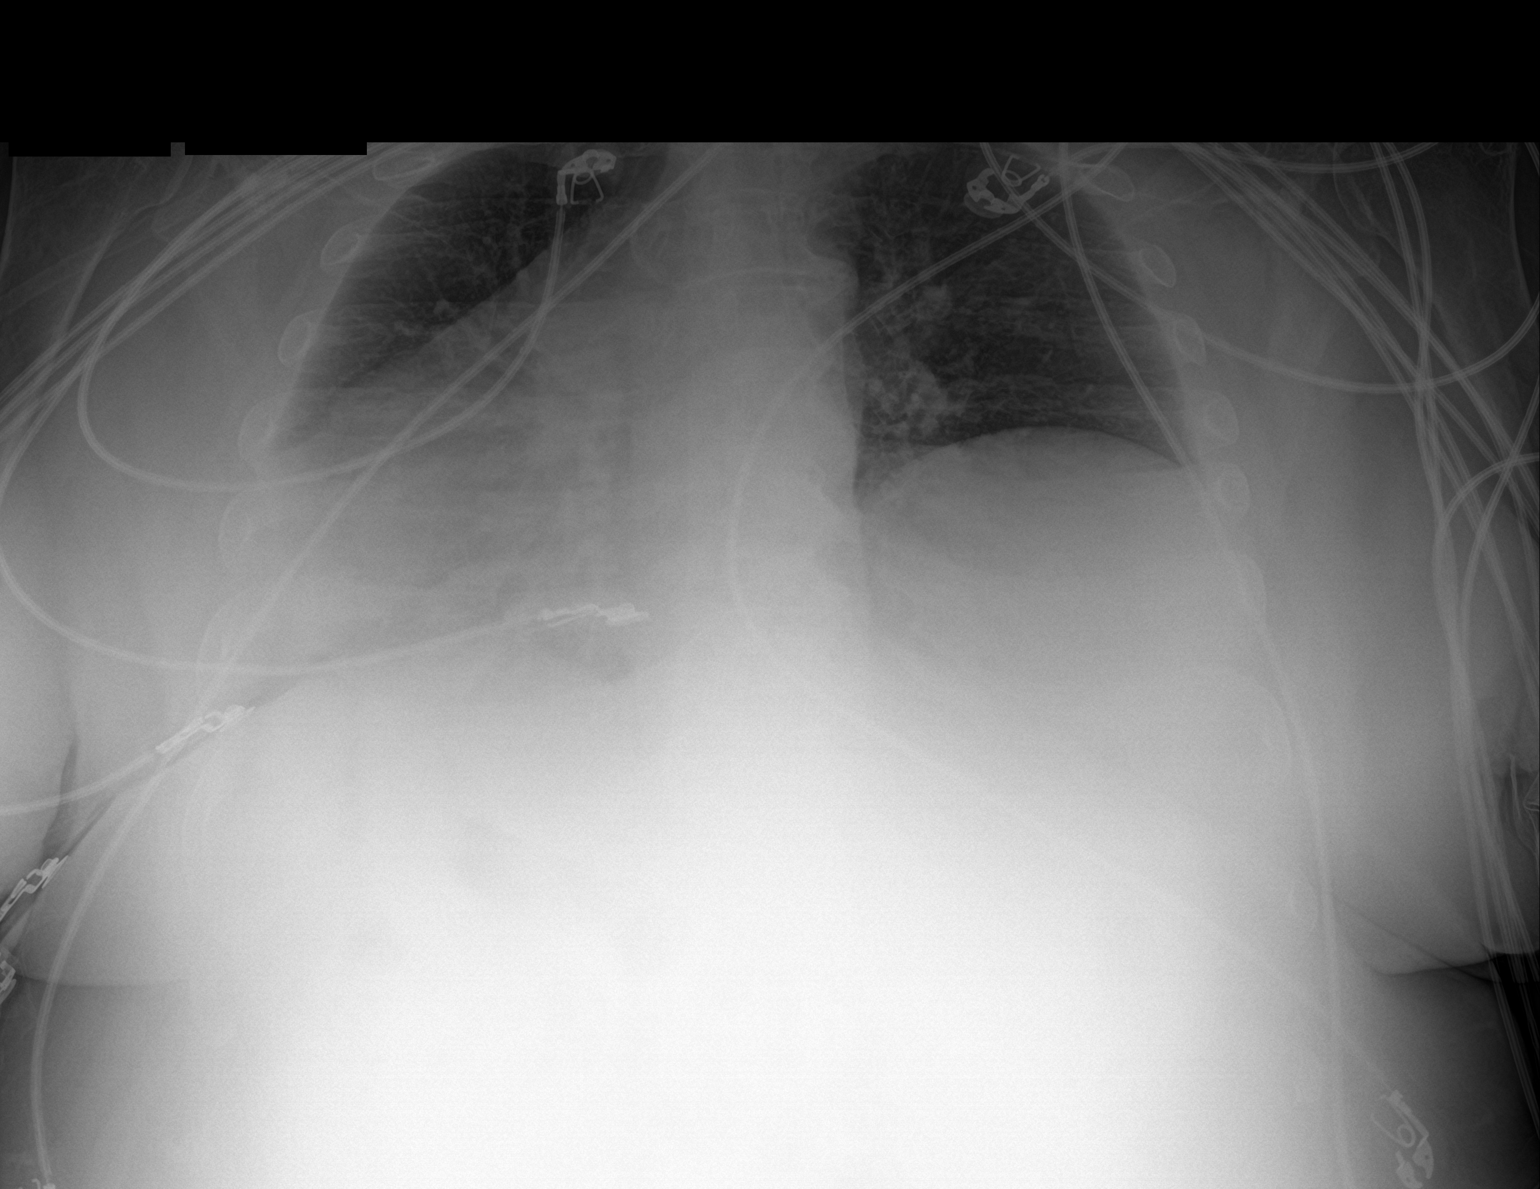

[1 of 1 positions shown; findings below may reference images not displayed]

FINDINGS: Shallow inspiration. Cardiac enlargement. No vascular congestion or
edema. Probable atelectasis in the left base. No pleural effusions.
No pneumothorax. Calcification of the aorta. Degenerative changes in
the spine.
IMPRESSION: Shallow inspiration with atelectasis in the left base. Cardiac
enlargement.

## 2021-03-29 IMAGING — CT CT CHEST-ABD-PELV W/O CM
2 of 5 series · 15 of 46 positions shown, 17 images · non-contrast
Comparison: None.

CLINICAL DATA: Sepsis, UTI



[Series 5: cap w/o 2.0 mm st · axial · non-contrast · 0.87mm/px · z∈[+713,+1283]mm · 12 of 323 slices shown, 14 images]
[im 19/323  soft-tissue]
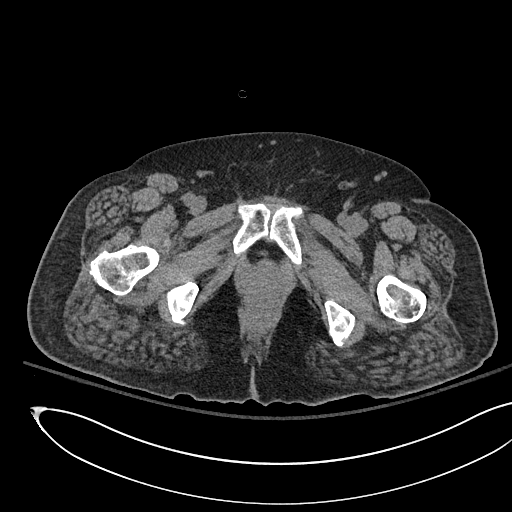
[im 19/323  bone]
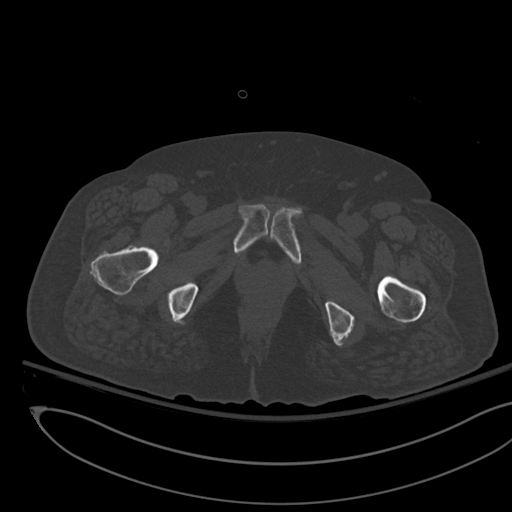
[im 57/323  soft-tissue]
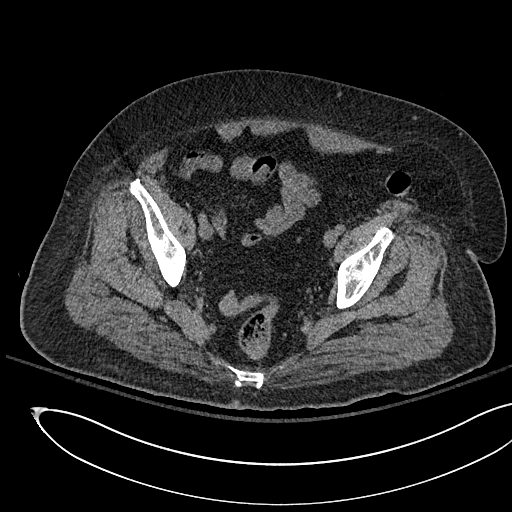
[im 76/323  soft-tissue]
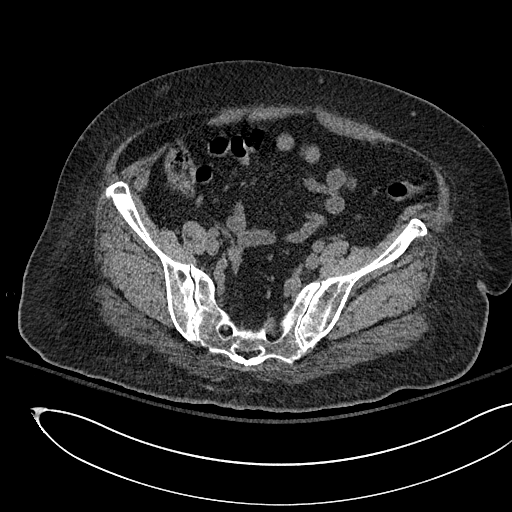
[im 95/323  soft-tissue]
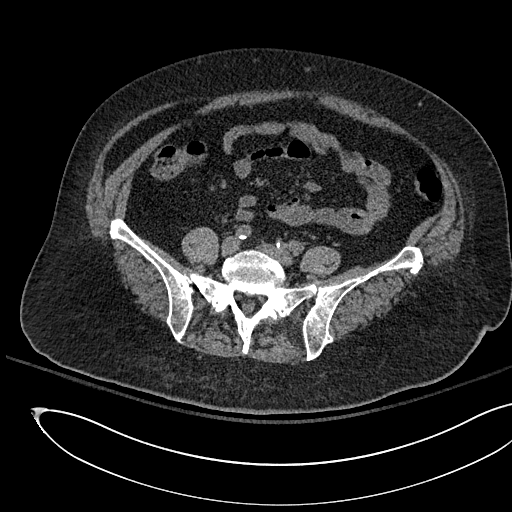
[im 133/323  soft-tissue]
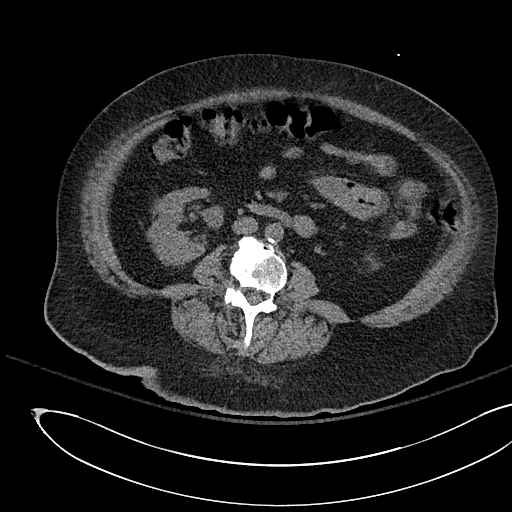
[im 152/323  soft-tissue]
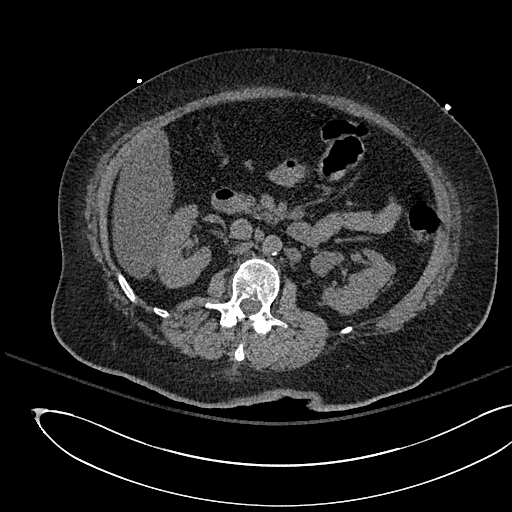
[im 171/323  soft-tissue]
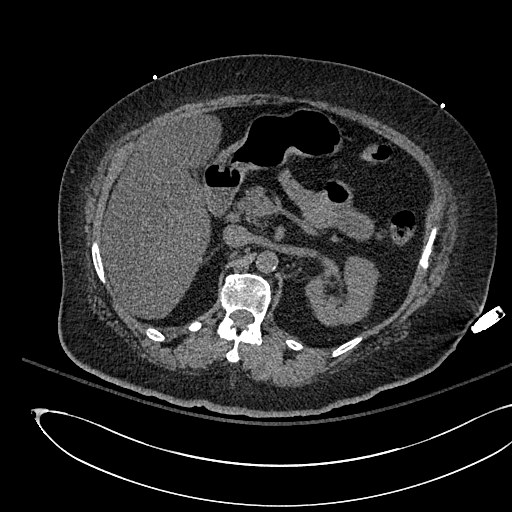
[im 209/323  soft-tissue]
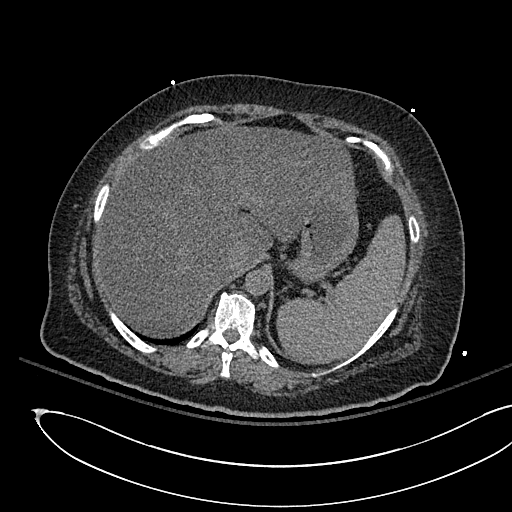
[im 228/323  soft-tissue]
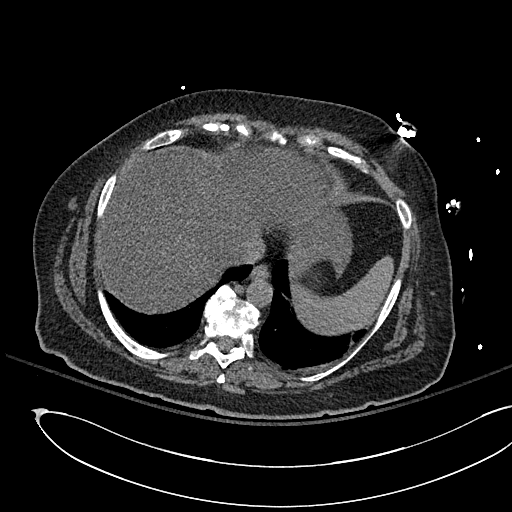
[im 228/323  bone]
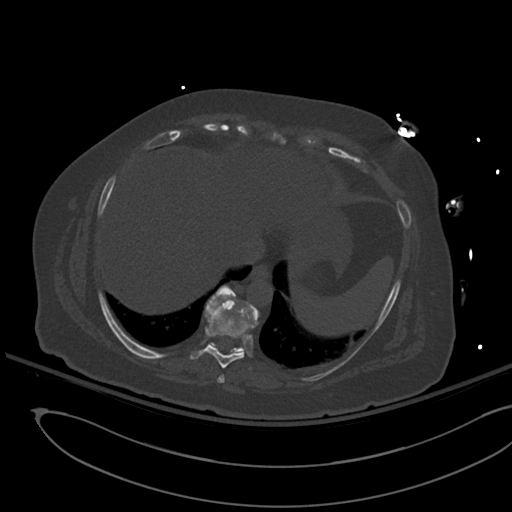
[im 247/323  soft-tissue]
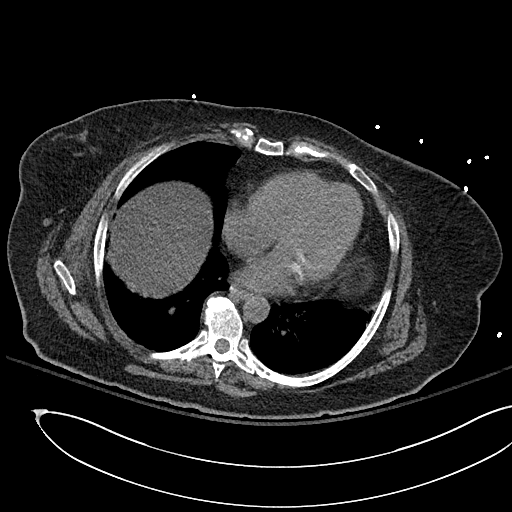
[im 285/323  soft-tissue]
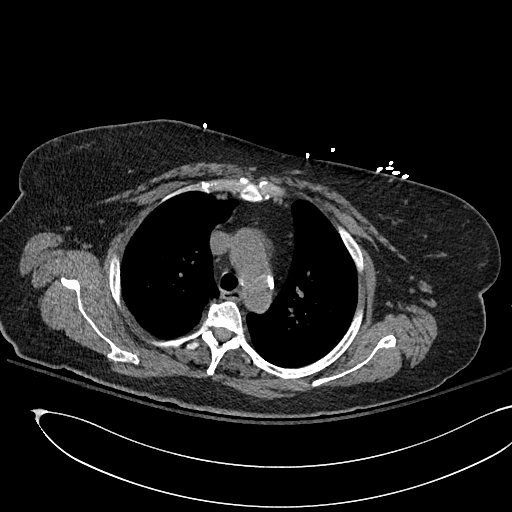
[im 304/323  soft-tissue]
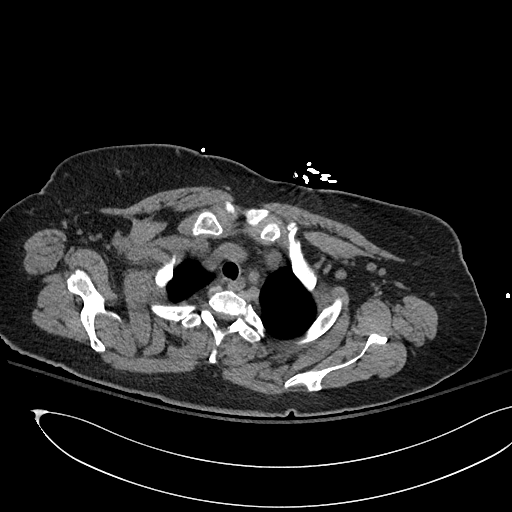

[Series 7: cap w/o 3.0 mm st cor · coronal · non-contrast · 0.76mm/px · 3 of 113 slices shown]
[im 38/113  soft-tissue]
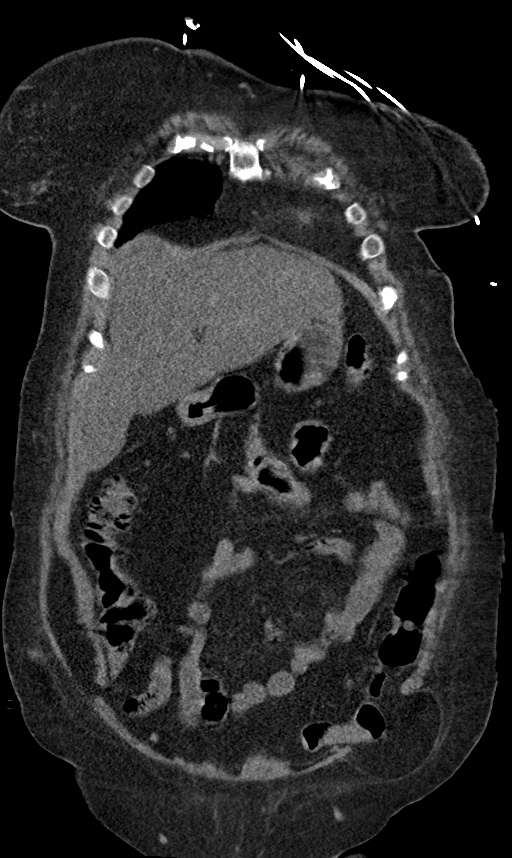
[im 50/113  soft-tissue]
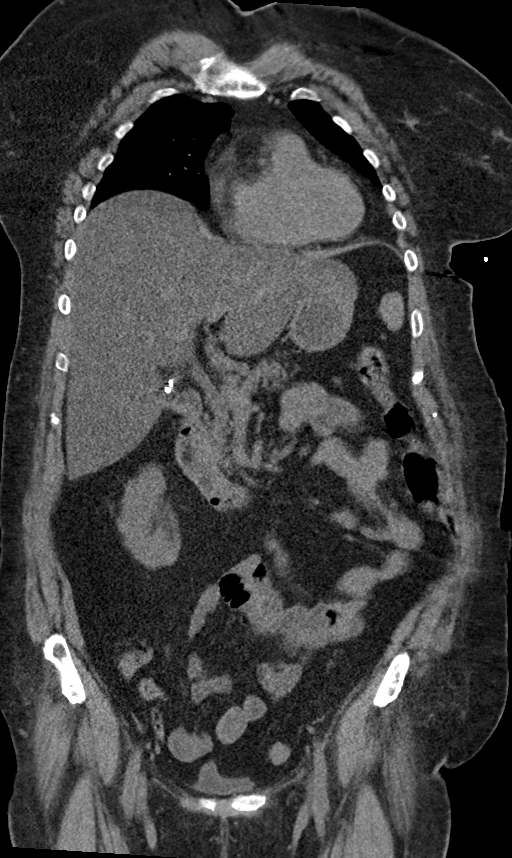
[im 63/113  soft-tissue]
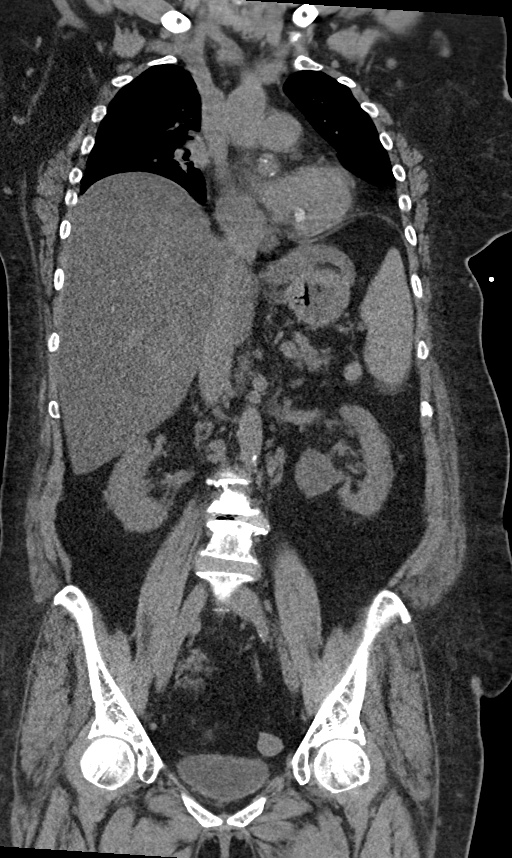

[15 of 46 positions shown; findings below may reference images not displayed]

FINDINGS: CT CHEST FINDINGS

Cardiovascular: Heart is normal size. Aorta is normal caliber.
Aortic calcifications.

Mediastinum/Nodes: No mediastinal, hilar, or axillary adenopathy.
Trachea and esophagus are unremarkable. Thyroid unremarkable.

Lungs/Pleura: Minimal bibasilar atelectasis. No confluent opacities
or effusions.

Musculoskeletal: Chest wall soft tissues are unremarkable. No acute
bony abnormality

CT ABDOMEN PELVIS FINDINGS

Hepatobiliary: Prior cholecystectomy. Diffuse fatty infiltration of
the liver. No focal hepatic abnormality.

Pancreas: No focal abnormality or ductal dilatation.

Spleen: No focal abnormality.  Normal size.

Adrenals/Urinary Tract: Punctate 1-2 mm nonobstructing stone in the
lower pole of the left kidney. No ureteral stones or hydronephrosis.
Adrenal glands and urinary bladder unremarkable.

Stomach/Bowel: Normal appendix. Stomach, large and small bowel
grossly unremarkable.

Vascular/Lymphatic: Aortic atherosclerosis. No evidence of aneurysm
or adenopathy.

Reproductive: Prior hysterectomy.  No adnexal masses.

Other: No free fluid or free air. Left lateral lower abdominal wall
hernia containing fat.

Musculoskeletal: No acute bony abnormality.
IMPRESSION: No acute cardiopulmonary disease.

Bibasilar atelectasis.

Aortic atherosclerosis.

Hepatic steatosis.

Punctate left lower pole nephrolithiasis.

## 2021-03-29 MED ORDER — GLIMEPIRIDE 2 MG PO TABS
2.0000 mg | ORAL_TABLET | Freq: Every day | ORAL | Status: DC
  Administered 2021-03-29: 2 mg via ORAL
  Filled 2021-03-29: qty 1

## 2021-03-29 MED ORDER — ATORVASTATIN CALCIUM 80 MG PO TABS
80.0000 mg | ORAL_TABLET | Freq: Every day | ORAL | Status: DC
Start: 2021-03-29 — End: 2021-03-31
  Administered 2021-03-29 – 2021-03-31 (×3): 80 mg via ORAL
  Filled 2021-03-29 (×2): qty 1
  Filled 2021-03-29: qty 2

## 2021-03-29 MED ORDER — METOPROLOL SUCCINATE ER 25 MG PO TB24
25.0000 mg | ORAL_TABLET | Freq: Every day | ORAL | Status: DC
Start: 2021-03-29 — End: 2021-03-31
  Administered 2021-03-30 – 2021-03-31 (×2): 25 mg via ORAL
  Filled 2021-03-29 (×3): qty 1

## 2021-03-29 MED ORDER — INSULIN ASPART 100 UNIT/ML IJ SOLN
0.0000 [IU] | Freq: Every day | INTRAMUSCULAR | Status: DC
  Administered 2021-03-30: 3 [IU] via SUBCUTANEOUS

## 2021-03-29 MED ORDER — FAMOTIDINE 20 MG PO TABS
40.0000 mg | ORAL_TABLET | Freq: Every day | ORAL | Status: DC
  Administered 2021-03-29 – 2021-03-30 (×2): 40 mg via ORAL
  Filled 2021-03-29 (×2): qty 2

## 2021-03-29 MED ORDER — AMITRIPTYLINE HCL 25 MG PO TABS
25.0000 mg | ORAL_TABLET | Freq: Every day | ORAL | Status: DC
  Administered 2021-03-29 – 2021-03-30 (×2): 25 mg via ORAL
  Filled 2021-03-29 (×2): qty 1

## 2021-03-29 MED ORDER — AMLODIPINE BESYLATE 10 MG PO TABS
10.0000 mg | ORAL_TABLET | Freq: Every day | ORAL | Status: DC
Start: 2021-03-29 — End: 2021-03-31
  Administered 2021-03-30 – 2021-03-31 (×2): 10 mg via ORAL
  Filled 2021-03-29 (×2): qty 1
  Filled 2021-03-29: qty 2

## 2021-03-29 MED ORDER — GABAPENTIN 300 MG PO CAPS
300.0000 mg | ORAL_CAPSULE | Freq: Two times a day (BID) | ORAL | Status: DC
  Administered 2021-03-29 – 2021-03-31 (×5): 300 mg via ORAL
  Filled 2021-03-29 (×6): qty 1

## 2021-03-29 MED ORDER — INSULIN ASPART 100 UNIT/ML IJ SOLN
0.0000 [IU] | Freq: Three times a day (TID) | INTRAMUSCULAR | Status: DC
  Administered 2021-03-29 (×2): 3 [IU] via SUBCUTANEOUS
  Administered 2021-03-30 (×2): 2 [IU] via SUBCUTANEOUS
  Administered 2021-03-30 – 2021-03-31 (×2): 5 [IU] via SUBCUTANEOUS

## 2021-03-29 MED ORDER — ACETAMINOPHEN 650 MG RE SUPP: 650.0000 mg | Freq: Four times a day (QID) | RECTAL | Status: DC | PRN

## 2021-03-29 MED ORDER — VANCOMYCIN HCL 750 MG/150ML IV SOLN
750.0000 mg | Freq: Two times a day (BID) | INTRAVENOUS | Status: DC
  Administered 2021-03-29 – 2021-03-30 (×2): 750 mg via INTRAVENOUS
  Filled 2021-03-29 (×2): qty 150

## 2021-03-29 MED ORDER — SODIUM CHLORIDE 0.9 % IV SOLN
2.0000 g | Freq: Two times a day (BID) | INTRAVENOUS | Status: DC
  Administered 2021-03-29 – 2021-03-30 (×3): 2 g via INTRAVENOUS
  Filled 2021-03-29 (×3): qty 20

## 2021-03-29 MED ORDER — ONDANSETRON HCL 4 MG PO TABS: 4.0000 mg | ORAL_TABLET | Freq: Four times a day (QID) | ORAL | Status: DC | PRN

## 2021-03-29 MED ORDER — SERTRALINE HCL 50 MG PO TABS
50.0000 mg | ORAL_TABLET | Freq: Every day | ORAL | Status: DC
Start: 2021-03-29 — End: 2021-03-31
  Administered 2021-03-29 – 2021-03-31 (×3): 50 mg via ORAL
  Filled 2021-03-29 (×3): qty 1

## 2021-03-29 MED ORDER — ACETAMINOPHEN 325 MG PO TABS
650.0000 mg | ORAL_TABLET | Freq: Four times a day (QID) | ORAL | Status: DC | PRN
  Administered 2021-03-29 (×2): 650 mg via ORAL
  Filled 2021-03-29 (×2): qty 2

## 2021-03-29 MED ORDER — DEXTROSE 5 % IV SOLN
750.0000 mg | Freq: Once | INTRAVENOUS | Status: AC
  Administered 2021-03-29: 750 mg via INTRAVENOUS
  Filled 2021-03-29: qty 15

## 2021-03-29 MED ORDER — DEXTROSE 5 % IV SOLN
750.0000 mg | Freq: Two times a day (BID) | INTRAVENOUS | Status: DC
  Administered 2021-03-29 – 2021-03-30 (×2): 750 mg via INTRAVENOUS
  Filled 2021-03-29 (×4): qty 15

## 2021-03-29 MED ORDER — BUSPIRONE HCL 5 MG PO TABS
5.0000 mg | ORAL_TABLET | Freq: Two times a day (BID) | ORAL | Status: DC
  Administered 2021-03-29 – 2021-03-31 (×5): 5 mg via ORAL
  Filled 2021-03-29 (×5): qty 1

## 2021-03-29 MED ORDER — SODIUM CHLORIDE 0.9 % IV SOLN: INTRAVENOUS | Status: AC

## 2021-03-29 MED ORDER — VANCOMYCIN HCL 2000 MG/400ML IV SOLN
2000.0000 mg | Freq: Once | INTRAVENOUS | Status: AC
Start: 2021-03-29 — End: 2021-03-29
  Administered 2021-03-29: 2000 mg via INTRAVENOUS
  Filled 2021-03-29: qty 400

## 2021-03-29 MED ORDER — ONDANSETRON HCL 4 MG/2ML IJ SOLN: 4.0000 mg | Freq: Four times a day (QID) | INTRAMUSCULAR | Status: DC | PRN

## 2021-03-29 NOTE — ED Notes (Signed)
Patient returned to room from CT. 

## 2021-03-29 NOTE — Assessment & Plan Note (Addendum)
-  resolved quickly -: V/Q scan negative

## 2021-03-29 NOTE — Assessment & Plan Note (Signed)
Patient sees neurology.  She is on Elavil at night.

## 2021-03-29 NOTE — Assessment & Plan Note (Signed)
Due to her stroke in December.  Strength is pretty good on the left side now.

## 2021-03-29 NOTE — Assessment & Plan Note (Signed)
Patient takes Ajovy monthly injections for chronic migraines.

## 2021-03-29 NOTE — H&P (Signed)
History and Physical    Lindsay Rice LJQ:[id number] DOB: 1950/09/27 DOA: 03/28/2021  DOS: the patient was seen and examined on 03/28/2021  PCP: Rich Fuchs, PA   Patient coming from: Home  I have personally briefly reviewed patient's old medical records in [place]  Chief complaint: Fever, headache, altered mental status. 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  Initial triage note is "Pt via [facility], was sitting w husband & sudden onset weakness, HA, fever, lethargy & AMS, emesis x1 approx 1600. Hx Cdiff, CVA December, residual L sided deficits. No new deficits. Stroke screen clear w EMS   200/80 -> 160/92 16G L fa CBG 278, hx dm HR 120 102.18F 90% on RA, 94% on 4L"  Patient states that she has had chills today.  She has not had any fevers that she knows of.  She states that she was shaking.  Sounds like she had rigors.  She denies any dysuria.  She has had no back pain.  She was seen by her primary care doctor back in March 20, 2021.  She was noted to have a UTI.  Apparently her prescriptions got sent to the wrong pharmacy.  She finally picked them up yesterday.  She took 1 dose of Macrobid however she has not had any dysuria symptoms at all.  She has not had any fevers at all yesterday.  She did take 1 dose of Diflucan which seemed to treat her yeast vaginitis.  Her E. coli was pansensitive.  Patient states that she does remember sitting at the kitchen table with her husband.  She does remember telling him that she felt weird.  The next thing she knows she was on the floor.  She remembers waking up to EMS.  She has no prior history of seizures.  In review of the chart, back in April 2017, patient admitted to the hospital for intractable headache.  She had an LP performed which demonstrated 21 white cells glucose of  60 and a protein of 30.  Her HSV 1 and 2 PCR were negative.  Her varicella-zoster was also negative.  She was seen in the office for follow-up and diagnosed by neurology with chronic meningitis.  She continues have chronic headaches and takes monthly injections for this.  Work-up in the ER:  Initial vitals temp 102.9, heart rate 127 blood pressure 158/75 satting 93% on 4 L.  Initial lactic acid of 3.1 which improved to 2.3 with IV volume resuscitation.  Chemistry was notable for a glucose of 235 and a creatinine 1.18  White count 12.5, hemoglobin 12.3, platelets 230  COVID negative, influenza negative  UA was negative except for small amount of hemoglobin.  Specifically no bacteria was seen.  No leukocytes were seen.  CT chest abdomen pelvis were negative for pneumonia.  There is no acute intra-abdominal process.  CT head demonstrated no acute intracranial abnormalities.  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   ED Course: Presented with sepsis.  Volume resuscitated.  Started initially on meropenem.  CT scan of the head, chest, abdomen pelvis negative for source of infection.  UA is negative.  Review of Systems:  Review of Systems  Constitutional:  Positive for chills and fever.       Rigors  HENT: Negative.  Negative for ear pain.   Respiratory:  Negative for cough, hemoptysis, sputum production, shortness of breath and wheezing.   Cardiovascular: Negative.  Negative for chest pain and palpitations.  Gastrointestinal:  Negative for abdominal pain, diarrhea, heartburn, nausea and vomiting.  Genitourinary:  Negative for dysuria, flank pain, frequency, hematuria and urgency.  Musculoskeletal:  Positive for back pain. Negative for neck pain.  Skin: Negative.   Neurological:  Positive for weakness and headaches.       Chronic headaches Reportedly with altered mental status  Endo/Heme/Allergies:  Negative.   Psychiatric/Behavioral: Negative.    All other systems reviewed and are negative.  Past Medical History:  Diagnosis Date   Diabetes mellitus without complication (HCC)    Hypertension    Migraine headache    Mitral valve prolapse    Obesity (BMI 30.0-34.9)    Sleep apnea     No past surgical history on file.   reports that she has never smoked. She has never used smokeless tobacco. She reports that she does not currently use alcohol. She reports that she does not use drugs.  Allergies  Allergen Reactions   Iodinated Contrast Media Hives, Shortness Of Breath, Rash and Other (See Comments)    Required an Epi-pen   Iodine Hives, Shortness Of Breath and Rash   Shellfish-Derived Products Hives and Shortness Of Breath   Cefaclor Rash   Cheese Other (See Comments)    Headaches/migraines from Swiss and Parmesan only    Covid-19 (Mrna) Vaccine Nausea And Vomiting and Other (See Comments)    Severe Weakness, also   Levofloxacin Hives and Rash   Penicillins Hives and Rash   Wound Dressing Adhesive Other (See Comments)    Paper Tape ok Other reaction(s): Other Paper Tape ok    No family history on file.  Prior to Admission medications   Medication Sig Start Date End Date Taking? Authorizing Provider  Accu-Chek Softclix Lancets lancets Use as instructed 03/10/21  Yes Lovorn, Jinny Blossom, MD  acetaminophen (TYLENOL) 500 MG tablet Take 1,000 mg by mouth every 6 (six) hours as needed for moderate pain or headache.   Yes [provider]  albuterol (VENTOLIN HFA) 108 (90 Base) MCG/ACT inhaler Inhale 2 puffs into the lungs every 6 (six) hours as needed for wheezing or shortness of breath.   Yes [provider]  amitriptyline (ELAVIL) 25 MG tablet Take 25 mg by mouth at bedtime.   Yes [provider]  amLODipine (NORVASC) 10 MG tablet Take 1 tablet (10 mg total) by mouth daily. 03/10/21  Yes Lovorn, Jinny Blossom, MD  atorvastatin (LIPITOR) 80 MG tablet Take 80 mg by  mouth daily.   Yes [provider]  blood glucose meter kit and supplies KIT Dispense based on patient and insurance preference. Use up to four times daily as directed. 02/21/21  Yes Love, Ivan Anchors, PA-C  busPIRone (BUSPAR) 5 MG tablet Take 5 mg by mouth 2 (two) times daily.   Yes [provider]  clopidogrel (PLAVIX) 75 MG tablet Take 1 tablet (75 mg total) by mouth daily. 02/21/21  Yes Love, Ivan Anchors, PA-C  diclofenac Sodium (VOLTAREN) 1 % GEL Apply 2 g topically 3 (three) times daily. To right shoulder 02/21/21  Yes Love, Ivan Anchors, PA-C  EPINEPHrine 0.3 mg/0.3 mL IJ SOAJ injection Inject 0.3 mg into the muscle once as needed for anaphylaxis. 11/21/20  Yes [provider]  famotidine (PEPCID) 40 MG tablet Take 1 tablet (40 mg total) by mouth at bedtime. 02/21/21  Yes Love, Ivan Anchors, PA-C  fluticasone-salmeterol (ADVAIR HFA) 115-21 MCG/ACT inhaler Inhale 2 puffs into the lungs 2 (two) times daily.   Yes [provider]  Fremanezumab-vfrm (AJOVY) 225 MG/1.5ML SOSY Inject 225 mg into the skin every 30 (thirty) days.   Yes [provider]  gabapentin (NEURONTIN) 300 MG capsule Take 300 mg by mouth in the morning and at bedtime.   Yes [provider]  glimepiride (AMARYL) 2 MG tablet Take 1 tablet (2 mg total) by mouth daily with breakfast. 02/21/21  Yes Love, Ivan Anchors, PA-C  glucose blood (COOL BLOOD GLUCOSE TEST STRIPS) test strip Use as instructed Patient taking differently: daily. Use as instructed 03/10/21  Yes Lovorn, Jinny Blossom, MD  ibuprofen (ADVIL) 200 MG tablet Take 400 mg by mouth every 6 (six) hours as needed for headache or moderate pain.   Yes [provider]  JANUVIA 50 MG tablet Take 50 mg by mouth daily. 03/20/21  Yes [provider]  levothyroxine (SYNTHROID) 75 MCG tablet Take 75 mcg by mouth daily before breakfast.   Yes [provider]  methocarbamol (ROBAXIN) 500 MG tablet Take 1 tablet (500 mg total) by mouth every 8  (eight) hours as needed for muscle spasms. 03/10/21  Yes Lovorn, Jinny Blossom, MD  metoprolol succinate (TOPROL-XL) 25 MG 24 hr tablet Take 25 mg by mouth daily. 03/27/21  Yes [provider]  nitrofurantoin, macrocrystal-monohydrate, (MACROBID) 100 MG capsule Take 100 mg by mouth See admin instructions. Bid x 5 days 03/25/21  Yes [provider]  ondansetron (ZOFRAN-ODT) 4 MG disintegrating tablet Take 4 mg by mouth every 8 (eight) hours as needed for nausea or vomiting (dissolve orally).   Yes [provider]  senna-docusate (SENOKOT-S) 8.6-50 MG tablet Take 1 tablet by mouth at bedtime as needed for moderate constipation or mild constipation. Patient taking differently: Take 1 tablet by mouth daily. 02/10/21  Yes de Yolanda Manges, Cortney E, NP  sertraline (ZOLOFT) 50 MG tablet Take 50 mg by mouth daily. 03/27/21  Yes [provider]  triamcinolone cream (KENALOG) 0.1 % Apply 1 application topically 2 (two) times daily as needed (rash).   Yes [provider]  aspirin 81 MG chewable tablet Chew 1 tablet (81 mg total) by mouth daily. Till 03/03/21 then stop taking it. Patient not taking: Reported on 03/10/2021 02/21/21   Love, Ivan Anchors, PA-C  empagliflozin (JARDIANCE) 25 MG TABS tablet Take 1 tablet (25 mg total) by mouth daily. Patient not taking: Reported on 03/28/2021 03/10/21   Courtney Heys, MD    Physical Exam: Vitals:   03/29/21 0045 03/29/21 0221 03/29/21 0230 03/29/21 0300  BP: (!) 119/56 (!) 117/56 (!) 120/57 110/63  Pulse: (!) 112 (!) 110 (!) 108 (!) 108  Resp: (!) 21 (!) 24 (!) 25 (!) 24  Temp:      TempSrc:      SpO2: 97% 98% 96% 97%  Weight:    91.3 kg  Height:    5' 5"  (1.651 m)    Physical Exam Vitals and nursing note reviewed.  Constitutional:      General: She is not in acute distress.    Appearance: Normal appearance. She is obese. She is not ill-appearing, toxic-appearing or diaphoretic.  HENT:     Head: Normocephalic and atraumatic.      Right Ear: Tympanic membrane normal.     Left Ear: Tympanic membrane normal.     Nose: Nose normal.     Mouth/Throat:  Pharynx: No oropharyngeal exudate or posterior oropharyngeal erythema.  Eyes:     General: No scleral icterus. Cardiovascular:     Rate and Rhythm: Regular rhythm. Tachycardia present.     Pulses: Normal pulses.  Pulmonary:     Effort: Pulmonary effort is normal. No respiratory distress.     Breath sounds: No wheezing or rales.  Abdominal:     General: Abdomen is protuberant. Bowel sounds are normal. There is no distension.     Palpations: Abdomen is soft.     Tenderness: There is no abdominal tenderness. There is no guarding or rebound.  Musculoskeletal:     Right lower leg: No edema.     Left lower leg: No edema.  Lymphadenopathy:     Cervical: Cervical adenopathy present.     Right cervical: Posterior cervical adenopathy present.     Comments: Small right posterior cervical adenopathy about 1 cm near right posterior hair line(posterior to auricle of right ear)  Skin:    General: Skin is warm and dry.     Capillary Refill: Capillary refill takes less than 2 seconds.     Findings: No lesion or rash.  Neurological:     General: No focal deficit present.     Mental Status: She is alert and oriented to person, place, and time.     Labs on Admission: I have personally reviewed following labs and imaging studies  CBC: Recent Labs  Lab 03/28/21 2038  WBC 12.5*  NEUTROABS 11.9*  HGB 12.3  HCT 37.4  MCV 84.8  PLT 628   Basic Metabolic Panel: Recent Labs  Lab 03/28/21 2038  NA 138  K 3.6  CL 103  CO2 23  GLUCOSE 235*  BUN 13  CREATININE 1.18*  CALCIUM 8.9   GFR: Estimated Creatinine Clearance: 49.5 mL/min (A) (by C-G formula based on SCr of 1.18 mg/dL (H)). Liver Function Tests: Recent Labs  Lab 03/28/21 2038  AST 48*  ALT 36  ALKPHOS 69  BILITOT 0.4  PROT 7.3  ALBUMIN 3.7   No results for input(s): LIPASE, AMYLASE in the last 168  hours. No results for input(s): AMMONIA in the last 168 hours. Coagulation Profile: Recent Labs  Lab 03/28/21 2038  INR 1.1   Cardiac Enzymes: No results for input(s): CKTOTAL, CKMB, CKMBINDEX, TROPONINI in the last 168 hours. BNP (last 3 results) No results for input(s): PROBNP in the last 8760 hours. HbA1C: No results for input(s): HGBA1C in the last 72 hours. CBG: No results for input(s): GLUCAP in the last 168 hours. Lipid Profile: No results for input(s): CHOL, HDL, LDLCALC, TRIG, CHOLHDL, LDLDIRECT in the last 72 hours. Thyroid Function Tests: No results for input(s): TSH, T4TOTAL, FREET4, T3FREE, THYROIDAB in the last 72 hours. Anemia Panel: No results for input(s): VITAMINB12, FOLATE, FERRITIN, TIBC, IRON, RETICCTPCT in the last 72 hours. Urine analysis:    Component Value Date/Time   COLORURINE YELLOW 03/29/2021 [place] 03/29/2021 0053   LABSPEC 1.014 03/29/2021 0053   PHURINE 5.0 03/29/2021 0053   GLUCOSEU NEGATIVE 03/29/2021 0053   HGBUR SMALL (A) 03/29/2021 0053   BILIRUBINUR NEGATIVE 03/29/2021 0053   KETONESUR NEGATIVE 03/29/2021 0053   PROTEINUR NEGATIVE 03/29/2021 0053   NITRITE NEGATIVE 03/29/2021 0053   LEUKOCYTESUR NEGATIVE 03/29/2021 0053    Radiological Exams on Admission: I have personally reviewed images DG Chest 1 View  Result Date: 03/29/2021 CLINICAL DATA:  Pneumonia, weakness, altered mental status EXAM: CHEST  1 VIEW COMPARISON:  03/28/2021 FINDINGS:  Shallow inspiration. Cardiac enlargement. No vascular congestion or edema. Probable atelectasis in the left base. No pleural effusions. No pneumothorax. Calcification of the aorta. Degenerative changes in the spine. IMPRESSION: Shallow inspiration with atelectasis in the left base. Cardiac enlargement. Electronically Signed   By: Lucienne Capers M.D.   On: 03/29/2021 01:15   CT HEAD WO CONTRAST (5MM)  Result Date: 03/29/2021 CLINICAL DATA:  Delirium EXAM: CT HEAD WITHOUT CONTRAST  TECHNIQUE: Contiguous axial images were obtained from the base of the skull through the vertex without intravenous contrast. RADIATION DOSE REDUCTION: This exam was performed according to the departmental dose-optimization program which includes automated exposure control, adjustment of the mA and/or kV according to patient size and/or use of iterative reconstruction technique. COMPARISON:  02/08/2021 FINDINGS: Brain: Old right internal capsule lacunar infarct, unchanged. No acute intracranial abnormality. Specifically, no hemorrhage, hydrocephalus, mass lesion, acute infarction, or significant intracranial injury. Vascular: No hyperdense vessel or unexpected calcification. Skull: No acute calvarial abnormality. Sinuses/Orbits: No acute findings Other: None IMPRESSION: No acute intracranial abnormality. Electronically Signed   By: Rolm Baptise M.D.   On: 03/29/2021 00:08   DG Chest Port 1 View  Result Date: 03/28/2021 CLINICAL DATA:  Sepsis workup. EXAM: PORTABLE CHEST 1 VIEW COMPARISON:  PA Lat 04/19/2017. FINDINGS: The cardiac size is normal. Stable mediastinal configuration with calcification in the aortic knob. The lungs are hypoexpanded. There is increased opacity in the lateral left base which could be due to atelectasis or a small pneumonia. The remaining hypoinflated lungs are clear with mild elevation right hemidiaphragm. No pleural effusion is seen. Thoracic spondylosis and degenerative disc disease. IMPRESSION: Limited exam due to low inspiration. Questionable increased opacity lateral left base, could be atelectasis or pneumonia. PA Lat study in full inspiration recommended. Aortic atherosclerosis. Electronically Signed   By: Telford Nab M.D.   On: 03/28/2021 21:35   CT CHEST ABDOMEN PELVIS WO CONTRAST  Result Date: 03/29/2021 CLINICAL DATA:  Sepsis, UTI EXAM: CT CHEST, ABDOMEN AND PELVIS WITHOUT CONTRAST TECHNIQUE: Multidetector CT imaging of the chest, abdomen and pelvis was performed  following the standard protocol without IV contrast. RADIATION DOSE REDUCTION: This exam was performed according to the departmental dose-optimization program which includes automated exposure control, adjustment of the mA and/or kV according to patient size and/or use of iterative reconstruction technique. COMPARISON:  None. FINDINGS: CT CHEST FINDINGS Cardiovascular: Heart is normal size. Aorta is normal caliber. Aortic calcifications. Mediastinum/Nodes: No mediastinal, hilar, or axillary adenopathy. Trachea and esophagus are unremarkable. Thyroid unremarkable. Lungs/Pleura: Minimal bibasilar atelectasis. No confluent opacities or effusions. Musculoskeletal: Chest wall soft tissues are unremarkable. No acute bony abnormality CT ABDOMEN PELVIS FINDINGS Hepatobiliary: Prior cholecystectomy. Diffuse fatty infiltration of the liver. No focal hepatic abnormality. Pancreas: No focal abnormality or ductal dilatation. Spleen: No focal abnormality.  Normal size. Adrenals/Urinary Tract: Punctate 1-2 mm nonobstructing stone in the lower pole of the left kidney. No ureteral stones or hydronephrosis. Adrenal glands and urinary bladder unremarkable. Stomach/Bowel: Normal appendix. Stomach, large and small bowel grossly unremarkable. Vascular/Lymphatic: Aortic atherosclerosis. No evidence of aneurysm or adenopathy. Reproductive: Prior hysterectomy.  No adnexal masses. Other: No free fluid or free air. Left lateral lower abdominal wall hernia containing fat. Musculoskeletal: No acute bony abnormality. IMPRESSION: No acute cardiopulmonary disease. Bibasilar atelectasis. Aortic atherosclerosis. Hepatic steatosis. Punctate left lower pole nephrolithiasis. Electronically Signed   By: Rolm Baptise M.D.   On: 03/29/2021 02:25    EKG: I have personally reviewed EKG: sinus tachycardia   Assessment/Plan Principal Problem:  Sepsis with acute organ dysfunction without septic shock (HCC) Active Problems:   Fever, unknown origin    Essential hypertension   Diabetes mellitus ([place])   Left hemiparesis (HCC)   Acquired hypothyroidism   Chronic headache   Chronic meningitis    Assessment and Plan: * Sepsis with acute organ dysfunction without septic shock ([place])- (present on admission) Admit to progressive care.  Patient did come in with sepsis with fever of 103, tachycardia and lactic acidosis.  Patient's work-up has been negative thus far except for lumbar puncture.  Unfortunately patient cannot have an LP due to concomitant Plavix use from her recent stroke in December.  Interestingly, patient does carry diagnosis of chronic meningitis which was added to her chart in April 2017.  Going back to the records, she was seen by neurology at that time.  She was admitted to the hospital.  She did have CSF pleocytosis with elevated protein but her cultures and her zoster PCR were all negative.  Patient does have chronic headaches and she takes monthly injections of Ajovy.  Given the fact that all her other sources of potential infection including chest CT, abdominal CT, head CT, UA are all negative, and we cannot rule out a primary CNS infection, I think patient needs to be started on primary treatment for presumed meningitis.  Start IV Rocephin, vancomycin, acyclovir.  Will need ID consult in the morning.  Patient did have a urine culture from 03/20/2021 that was done from the primary care office they did grow E. coli that was pansensitive.  However the patient has not had any dysuria symptoms at all.  There has been no increased urination frequency.  There is been no dysuria.  She did take 1 dose of Diflucan for some yeast vaginitis.  She states that her symptoms have now completely resolved.  Also check a echocardiogram to rule out subacute endocarditis.   We will hold her Plavix in case ID feels the patient needs to have a lumbar puncture done.  If decision is made not to perform LP, please restart Plavix as she had a stroke back in  December.  Fever, unknown origin See above for work-up.  Will need ID consult.  Chronic meningitis- (present on admission) Patient takes Ajovy monthly injections for chronic migraines.  Chronic headache- (present on admission) Patient sees neurology.  She is on Elavil at night.  Acquired hypothyroidism- (present on admission) Continue Synthroid.  Left hemiparesis ([place])- (present on admission) Due to her stroke in December.  Strength is pretty good on the left side now.  Diabetes mellitus (HCC) Continue Amaryl.  Placed on sliding scale insulin.  Essential hypertension- (present on admission) Continue her Norvasc.    DVT prophylaxis: SCDs. No chemical DVT prophylaxis in case she needs LP Code Status: Full Code Family Communication: no family at bedside  Disposition Plan: return home  Consults called: none  Admission status: Inpatient,  progressive   Kristopher Oppenheim, DO [facility] 03/29/2021, 3:48 AM

## 2021-03-29 NOTE — Progress Notes (Signed)
Patient admitted after midnight, please see H&P.  Here with fever/chills.  She was seen by her primary care doctor back in March 20, 2021.  She was noted to have a UTI.  Apparently her prescriptions got sent to the wrong pharmacy.  She finally picked them up 2/7.  She took 1 dose of Macrobid .   NP swab. No neck stiffness -feeling better this am but still with a nagging headache. Marlin Canary DO

## 2021-03-29 NOTE — ED Notes (Signed)
Patient transported to X-ray 

## 2021-03-29 NOTE — Progress Notes (Signed)
Pharmacy Antimicrobial Note  Lindsay Rice is a 71 y.o. female admitted on 03/28/2021 with  r/o CNS infection .  Pharmacy has been consulted for vancomycin and acyclovir dosing.  Plan: Vancomycin 2000mg  x1 then 750mg  IV Q12H.  Goal trough 15-20 mcg/mL. Acyclovir 750mg  IV Q12H.  Height: 5\' 5"  (165.1 cm) Weight: 91.3 kg (201 lb 4.5 oz) IBW/kg (Calculated) : 57  Temp (24hrs), Avg:101.2 F (38.4 C), Min:98.2 F (36.8 C), Max:102.9 F (39.4 C)  Recent Labs  Lab 03/28/21 2038 03/28/21 2333  WBC 12.5*  --   CREATININE 1.18*  --   LATICACIDVEN 3.1* 2.3*    Estimated Creatinine Clearance: 49.5 mL/min (A) (by C-G formula based on SCr of 1.18 mg/dL (H)).    Allergies  Allergen Reactions   Iodinated Contrast Media Hives, Shortness Of Breath, Rash and Other (See Comments)    Required an Epi-pen   Iodine Hives, Shortness Of Breath and Rash   Shellfish-Derived Products Hives and Shortness Of Breath   Cefaclor Rash   Cheese Other (See Comments)    Headaches/migraines from Swiss and Parmesan only    Covid-19 (Mrna) Vaccine Nausea And Vomiting and Other (See Comments)    Severe Weakness, also   Levofloxacin Hives and Rash   Penicillins Hives and Rash   Wound Dressing Adhesive Other (See Comments)    Paper Tape ok Other reaction(s): Other Paper Tape ok     Thank you for allowing pharmacy to be a part of this patients care.  , PharmD, BCPS  03/29/2021 4:27 AM

## 2021-03-29 NOTE — ED Notes (Signed)
Notified Dr. Clayborne Dana of pt's lactic of 2.3.

## 2021-03-29 NOTE — Progress Notes (Signed)
Echocardiogram 2D Echocardiogram has been performed.  Lindsay Rice 03/29/2021, 12:18 PM

## 2021-03-29 NOTE — Subjective & Objective (Signed)
Chief complaint: Fever, headache, altered mental status. 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  Initial triage note is "Pt via [facility], was sitting w husband & sudden onset weakness, HA, fever, lethargy & AMS, emesis x1 approx 1600. Hx Cdiff, CVA December, residual L sided deficits. No new deficits. Stroke screen clear w EMS   200/80 -> 160/92 16G L fa CBG 278, hx dm HR 120 102.26F 90% on RA, 94% on 4L"  Patient states that she has had chills today.  She has not had any fevers that she knows of.  She states that she was shaking.  Sounds like she had rigors.  She denies any dysuria.  She has had no back pain.  She was seen by her primary care doctor back in March 20, 2021.  She was noted to have a UTI.  Apparently her prescriptions got sent to the wrong pharmacy.  She finally picked them up yesterday.  She took 1 dose of Macrobid however she has not had any dysuria symptoms at all.  She has not had any fevers at all yesterday.  She did take 1 dose of Diflucan which seemed to treat her yeast vaginitis.  Her E. coli was pansensitive.  Patient states that she does remember sitting at the kitchen table with her husband.  She does remember telling him that she felt weird.  The next thing she knows she was on the floor.  She remembers waking up to EMS.  She has no prior history of seizures.  In review of the chart, back in April 2017, patient admitted to the hospital for intractable headache.  She had an LP performed which demonstrated 21 white cells glucose of 60 and a protein of 30.  Her HSV 1 and 2 PCR were negative.  Her varicella-zoster was also negative.  She was seen in the office for follow-up and diagnosed by neurology with chronic meningitis.  She continues have chronic headaches and takes monthly injections for this.  Work-up in  the ER:  Initial vitals temp 102.9, heart rate 127 blood pressure 158/75 satting 93% on 4 L.  Initial lactic acid of 3.1 which improved to 2.3 with IV volume resuscitation.  Chemistry was notable for a glucose of 235 and a creatinine 1.18  White count 12.5, hemoglobin 12.3, platelets 230  COVID negative, influenza negative  UA was negative except for small amount of hemoglobin.  Specifically no bacteria was seen.  No leukocytes were seen.  CT chest abdomen pelvis were negative for pneumonia.  There is no acute intra-abdominal process.  CT head demonstrated no acute intracranial abnormalities.  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

## 2021-03-29 NOTE — Assessment & Plan Note (Signed)
Continue Synthroid °

## 2021-03-29 NOTE — Assessment & Plan Note (Addendum)
-  resolved quickly -no true source found so NOT sepsis -ID consult -d/c abx -resume plavix

## 2021-03-29 NOTE — ED Notes (Signed)
Patient transported to CT 

## 2021-03-29 NOTE — Assessment & Plan Note (Signed)
Continue her Norvasc.

## 2021-03-29 NOTE — Assessment & Plan Note (Addendum)
-  Placed on sliding scale insulin.

## 2021-03-30 DIAGNOSIS — R6520 Severe sepsis without septic shock: Secondary | ICD-10-CM | POA: Diagnosis not present

## 2021-03-30 DIAGNOSIS — E1149 Type 2 diabetes mellitus with other diabetic neurological complication: Secondary | ICD-10-CM | POA: Diagnosis not present

## 2021-03-30 DIAGNOSIS — A419 Sepsis, unspecified organism: Secondary | ICD-10-CM | POA: Diagnosis not present

## 2021-03-30 DIAGNOSIS — R509 Fever, unspecified: Secondary | ICD-10-CM | POA: Diagnosis not present

## 2021-03-30 DIAGNOSIS — E039 Hypothyroidism, unspecified: Secondary | ICD-10-CM | POA: Diagnosis not present

## 2021-03-30 DIAGNOSIS — J189 Pneumonia, unspecified organism: Secondary | ICD-10-CM | POA: Diagnosis not present

## 2021-03-30 LAB — GLUCOSE, CAPILLARY
Glucose-Capillary: 212 mg/dL — ABNORMAL HIGH (ref 70–99)
Glucose-Capillary: 126 mg/dL — ABNORMAL HIGH (ref 70–99)
Glucose-Capillary: 141 mg/dL — ABNORMAL HIGH (ref 70–99)
Glucose-Capillary: 251 mg/dL — ABNORMAL HIGH (ref 70–99)

## 2021-03-30 LAB — BASIC METABOLIC PANEL
Anion gap: 9 (ref 5–15)
BUN: 12 mg/dL (ref 8–23)
CO2: 22 mmol/L (ref 22–32)
Calcium: 8.1 mg/dL — ABNORMAL LOW (ref 8.9–10.3)
Chloride: 109 mmol/L (ref 98–111)
Creatinine, Ser: 0.91 mg/dL (ref 0.44–1.00)
GFR, Estimated: >60 mL/min (ref >60)
Glucose, Bld: 112 mg/dL — ABNORMAL HIGH (ref 70–99)
Potassium: 3.7 mmol/L (ref 3.5–5.1)
Sodium: 140 mmol/L (ref 135–145)

## 2021-03-30 LAB — CBC
HCT: 31.2 % — ABNORMAL LOW (ref 36.0–46.0)
Hemoglobin: 10.4 g/dL — ABNORMAL LOW (ref 12.0–15.0)
MCH: 28 pg (ref 26.0–34.0)
MCHC: 33.3 g/dL (ref 30.0–36.0)
MCV: 83.9 fL (ref 80.0–100.0)
Platelets: 178 10*3/uL (ref 150–400)
RBC: 3.72 MIL/uL — ABNORMAL LOW (ref 3.87–5.11)
RDW: 13.7 % (ref 11.5–15.5)
WBC: 5.5 10*3/uL (ref 4.0–10.5)
nRBC: 0 % (ref 0.0–0.2)

## 2021-03-30 LAB — URINE CULTURE: Culture: NO GROWTH

## 2021-03-30 LAB — PROCALCITONIN: Procalcitonin: 17.82 ng/mL

## 2021-03-30 MED ORDER — RISAQUAD PO CAPS
1.0000 | ORAL_CAPSULE | Freq: Every day | ORAL | Status: DC
  Administered 2021-03-30 – 2021-03-31 (×2): 1 via ORAL
  Filled 2021-03-30 (×2): qty 1

## 2021-03-30 MED ORDER — DEXTROSE 5 % IV SOLN
750.0000 mg | Freq: Three times a day (TID) | INTRAVENOUS | Status: DC
  Filled 2021-03-30 (×2): qty 15

## 2021-03-30 MED ORDER — CLOPIDOGREL BISULFATE 75 MG PO TABS
75.0000 mg | ORAL_TABLET | Freq: Every day | ORAL | Status: DC
  Administered 2021-03-30 – 2021-03-31 (×2): 75 mg via ORAL
  Filled 2021-03-30 (×2): qty 1

## 2021-03-30 MED ORDER — LEVOTHYROXINE SODIUM 75 MCG PO TABS
75.0000 ug | ORAL_TABLET | Freq: Every day | ORAL | Status: DC
  Administered 2021-03-30 – 2021-03-31 (×2): 75 ug via ORAL
  Filled 2021-03-30 (×2): qty 1

## 2021-03-30 MED ORDER — LACTATED RINGERS IV SOLN: INTRAVENOUS | Status: DC

## 2021-03-30 MED ORDER — MOMETASONE FURO-FORMOTEROL FUM 200-5 MCG/ACT IN AERO
2.0000 | INHALATION_SPRAY | Freq: Two times a day (BID) | RESPIRATORY_TRACT | Status: DC
  Administered 2021-03-30 – 2021-03-31 (×3): 2 via RESPIRATORY_TRACT
  Filled 2021-03-30: qty 8.8

## 2021-03-30 NOTE — Progress Notes (Signed)
°  Progress Note   Patient: Lindsay Rice QQV:[id number] DOB: 02-26-1950 DOA: 03/28/2021     1 DOS: the patient was seen and examined on 03/30/2021   Brief hospital course: Lindsay Rice is a 71 y.o. female with a history of aseptic meningitis from unclear etiology and treated with a prednisone taper who presented with altered mentation and fever.  She was at home and does not remember much but was acutely rigorous and altered and brought in by EMS.  Her initial blood pressure was 200/80, tachycardic and some hypoxia and fever to 102.4.  she was started on empiric antibiotics and admitted.  She does not remember the events until she was in the hospital room.  She feels she is back to her baseline now.  She has had no headache.  No neck pain.    Assessment and Plan: * Sepsis with acute organ dysfunction without septic shock (HCC)- (present on admission) -resolved quickly -no true source found so NOT sepsis -ID consult -d/c abx -resume plavix  Chronic headache- (present on admission) Patient sees neurology.  She is on Elavil at night.  Acquired hypothyroidism- (present on admission) Continue Synthroid.  Fever, unknown origin -resolved quickly  Left hemiparesis (HCC)- (present on admission) Due to her stroke in December.  Strength is pretty good on the left side now.  Diabetes mellitus (HCC) -Placed on sliding scale insulin.  Essential hypertension- (present on admission) Continue her Norvasc.       Subjective: no SOB, no HA- feels well  Physical Exam: Vitals:   03/29/21 1807 03/29/21 2146 03/30/21 0531 03/30/21 0836  BP: 121/61 (!) 114/45 140/70 129/71  Pulse: 97 92 92 (!) 107  Resp: 17 17 17 18   Temp: 98.7 F (37.1 C) 98.1 F (36.7 C) (!) 97.3 F (36.3 C) 98.1 F (36.7 C)  TempSrc: Oral Oral  Oral  SpO2: 95% 97% 95% 96%  Weight:      Height:        General: Appearance:    Obese female in no acute distress     Lungs:     respirations unlabored  Heart:     Tachycardic.   MS:   All extremities are intact.    Neurologic:   Awake, alert, oriented x 3     Data Reviewed: Labs unremarkable    Disposition: Status is: Inpatient Remains inpatient appropriate because: home in AM          Planned Discharge Destination: Home     Time spent: 45 minutes  Author: , DO 03/30/2021 12:45 PM  For on call review [email or web address].

## 2021-03-30 NOTE — Progress Notes (Signed)
Pharmacy Antimicrobial Note  Lindsay Rice is a 71 y.o. female admitted on 03/28/2021 with  r/o CNS infection .  Pharmacy has been consulted for vancomycin and acyclovir dosing.  She is also on Rocephin.  Patient was started on Macrobid for a UTI prior to admission; urine culture negative here.  Renal function improving, now afebrile, WBC normalized.  Plan: Continue vanc 750mg  IV Q12H for goal trough 15-20 mcg/mL Increase Acyclovir 750mg  IV Q8H using AdjBW - IVF discontinued  Rocephin 2gm IV Q12H per MD Monitor renal fxn, clinical progress, vanc trough this PM F/u ID consult  Height: 5\' 5"  (165.1 cm) Weight: 91.3 kg (201 lb 4.5 oz) IBW/kg (Calculated) : 57  Temp (24hrs), Avg:98.2 F (36.8 C), Min:97.3 F (36.3 C), Max:98.7 F (37.1 C)  Recent Labs  Lab 03/28/21 2038 03/28/21 2333 03/29/21 1403 03/30/21 0141  WBC 12.5*  --   --  5.5  CREATININE 1.18*  --   --  0.91  LATICACIDVEN 3.1* 2.3* 2.1*  --      Estimated Creatinine Clearance: 64.2 mL/min (by C-G formula based on SCr of 0.91 mg/dL).    Allergies  Allergen Reactions   Iodinated Contrast Media Hives, Shortness Of Breath, Rash and Other (See Comments)    Required an Epi-pen   Iodine Hives, Shortness Of Breath and Rash   Shellfish-Derived Products Hives and Shortness Of Breath   Cefaclor Rash   Cheese Other (See Comments)    Headaches/migraines from Swiss and Parmesan only    Covid-19 (Mrna) Vaccine Nausea And Vomiting and Other (See Comments)    Severe Weakness, also   Levofloxacin Hives and Rash   Penicillins Hives and Rash   Wound Dressing Adhesive Other (See Comments)    Paper Tape ok Other reaction(s): Other Paper Tape ok    Vanc 2/8>> Rocephin 2/8>> Acyclovir 2/8>> Macrobid x1 dose PTA Merrem x1 2/7   2/7 BCx - NGTD 2/8 UCx - negative 2/8 resp panel PCR - negative  Lindsay Rice D. 4/7, PharmD, BCPS, BCCCP 03/30/2021, 8:36 AM

## 2021-03-30 NOTE — Hospital Course (Signed)
Lindsay Rice is a 71 y.o. female with a history of aseptic meningitis from unclear etiology and treated with a prednisone taper who presented with altered mentation and fever.  She was at home and does not remember much but was acutely rigorous and altered and brought in by EMS.  Her initial blood pressure was 200/80, tachycardic and some hypoxia and fever to 102.4.  she was started on empiric antibiotics and admitted.  She does not remember the events until she was in the hospital room.  She feels she is back to her baseline now.  She has had no headache.  No neck pain.

## 2021-03-30 NOTE — Progress Notes (Signed)
Notified the nurse to remove the 2 IV's on the L arm AC infiltrated and FA EMS started. No other infusions or tests ordered at this time. Nurse declined needs for another PIV at this time. Instructed to consult IV team with any further needs. Tomasita Morrow, RN VAST

## 2021-03-30 NOTE — Consult Note (Signed)
[facility]       Reason for Consult: fever    Referring Physician: Dr. Benjamine Mola  Principal Problem:   Sepsis with acute organ dysfunction without septic shock [facility]) Active Problems:   Essential hypertension   Diabetes mellitus (HCC)   Left hemiparesis (HCC)   Fever, unknown origin   Acquired hypothyroidism   Chronic headache   Fever    acidophilus  1 capsule Oral Daily   amitriptyline  25 mg Oral QHS   amLODipine  10 mg Oral Daily   atorvastatin  80 mg Oral Daily   busPIRone  5 mg Oral BID   famotidine  40 mg Oral QHS   gabapentin  300 mg Oral BID   insulin aspart  0-15 Units Subcutaneous TID WC   insulin aspart  0-5 Units Subcutaneous QHS   levothyroxine  75 mcg Oral QAC breakfast   metoprolol succinate  25 mg Oral Daily   mometasone-formoterol  2 puff Inhalation BID   sertraline  50 mg Oral Daily    Recommendations:  Stop antibiotics Monitor for fever  Assessment: She had an acute episode of fever, tachycardia, hypertension of unknown etiology.  Her only recent medication change was the addition of metoprolol.  No sick contacts.   Mild leukocytosis and now fever and leukocytosis have quickly resolved.  No clinical signs of meningitis, no headache so also not c/w encephalitis.  She is breathing comfortably, no cough.  No signs of pneumonia. She is having no dysuria, no pyuria.    Antibiotics: Vancomycin, meropenem, ceftriaxone, acyclovir  HPI: Lindsay Rice is a 71 y.o. female with a history of aseptic meningitis from unclear etiology and treated with a prednisone taper who presented with altered mentation and fever.  She was at home and does not remember much but was acutely rigorous and altered and brought in by EMS.  Her initial blood pressure was 200/80, tachycardic and some hypoxia and fever to 102.4.  she was started on empiric antibiotics and admitted.  She does not remember the events until she was in the hospital room.  She feels  she is back to her baseline now.  She has had no headache.  No neck pain.     Review of Systems:  Constitutional: negative for fevers, chills, malaise, and anorexia Gastrointestinal: negative for nausea and diarrhea Integument/breast: negative for rash Neurological: negative for headaches, dizziness, and vertigo All other systems reviewed and are negative    Past Medical History:  Diagnosis Date   Diabetes mellitus without complication (HCC)    Hypertension    Migraine headache    Mitral valve prolapse    Obesity (BMI 30.0-34.9)    Sleep apnea     Social History   Tobacco Use   Smoking status: Never   Smokeless tobacco: Never  Vaping Use   Vaping Use: Never used  Substance Use Topics   Alcohol use: Not Currently   Drug use: Never    History reviewed. No pertinent family history.  Allergies  Allergen Reactions   Iodinated Contrast Media Hives, Shortness Of Breath, Rash and Other (See Comments)    Required an Epi-pen   Iodine Hives, Shortness Of Breath and Rash   Shellfish-Derived Products Hives and Shortness Of Breath   Cefaclor Rash   Cheese Other (See Comments)    Headaches/migraines from Swiss and Parmesan only    Covid-19 (Mrna) Vaccine Nausea And Vomiting and Other (See Comments)    Severe Weakness, also  Levofloxacin Hives and Rash   Penicillins Hives and Rash   Wound Dressing Adhesive Other (See Comments)    Paper Tape ok Other reaction(s): Other Paper Tape ok    Physical Exam: Constitutional: in no apparent distress  Vitals:   03/30/21 0531 03/30/21 0836  BP: 140/70 129/71  Pulse: 92 (!) 107  Resp: 17 18  Temp: (!) 97.3 F (36.3 C) 98.1 F (36.7 C)  SpO2: 95% 96%   EYES: anicteric Cardiovascular: Cor RRR Respiratory: clear; GI: soft Musculoskeletal: no pedal edema noted Skin: no rashes Neuro: non-focal  Lab Results  Component Value Date   WBC 5.5 03/30/2021   HGB 10.4 (L) 03/30/2021   HCT 31.2 (L) 03/30/2021   MCV 83.9 03/30/2021    PLT 178 03/30/2021    Lab Results  Component Value Date   CREATININE 0.91 03/30/2021   BUN 12 03/30/2021   NA 140 03/30/2021   K 3.7 03/30/2021   CL 109 03/30/2021   CO2 22 03/30/2021    Lab Results  Component Value Date   ALT 36 03/28/2021   AST 48 (H) 03/28/2021   ALKPHOS 69 03/28/2021     Microbiology: Recent Results (from the past 240 hour(s))  Blood Culture (routine x 2)     Status: None (Preliminary result)   Collection Time: 03/28/21  8:38 PM   Specimen: BLOOD  Result Value Ref Range Status   Specimen Description BLOOD LEFT ANTECUBITAL  Final   Special Requests   Final    BOTTLES DRAWN AEROBIC AND ANAEROBIC Blood Culture adequate volume   Culture   Final    NO GROWTH < 12 HOURS Performed at [facility], [address] [place], [place] [address]    Report Status PENDING  Incomplete  Blood Culture (routine x 2)     Status: None (Preliminary result)   Collection Time: 03/28/21  9:02 PM   Specimen: BLOOD  Result Value Ref Range Status   Specimen Description BLOOD SITE NOT SPECIFIED  Final   Special Requests   Final    BOTTLES DRAWN AEROBIC AND ANAEROBIC Blood Culture adequate volume   Culture   Final    NO GROWTH < 12 HOURS Performed at [facility], [address] [place], [place] [address]    Report Status PENDING  Incomplete  Resp Panel by RT-PCR (Flu A&B, Covid) Nasopharyngeal Swab     Status: None   Collection Time: 03/28/21 10:36 PM   Specimen: Nasopharyngeal Swab; Nasopharyngeal(NP) swabs in vial transport medium  Result Value Ref Range Status   SARS Coronavirus 2 by RT PCR NEGATIVE NEGATIVE Final    Comment: (NOTE) SARS-CoV-2 target nucleic acids are NOT DETECTED.  The SARS-CoV-2 RNA is generally detectable in upper respiratory specimens during the acute phase of infection. The lowest concentration of SARS-CoV-2 viral copies this assay can detect is 138 copies/mL. A negative result does not preclude SARS-Cov-2 infection and should  not be used as the sole basis for treatment or other patient management decisions. A negative result may occur with  improper specimen collection/handling, submission of specimen other than nasopharyngeal swab, presence of viral mutation(s) within the areas targeted by this assay, and inadequate number of viral copies(<138 copies/mL). A negative result must be combined with clinical observations, patient history, and epidemiological information. The expected result is Negative.  Fact Sheet for Patients:  BloggerCourse.com  Fact Sheet for Healthcare Providers:  SeriousBroker.it  This test is no t yet approved or cleared by the [place]  States FDA and  has been authorized for detection and/or diagnosis of SARS-CoV-2 by FDA under an Emergency Use Authorization (EUA). This EUA will remain  in effect (meaning this test can be used) for the duration of the COVID-19 declaration under Section 564(b)(1) of the Act, 21 U.S.C.section 360bbb-3(b)(1), unless the authorization is terminated  or revoked sooner.       Influenza A by PCR NEGATIVE NEGATIVE Final   Influenza B by PCR NEGATIVE NEGATIVE Final    Comment: (NOTE) The Xpert Xpress SARS-CoV-2/FLU/RSV plus assay is intended as an aid in the diagnosis of influenza from Nasopharyngeal swab specimens and should not be used as a sole basis for treatment. Nasal washings and aspirates are unacceptable for Xpert Xpress SARS-CoV-2/FLU/RSV testing.  Fact Sheet for Patients: BloggerCourse.com  Fact Sheet for Healthcare Providers: SeriousBroker.it  This test is not yet approved or cleared by the [place] FDA and has been authorized for detection and/or diagnosis of SARS-CoV-2 by FDA under an Emergency Use Authorization (EUA). This EUA will remain in effect (meaning this test can be used) for the duration of the COVID-19 declaration under  Section 564(b)(1) of the Act, 21 U.S.C. section 360bbb-3(b)(1), unless the authorization is terminated or revoked.  Performed at [facility], [address], [place], [place] [address]   Urine Culture     Status: None   Collection Time: 03/29/21 12:35 AM   Specimen: In/Out Cath Urine  Result Value Ref Range Status   Specimen Description IN/OUT CATH URINE  Final   Special Requests NONE  Final   Culture   Final    NO GROWTH Performed at [facility], [address], [place], [place] [address]    Report Status 03/30/2021 FINAL  Final  Respiratory (~20 pathogens) panel by PCR     Status: None   Collection Time: 03/29/21  1:52 PM   Specimen: Nasopharyngeal Swab; Respiratory  Result Value Ref Range Status   Adenovirus NOT DETECTED NOT DETECTED Final   Coronavirus 229E NOT DETECTED NOT DETECTED Final    Comment: (NOTE) The Coronavirus on the Respiratory Panel, DOES NOT test for the novel  Coronavirus (2019 nCoV)    Coronavirus HKU1 NOT DETECTED NOT DETECTED Final   Coronavirus NL63 NOT DETECTED NOT DETECTED Final   Coronavirus OC43 NOT DETECTED NOT DETECTED Final   Metapneumovirus NOT DETECTED NOT DETECTED Final   Rhinovirus / Enterovirus NOT DETECTED NOT DETECTED Final   Influenza A NOT DETECTED NOT DETECTED Final   Influenza B NOT DETECTED NOT DETECTED Final   Parainfluenza Virus 1 NOT DETECTED NOT DETECTED Final   Parainfluenza Virus 2 NOT DETECTED NOT DETECTED Final   Parainfluenza Virus 3 NOT DETECTED NOT DETECTED Final   Parainfluenza Virus 4 NOT DETECTED NOT DETECTED Final   Respiratory Syncytial Virus NOT DETECTED NOT DETECTED Final   Bordetella pertussis NOT DETECTED NOT DETECTED Final   Bordetella Parapertussis NOT DETECTED NOT DETECTED Final   Chlamydophila pneumoniae NOT DETECTED NOT DETECTED Final   Mycoplasma pneumoniae NOT DETECTED NOT DETECTED Final    Comment: Performed at [facility], [address], [place], [place] [address]    Gardiner Barefoot, MD [facility] for Infectious Disease [facility] [email or web address] 03/30/2021, 10:51 AM

## 2021-03-30 NOTE — Progress Notes (Signed)
Mobility Specialist Progress Note:   03/30/21 1143  Mobility  Bed Position Chair  Activity Ambulated with assistance in hallway  Level of Assistance Standby assist, set-up cues, supervision of patient - no hands on  Assistive Device None (IV pole)  Distance Ambulated (ft) 570 ft  Activity Response Tolerated well  $Mobility charge 1 Mobility   Pt received in bed willing to participate in mobility. No complaints of pain and asymptomatic. Pt left in chair with call bell in reach and all needs met.   [facility] Public librarian Phone [phone]

## 2021-03-31 ENCOUNTER — Inpatient Hospital Stay (HOSPITAL_COMMUNITY): Payer: Medicare Other

## 2021-03-31 DIAGNOSIS — R509 Fever, unspecified: Secondary | ICD-10-CM | POA: Diagnosis not present

## 2021-03-31 DIAGNOSIS — J189 Pneumonia, unspecified organism: Secondary | ICD-10-CM | POA: Diagnosis not present

## 2021-03-31 DIAGNOSIS — R6520 Severe sepsis without septic shock: Secondary | ICD-10-CM | POA: Diagnosis not present

## 2021-03-31 DIAGNOSIS — E1149 Type 2 diabetes mellitus with other diabetic neurological complication: Secondary | ICD-10-CM | POA: Diagnosis not present

## 2021-03-31 DIAGNOSIS — E039 Hypothyroidism, unspecified: Secondary | ICD-10-CM | POA: Diagnosis not present

## 2021-03-31 LAB — GLUCOSE, CAPILLARY
Glucose-Capillary: 221 mg/dL — ABNORMAL HIGH (ref 70–99)
Glucose-Capillary: 124 mg/dL — ABNORMAL HIGH (ref 70–99)

## 2021-03-31 LAB — PROCALCITONIN: Procalcitonin: 8.44 ng/mL

## 2021-03-31 LAB — D-DIMER, QUANTITATIVE: D-Dimer, Quant: 1.94 ug/mL-FEU — ABNORMAL HIGH (ref 0.00–0.50)

## 2021-03-31 IMAGING — NM NM PULMONARY VENT & PERF
8 series · 8 of 8 positions shown · non-contrast
Comparison: None.

CLINICAL DATA: Positive D-dimer. Concern for pulmonary embolism.
Short of breath

EXAM:
NUCLEAR MEDICINE PERFUSION LUNG SCAN
TECHNIQUE: Perfusion images were obtained in multiple projections after
intravenous injection of radiopharmaceutical.
Ventilation scans intentionally deferred if perfusion scan and chest
x-ray adequate for interpretation during COVID 19 epidemic.
RADIOPHARMACEUTICALS:  4.2 mCi [OJ] MAA IV

[Series 1: ant/post perf · 4.14mm/px · 1 of 1 slices shown (1 of 2)]
[im 1/1]
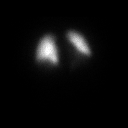

[Series 1: ant/post perf · 4.14mm/px · 1 of 1 slices shown (2 of 2)]
[im 1/1]
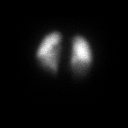

[Series 2: lao/rpo perf · 4.14mm/px · 1 of 1 slices shown (1 of 2)]
[im 1/1]
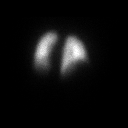

[Series 2: lao/rpo perf · 4.14mm/px · 1 of 1 slices shown (2 of 2)]
[im 1/1]
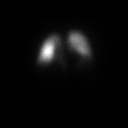

[Series 3: lpo/rao perf · 4.14mm/px · 1 of 1 slices shown (1 of 2)]
[im 1/1]
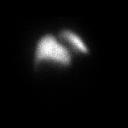

[Series 3: lpo/rao perf · 4.14mm/px · 1 of 1 slices shown (2 of 2)]
[im 1/1]
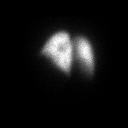

[Series 4: lt lat/rt lat perf · 4.14mm/px · 1 of 1 slices shown (1 of 2)]
[im 1/1]
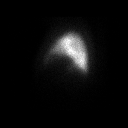

[Series 4: lt lat/rt lat perf · 4.14mm/px · 1 of 1 slices shown (2 of 2)]
[im 1/1]
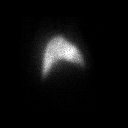

[8 of 8 positions shown; findings below may reference images not displayed]

FINDINGS: No wedge-shaped peripheral perfusion defect within LEFT or RIGHT
lung. Uniform perfusion to LEFT and RIGHT lung.
IMPRESSION: Normal lung perfusion.  No evidence acute pulmonary embolism.

## 2021-03-31 IMAGING — DX DG CHEST 2V
2 series · 2 of 2 positions shown · non-contrast
Comparison: [DATE] chest radiograph.

CLINICAL DATA: Weakness, altered mental status

EXAM:
CHEST - 2 VIEW

[chest pa]
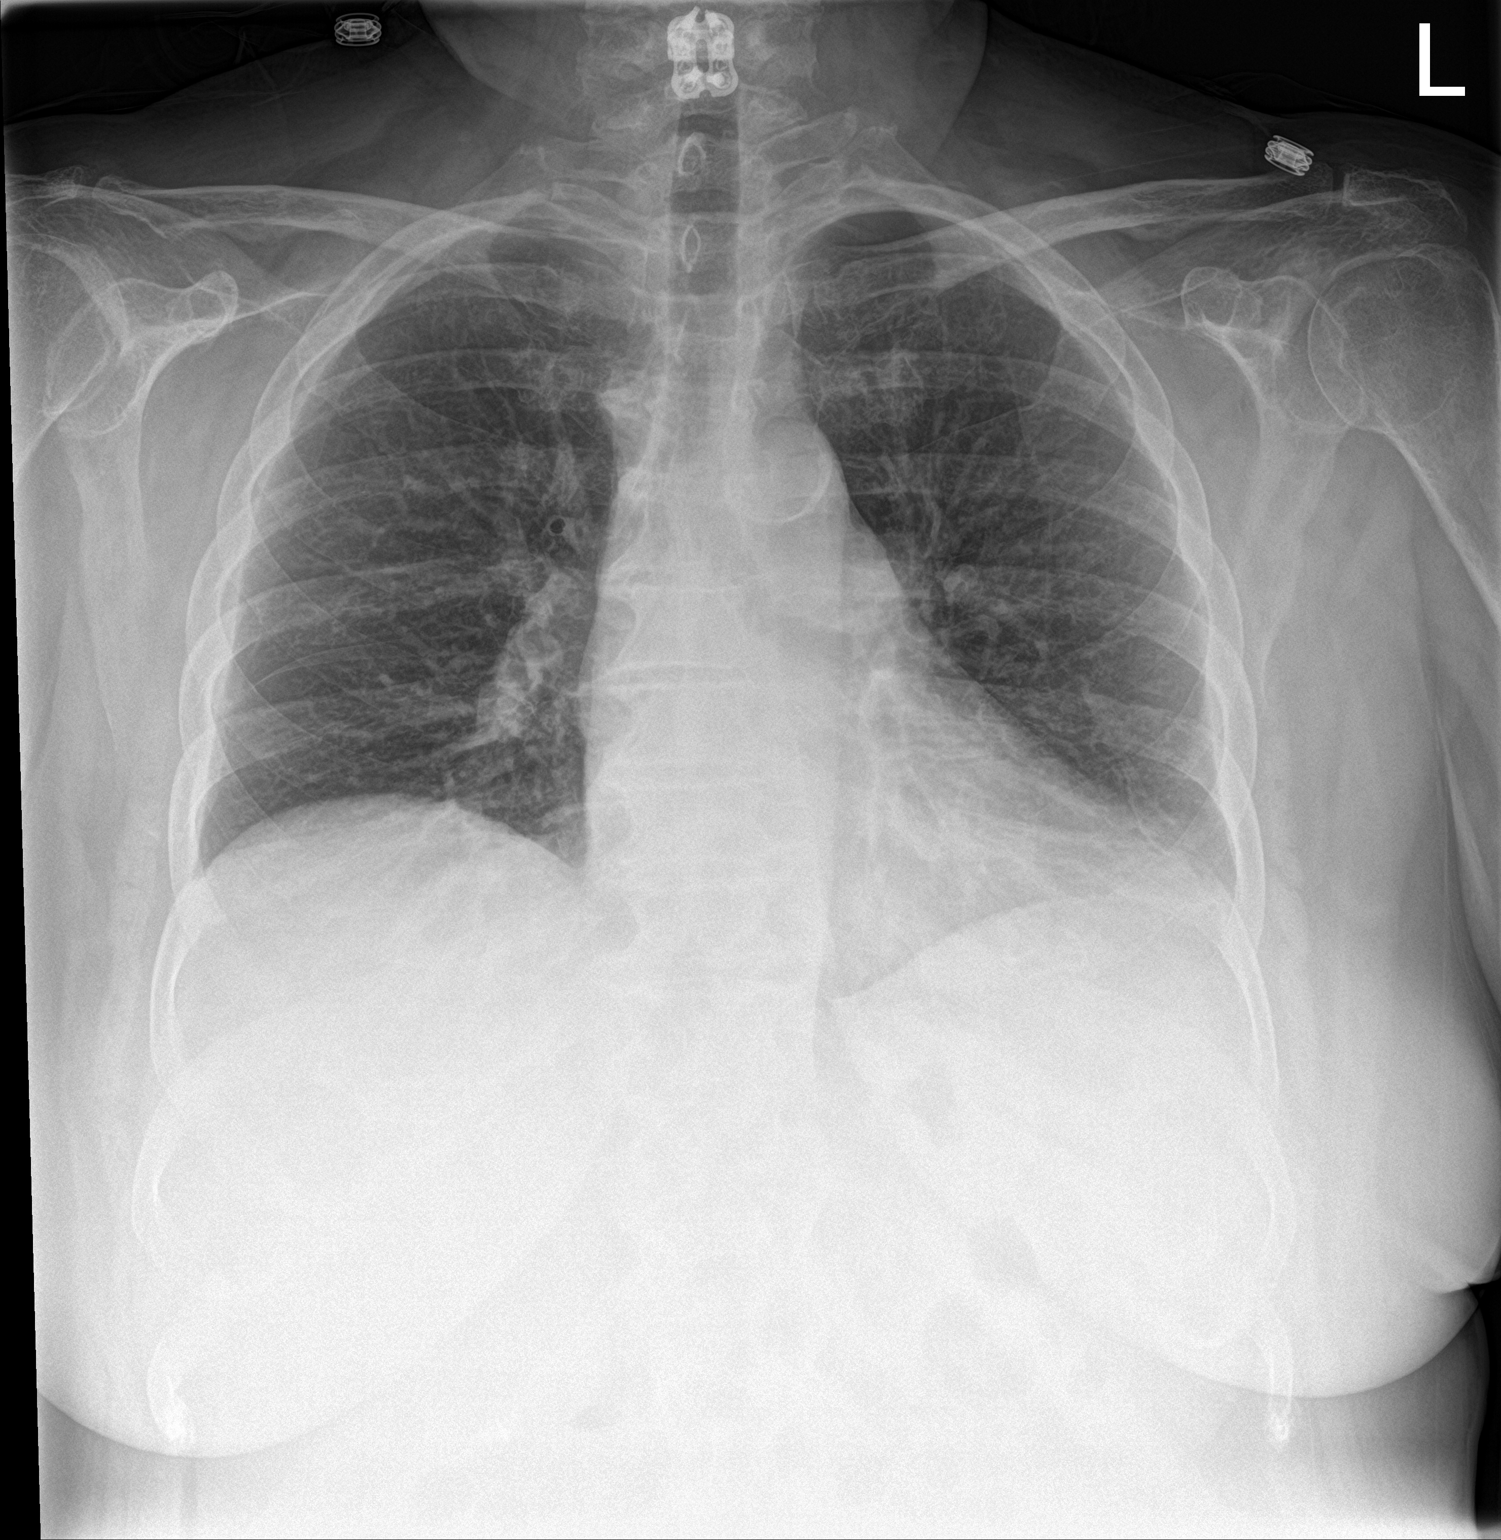

[chest lat]
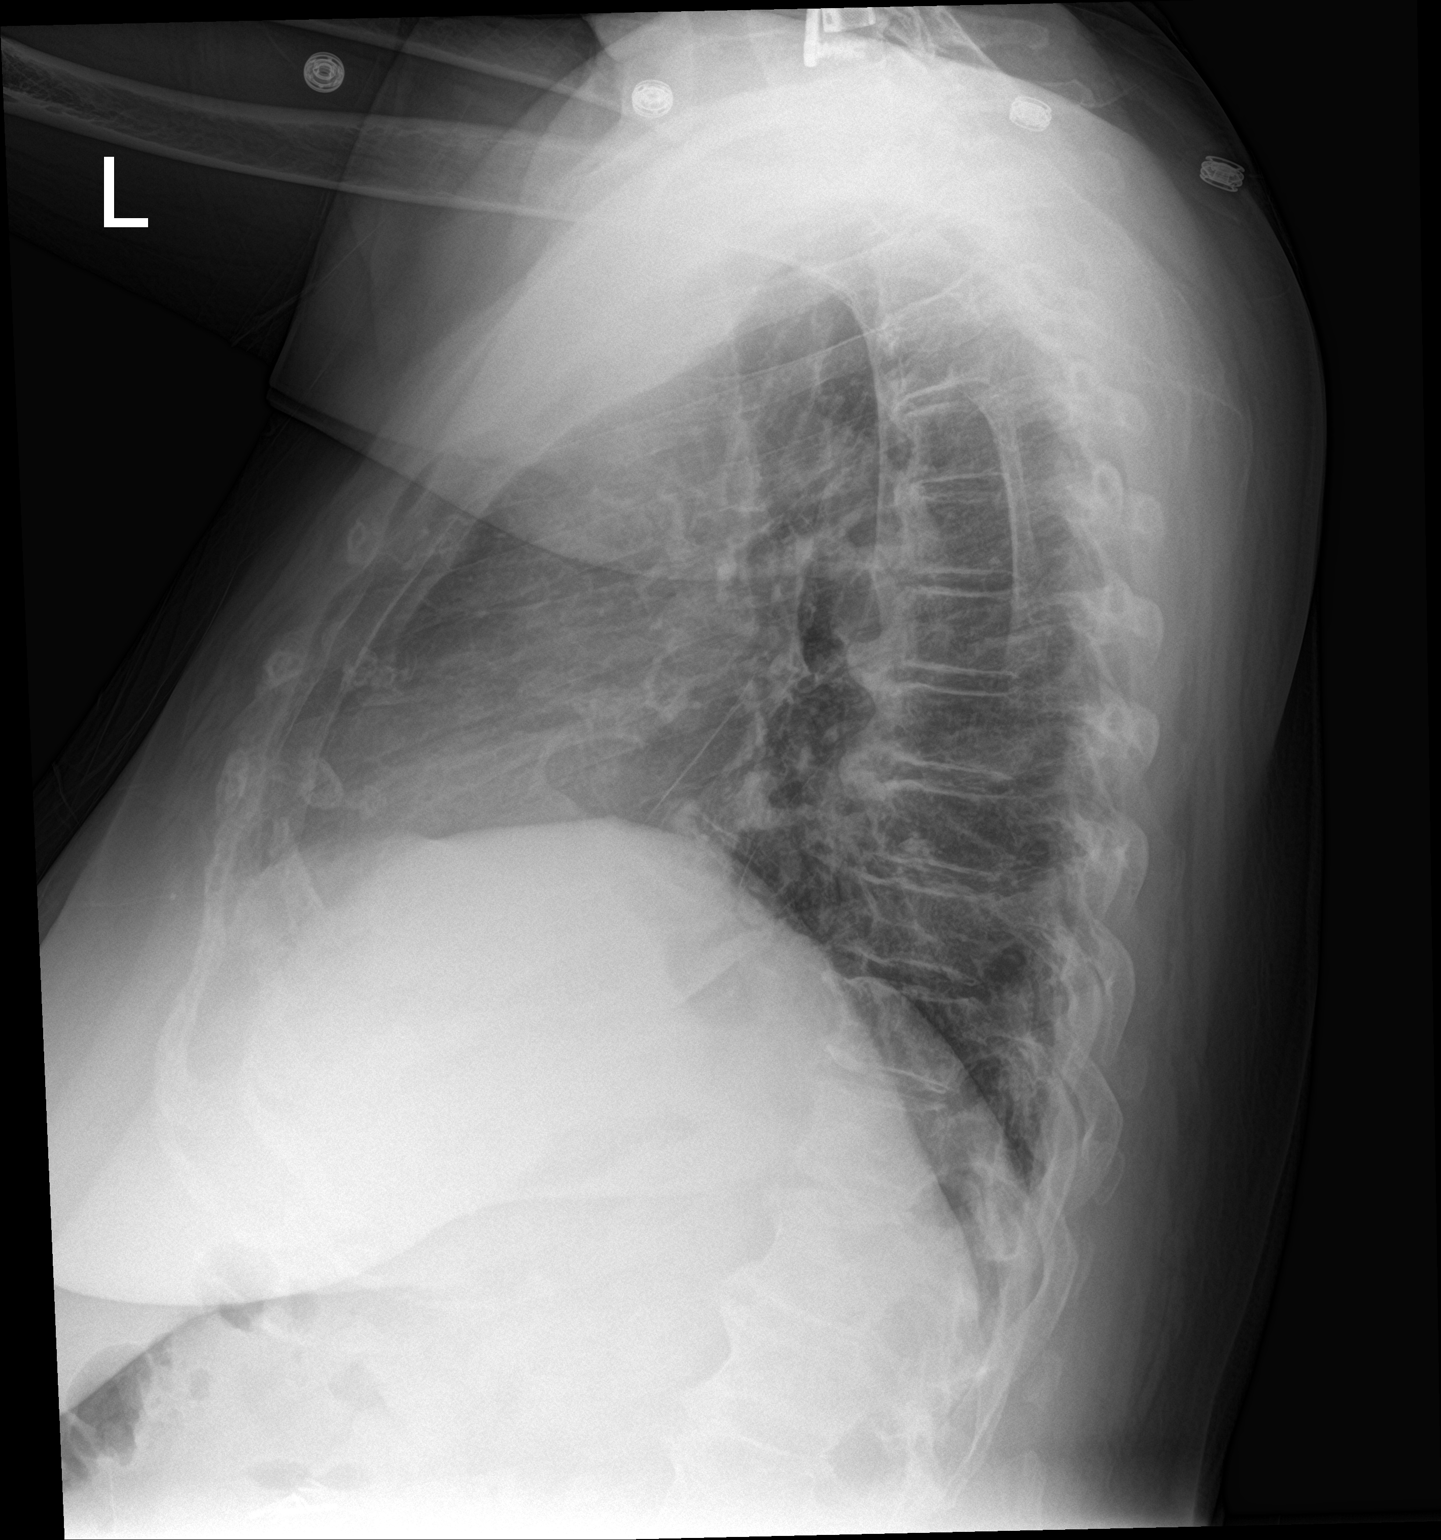

[2 of 2 positions shown; findings below may reference images not displayed]

FINDINGS: Surgical hardware from ACDF overlies the lower cervical spine.
Stable cardiomediastinal silhouette with normal heart size. No
pneumothorax. No pleural effusion. Mild left basilar atelectasis,
unchanged from [DATE] chest CT. No pulmonary edema. No acute
consolidative airspace disease.
IMPRESSION: Mild left basilar atelectasis, unchanged.

## 2021-03-31 MED ORDER — TECHNETIUM TO 99M ALBUMIN AGGREGATED
4.2000 | Freq: Once | INTRAVENOUS | Status: AC | PRN
  Administered 2021-03-31: 4.2 via INTRAVENOUS

## 2021-03-31 MED ORDER — RISAQUAD PO CAPS: 1.0000 | ORAL_CAPSULE | Freq: Every day | ORAL | Status: DC

## 2021-03-31 NOTE — Progress Notes (Signed)
Patient c/o inability to get a deep breath, chest tightness and SOB-- not improved with incentive spirometry.  Will check d  dimer and get V/Q scan if abnormal. Marlin Canary DO

## 2021-03-31 NOTE — Care Management Important Message (Signed)
Important Message  Patient Details  Name: Lindsay Rice MRN: [id number] Date of Birth: [birth year]   Medicare Important Message Given:  Yes     Hannah Beat 03/31/2021, 11:44 AM

## 2021-03-31 NOTE — Progress Notes (Signed)
[facility]  Date of Admission:  03/28/2021     Total days of antibiotics 2         ASSESSMENT:  Ms. Hrivnak has cough with no other symptoms at present. Primary team checking V/Q scan and getting d-dimer. Blood cultures without growth to date. There continues to be no clear evidence of infection at this point and recommend to continue to monitor off antibiotics. Remaining medical and supportive care per primary team.   ID will sign off. Please re-consult as needed.   PLAN:  Continue to monitor off antibiotics with no clear evidence of infection V/Q scan and D-dimer to check for PE Remaining medical and supportive care per primary team.  ID will sign off.   Principal Problem:   Sepsis with acute organ dysfunction without septic shock (HCC) Active Problems:   Essential hypertension   Diabetes mellitus (HCC)   Left hemiparesis (HCC)   Fever, unknown origin   Acquired hypothyroidism   Chronic headache   Fever    acidophilus  1 capsule Oral Daily   amitriptyline  25 mg Oral QHS   amLODipine  10 mg Oral Daily   atorvastatin  80 mg Oral Daily   busPIRone  5 mg Oral BID   clopidogrel  75 mg Oral Daily   famotidine  40 mg Oral QHS   gabapentin  300 mg Oral BID   insulin aspart  0-15 Units Subcutaneous TID WC   insulin aspart  0-5 Units Subcutaneous QHS   levothyroxine  75 mcg Oral QAC breakfast   metoprolol succinate  25 mg Oral Daily   mometasone-formoterol  2 puff Inhalation BID   sertraline  50 mg Oral Daily    SUBJECTIVE:  Afebrile overnight with no acute events. Feeling well other than cough with occasional chest pain.    Allergies  Allergen Reactions   Iodinated Contrast Media Hives, Shortness Of Breath, Rash and Other (See Comments)    Required an Epi-pen   Iodine Hives, Shortness Of Breath and Rash   Shellfish-Derived Products Hives and Shortness Of Breath   Cefaclor Rash   Cheese Other (See Comments)    Headaches/migraines from  Swiss and Parmesan only    Covid-19 (Mrna) Vaccine Nausea And Vomiting and Other (See Comments)    Severe Weakness, also   Levofloxacin Hives and Rash   Penicillins Hives and Rash   Wound Dressing Adhesive Other (See Comments)    Paper Tape ok Other reaction(s): Other Paper Tape ok     Review of Systems: Review of Systems  Constitutional:  Negative for chills, fever and weight loss.  Respiratory:  Positive for cough. Negative for shortness of breath and wheezing.   Cardiovascular:  Negative for chest pain and leg swelling.  Gastrointestinal:  Negative for abdominal pain, constipation, diarrhea, nausea and vomiting.  Skin:  Negative for rash.     OBJECTIVE: Vitals:   03/30/21 2143 03/31/21 0616 03/31/21 0802 03/31/21 0847  BP: (!) 152/74 (!) 158/83  (!) 148/72  Pulse: 100 93  (!) 101  Resp: 18 17  19   Temp: 98.7 F (37.1 C) 99 F (37.2 C)  98.2 F (36.8 C)  TempSrc: Oral Oral  Oral  SpO2: 96% 93% 94% 97%  Weight:      Height:       Body mass index is 33.49 kg/m.  Physical Exam Constitutional:      General: She is not in acute distress.    Appearance: She is  well-developed.     Comments: Lying in bed with head of bed elevated; pleasant.   Cardiovascular:     Rate and Rhythm: Regular rhythm. Tachycardia present.     Heart sounds: Normal heart sounds.  Pulmonary:     Effort: Pulmonary effort is normal.     Breath sounds: Normal breath sounds.  Skin:    General: Skin is warm and dry.  Neurological:     Mental Status: She is alert and oriented to person, place, and time.  Psychiatric:        Mood and Affect: Mood normal.        Behavior: Behavior normal.        Thought Content: Thought content normal.        Judgment: Judgment normal.    Lab Results Lab Results  Component Value Date   WBC 5.5 03/30/2021   HGB 10.4 (L) 03/30/2021   HCT 31.2 (L) 03/30/2021   MCV 83.9 03/30/2021   PLT 178 03/30/2021    Lab Results  Component Value Date   CREATININE  0.91 03/30/2021   BUN 12 03/30/2021   NA 140 03/30/2021   K 3.7 03/30/2021   CL 109 03/30/2021   CO2 22 03/30/2021    Lab Results  Component Value Date   ALT 36 03/28/2021   AST 48 (H) 03/28/2021   ALKPHOS 69 03/28/2021   BILITOT 0.4 03/28/2021     Microbiology: Recent Results (from the past 240 hour(s))  Blood Culture (routine x 2)     Status: None (Preliminary result)   Collection Time: 03/28/21  8:38 PM   Specimen: BLOOD  Result Value Ref Range Status   Specimen Description BLOOD LEFT ANTECUBITAL  Final   Special Requests   Final    BOTTLES DRAWN AEROBIC AND ANAEROBIC Blood Culture adequate volume   Culture   Final    NO GROWTH 3 DAYS Performed at [facility], [address] [place], [place] [address]    Report Status PENDING  Incomplete  Blood Culture (routine x 2)     Status: None (Preliminary result)   Collection Time: 03/28/21  9:02 PM   Specimen: BLOOD  Result Value Ref Range Status   Specimen Description BLOOD SITE NOT SPECIFIED  Final   Special Requests   Final    BOTTLES DRAWN AEROBIC AND ANAEROBIC Blood Culture adequate volume   Culture   Final    NO GROWTH 3 DAYS Performed at [facility], [address] [place], [place] [address]    Report Status PENDING  Incomplete  Resp Panel by RT-PCR (Flu A&B, Covid) Nasopharyngeal Swab     Status: None   Collection Time: 03/28/21 10:36 PM   Specimen: Nasopharyngeal Swab; Nasopharyngeal(NP) swabs in vial transport medium  Result Value Ref Range Status   SARS Coronavirus 2 by RT PCR NEGATIVE NEGATIVE Final    Comment: (NOTE) SARS-CoV-2 target nucleic acids are NOT DETECTED.  The SARS-CoV-2 RNA is generally detectable in upper respiratory specimens during the acute phase of infection. The lowest concentration of SARS-CoV-2 viral copies this assay can detect is 138 copies/mL. A negative result does not preclude SARS-Cov-2 infection and should not be used as the sole basis for treatment or other  patient management decisions. A negative result may occur with  improper specimen collection/handling, submission of specimen other than nasopharyngeal swab, presence of viral mutation(s) within the areas targeted by this assay, and inadequate number of viral copies(<138 copies/mL). A negative result must  be combined with clinical observations, patient history, and epidemiological information. The expected result is Negative.  Fact Sheet for Patients:  BloggerCourse.com  Fact Sheet for Healthcare Providers:  SeriousBroker.it  This test is no t yet approved or cleared by the [place] FDA and  has been authorized for detection and/or diagnosis of SARS-CoV-2 by FDA under an Emergency Use Authorization (EUA). This EUA will remain  in effect (meaning this test can be used) for the duration of the COVID-19 declaration under Section 564(b)(1) of the Act, 21 U.S.C.section 360bbb-3(b)(1), unless the authorization is terminated  or revoked sooner.       Influenza A by PCR NEGATIVE NEGATIVE Final   Influenza B by PCR NEGATIVE NEGATIVE Final    Comment: (NOTE) The Xpert Xpress SARS-CoV-2/FLU/RSV plus assay is intended as an aid in the diagnosis of influenza from Nasopharyngeal swab specimens and should not be used as a sole basis for treatment. Nasal washings and aspirates are unacceptable for Xpert Xpress SARS-CoV-2/FLU/RSV testing.  Fact Sheet for Patients: BloggerCourse.com  Fact Sheet for Healthcare Providers: SeriousBroker.it  This test is not yet approved or cleared by the [place] FDA and has been authorized for detection and/or diagnosis of SARS-CoV-2 by FDA under an Emergency Use Authorization (EUA). This EUA will remain in effect (meaning this test can be used) for the duration of the COVID-19 declaration under Section 564(b)(1) of the Act, 21 U.S.C. section  360bbb-3(b)(1), unless the authorization is terminated or revoked.  Performed at [facility], [address], [place], [place] [address]   Urine Culture     Status: None   Collection Time: 03/29/21 12:35 AM   Specimen: In/Out Cath Urine  Result Value Ref Range Status   Specimen Description IN/OUT CATH URINE  Final   Special Requests NONE  Final   Culture   Final    NO GROWTH Performed at [facility], [address], [place], [place] [address]    Report Status 03/30/2021 FINAL  Final  Respiratory (~20 pathogens) panel by PCR     Status: None   Collection Time: 03/29/21  1:52 PM   Specimen: Nasopharyngeal Swab; Respiratory  Result Value Ref Range Status   Adenovirus NOT DETECTED NOT DETECTED Final   Coronavirus 229E NOT DETECTED NOT DETECTED Final    Comment: (NOTE) The Coronavirus on the Respiratory Panel, DOES NOT test for the novel  Coronavirus (2019 nCoV)    Coronavirus HKU1 NOT DETECTED NOT DETECTED Final   Coronavirus NL63 NOT DETECTED NOT DETECTED Final   Coronavirus OC43 NOT DETECTED NOT DETECTED Final   Metapneumovirus NOT DETECTED NOT DETECTED Final   Rhinovirus / Enterovirus NOT DETECTED NOT DETECTED Final   Influenza A NOT DETECTED NOT DETECTED Final   Influenza B NOT DETECTED NOT DETECTED Final   Parainfluenza Virus 1 NOT DETECTED NOT DETECTED Final   Parainfluenza Virus 2 NOT DETECTED NOT DETECTED Final   Parainfluenza Virus 3 NOT DETECTED NOT DETECTED Final   Parainfluenza Virus 4 NOT DETECTED NOT DETECTED Final   Respiratory Syncytial Virus NOT DETECTED NOT DETECTED Final   Bordetella pertussis NOT DETECTED NOT DETECTED Final   Bordetella Parapertussis NOT DETECTED NOT DETECTED Final   Chlamydophila pneumoniae NOT DETECTED NOT DETECTED Final   Mycoplasma pneumoniae NOT DETECTED NOT DETECTED Final    Comment: Performed at [facility], [address], [place], [place] [address]     Marcos Eke, NP [facility]  03/31/2021  10:38 AM

## 2021-03-31 NOTE — Discharge Summary (Signed)
Physician Discharge Summary   Patient: Lindsay Rice MRN: [id number] DOB: [birth year]  Admit date:     03/28/2021  Discharge date: 03/31/21  Discharge Physician: Lindsay Rice   PCP: Lindsay Fuchs, PA   Recommendations at discharge:   Regular follow up  Discharge Diagnoses: Principal Problem:   Sepsis with acute organ dysfunction without septic shock [facility]) Active Problems:   Essential hypertension   Diabetes mellitus ([place])   Left hemiparesis ([place])   Fever, unknown origin   Acquired hypothyroidism   Chronic headache  Resolved Problems:   * No resolved hospital problems. *   Hospital Course: Lindsay Rice is a 71 y.o. female with a history of aseptic meningitis from unclear etiology and treated with a prednisone taper who presented with altered mentation and fever.  She was at home and does not remember much but was acutely rigorous and altered and brought in by EMS.  Her initial blood pressure was 200/80, tachycardic and some hypoxia and fever to 102.4.  she was started on empiric antibiotics and admitted.  She does not remember the events until she was in the hospital room.  She feels she is back to her baseline now.  She has had no headache.  No neck pain.    Assessment and Plan: * Sepsis with acute organ dysfunction without septic shock ([place])- (present on admission) -resolved quickly -no true source found so NOT sepsis -ID consult -d/c abx -resume plavix  Chronic headache- (present on admission) Patient sees neurology.  She is on Elavil at night.  Acquired hypothyroidism- (present on admission) Continue Synthroid.  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  Strength is pretty good on the left side now.  Diabetes mellitus ([place]) -Placed on sliding scale insulin.  Essential hypertension- (present on admission) Continue her  Norvasc.      Consultants: ID  Disposition: Home Diet recommendation:  Discharge Diet Orders (From admission, onward)     Start     Ordered   03/31/21 0000  Diet - low sodium heart healthy        03/31/21 1404   03/31/21 0000  Diet Carb Modified        03/31/21 1404             DISCHARGE MEDICATION: Allergies as of 03/31/2021       Reactions   Iodinated Contrast Media Hives, Shortness Of Breath, Rash, Other (See Comments)   Required an Epi-pen   Iodine Hives, Shortness Of Breath, Rash   Shellfish-derived Products Hives, Shortness Of Breath   Cefaclor Rash   Cheese Other (See Comments)   Headaches/migraines from Swiss and Parmesan only   Covid-19 (mrna) Vaccine Nausea And Vomiting, Other (See Comments)   Severe Weakness, also   Levofloxacin Hives, Rash   Penicillins Hives, Rash   Wound Dressing Adhesive Other (See Comments)   Paper Tape ok Other reaction(s): Other Paper Tape ok        Medication List     STOP taking these medications    aspirin 81 MG chewable tablet   empagliflozin 25 MG Tabs tablet Commonly known as: JARDIANCE   ibuprofen 200 MG tablet Commonly known as: ADVIL   nitrofurantoin (macrocrystal-monohydrate) 100 MG capsule Commonly known as: MACROBID       TAKE these medications    Accu-Chek Softclix Lancets lancets Use as instructed   acetaminophen 500 MG tablet  Commonly known as: TYLENOL Take 1,000 mg by mouth every 6 (six) hours as needed for moderate pain or headache.   acidophilus Caps capsule Take 1 capsule by mouth daily. Start taking on: April 01, 2021   Ajovy 225 MG/1.5ML Sosy Generic drug: Fremanezumab-vfrm Inject 225 mg into the skin every 30 (thirty) days.   albuterol 108 (90 Base) MCG/ACT inhaler Commonly known as: VENTOLIN HFA Inhale 2 puffs into the lungs every 6 (six) hours as needed for wheezing or shortness of breath.   amitriptyline 25 MG tablet Commonly known as: ELAVIL Take 25 mg by mouth at  bedtime.   amLODipine 10 MG tablet Commonly known as: Norvasc Take 1 tablet (10 mg total) by mouth daily.   atorvastatin 80 MG tablet Commonly known as: LIPITOR Take 80 mg by mouth daily.   blood glucose meter kit and supplies Kit Dispense based on patient and insurance preference. Use up to four times daily as directed.   busPIRone 5 MG tablet Commonly known as: BUSPAR Take 5 mg by mouth 2 (two) times daily.   clopidogrel 75 MG tablet Commonly known as: PLAVIX Take 1 tablet (75 mg total) by mouth daily.   Cool Blood Glucose Test Strips test strip Generic drug: glucose blood Use as instructed What changed: when to take this   EPINEPHrine 0.3 mg/0.3 mL Soaj injection Commonly known as: EPI-PEN Inject 0.3 mg into the muscle once as needed for anaphylaxis.   famotidine 40 MG tablet Commonly known as: PEPCID Take 1 tablet (40 mg total) by mouth at bedtime.   fluticasone-salmeterol 115-21 MCG/ACT inhaler Commonly known as: ADVAIR HFA Inhale 2 puffs into the lungs 2 (two) times daily.   gabapentin 300 MG capsule Commonly known as: NEURONTIN Take 300 mg by mouth in the morning and at bedtime.   glimepiride 2 MG tablet Commonly known as: AMARYL Take 1 tablet (2 mg total) by mouth daily with breakfast.   Januvia 50 MG tablet Generic drug: sitaGLIPtin Take 50 mg by mouth daily.   levothyroxine 75 MCG tablet Commonly known as: SYNTHROID Take 75 mcg by mouth daily before breakfast.   methocarbamol 500 MG tablet Commonly known as: Robaxin Take 1 tablet (500 mg total) by mouth every 8 (eight) hours as needed for muscle spasms.   metoprolol succinate 25 MG 24 hr tablet Commonly known as: TOPROL-XL Take 25 mg by mouth daily.   ondansetron 4 MG disintegrating tablet Commonly known as: ZOFRAN-ODT Take 4 mg by mouth every 8 (eight) hours as needed for nausea or vomiting (dissolve orally).   senna-docusate 8.6-50 MG tablet Commonly known as: Senokot-S Take 1 tablet by  mouth at bedtime as needed for moderate constipation or mild constipation. What changed: when to take this   sertraline 50 MG tablet Commonly known as: ZOLOFT Take 50 mg by mouth daily.   triamcinolone cream 0.1 % Commonly known as: KENALOG Apply 1 application topically 2 (two) times daily as needed (rash).        Follow-up Information     Scherrie Gerlach E, PA Follow up in 1 week(s).   Specialty: Physician Assistant Contact information: [place] [address] [phone]                 Discharge Exam: Danley Danker Weights   03/29/21 0300  Weight: 91.3 kg   On side of bed, NAD  Condition at discharge: good  The results of significant diagnostics from this hospitalization (including imaging, microbiology, ancillary and laboratory) are  listed below for reference.   Imaging Studies: DG Chest 1 View  Result Date: 03/29/2021 CLINICAL DATA:  Pneumonia, weakness, altered mental status EXAM: CHEST  1 VIEW COMPARISON:  03/28/2021 FINDINGS: Shallow inspiration. Cardiac enlargement. No vascular congestion or edema. Probable atelectasis in the left base. No pleural effusions. No pneumothorax. Calcification of the aorta. Degenerative changes in the spine. IMPRESSION: Shallow inspiration with atelectasis in the left base. Cardiac enlargement. Electronically Signed   By: Lucienne Capers M.D.   On: 03/29/2021 01:15   DG Chest 2 View  Result Date: 03/31/2021 CLINICAL DATA:  Weakness, altered mental status EXAM: CHEST - 2 VIEW COMPARISON:  03/29/2021 chest radiograph. FINDINGS: Surgical hardware from ACDF overlies the lower cervical spine. Stable cardiomediastinal silhouette with normal heart size. No pneumothorax. No pleural effusion. Mild left basilar atelectasis, unchanged from 03/29/2021 chest CT. No pulmonary edema. No acute consolidative airspace disease. IMPRESSION: Mild left basilar atelectasis, unchanged. Electronically Signed   By: Ilona Sorrel M.D.   On: 03/31/2021  13:53   CT HEAD WO CONTRAST (5MM)  Result Date: 03/29/2021 CLINICAL DATA:  Delirium EXAM: CT HEAD WITHOUT CONTRAST TECHNIQUE: Contiguous axial images were obtained from the base of the skull through the vertex without intravenous contrast. RADIATION DOSE REDUCTION: This exam was performed according to the departmental dose-optimization program which includes automated exposure control, adjustment of the mA and/or kV according to patient size and/or use of iterative reconstruction technique. COMPARISON:  02/08/2021 FINDINGS: Brain: Old right internal capsule lacunar infarct, unchanged. No acute intracranial abnormality. Specifically, no hemorrhage, hydrocephalus, mass lesion, acute infarction, or significant intracranial injury. Vascular: No hyperdense vessel or unexpected calcification. Skull: No acute calvarial abnormality. Sinuses/Orbits: No acute findings Other: None IMPRESSION: No acute intracranial abnormality. Electronically Signed   By: Rolm Baptise M.D.   On: 03/29/2021 00:08   NM Pulmonary Perf and Vent  Result Date: 03/31/2021 CLINICAL DATA:  Positive D-dimer. Concern for pulmonary embolism. Short of breath EXAM: NUCLEAR MEDICINE PERFUSION LUNG SCAN TECHNIQUE: Perfusion images were obtained in multiple projections after intravenous injection of radiopharmaceutical. Ventilation scans intentionally deferred if perfusion scan and chest x-ray adequate for interpretation during COVID 19 epidemic. RADIOPHARMACEUTICALS:  4.2 mCi Tc-19mMAA IV COMPARISON:  None. FINDINGS: No wedge-shaped peripheral perfusion defect within LEFT or RIGHT lung. Uniform perfusion to LEFT and RIGHT lung. IMPRESSION: Normal lung perfusion.  No evidence acute pulmonary embolism. Electronically Signed   By: SSuzy BouchardM.D.   On: 03/31/2021 13:43   DG Chest Port 1 View  Result Date: 03/28/2021 CLINICAL DATA:  Sepsis workup. EXAM: PORTABLE CHEST 1 VIEW COMPARISON:  PA Lat 04/19/2017. FINDINGS: The cardiac size is normal.  Stable mediastinal configuration with calcification in the aortic knob. The lungs are hypoexpanded. There is increased opacity in the lateral left base which could be due to atelectasis or a small pneumonia. The remaining hypoinflated lungs are clear with mild elevation right hemidiaphragm. No pleural effusion is seen. Thoracic spondylosis and degenerative disc disease. IMPRESSION: Limited exam due to low inspiration. Questionable increased opacity lateral left base, could be atelectasis or pneumonia. PA Lat study in full inspiration recommended. Aortic atherosclerosis. Electronically Signed   By: KTelford NabM.D.   On: 03/28/2021 21:35   ECHOCARDIOGRAM COMPLETE  Result Date: 03/29/2021    ECHOCARDIOGRAM REPORT   Patient Name:   LDORIAN RENFRODate of Exam: 03/29/2021 Medical Rec #:  [id number]     Height:       65.0 in Accession #:  [id number]     Weight:       201.3 lb Date of Birth:  03/18/1950       BSA:          1.984 m Patient Age:    37 years       BP:           122/73 mmHg Patient Gender: F              HR:           101 bpm. Exam Location:  Inpatient Procedure: 2D Echo Indications:    Fever  History:        Patient has prior history of Echocardiogram examinations, most                 recent 02/08/2021. Risk Factors:Hypertension and Diabetes.  Sonographer:    Jefferey Pica Referring Phys: [place]  1. No significant LVOT obstruction. Left ventricular ejection fraction, by estimation, is 60 to 65%. The left ventricle has normal function. The left ventricle has no regional wall motion abnormalities. There is mild asymmetric left ventricular hypertrophy of the basal segment. Left ventricular diastolic parameters are consistent with Grade I diastolic dysfunction (impaired relaxation).  2. Right ventricular systolic function is normal. The right ventricular size is normal.  3. The mitral valve is grossly normal. No evidence of mitral valve regurgitation. Moderate mitral annular  calcification.  4. The aortic valve is grossly normal. Aortic valve regurgitation is not visualized.  5. The inferior vena cava is normal in size with greater than 50% respiratory variability, suggesting right atrial pressure of 3 mmHg. Comparison(s): No significant change from prior study. FINDINGS  Left Ventricle: No significant LVOT obstruction. Left ventricular ejection fraction, by estimation, is 60 to 65%. The left ventricle has normal function. The left ventricle has no regional wall motion abnormalities. The left ventricular internal cavity size was normal in size. There is mild asymmetric left ventricular hypertrophy of the basal segment. Left ventricular diastolic parameters are consistent with Grade I diastolic dysfunction (impaired relaxation). Right Ventricle: The right ventricular size is normal. No increase in right ventricular wall thickness. Right ventricular systolic function is normal. Left Atrium: Left atrial size was normal in size. Right Atrium: Right atrial size was normal in size. Pericardium: There is no evidence of pericardial effusion. Mitral Valve: The mitral valve is grossly normal. Moderate mitral annular calcification. No evidence of mitral valve regurgitation. Tricuspid Valve: The tricuspid valve is normal in structure. Tricuspid valve regurgitation is not demonstrated. Aortic Valve: The aortic valve is grossly normal. Aortic valve regurgitation is not visualized. Aortic valve peak gradient measures 11.2 mmHg. Pulmonic Valve: Pulmonic valve regurgitation is not visualized. Aorta: The aortic root and ascending aorta are structurally normal, with no evidence of dilitation. Venous: The inferior vena cava is normal in size with greater than 50% respiratory variability, suggesting right atrial pressure of 3 mmHg. IAS/Shunts: No atrial level shunt detected by color flow Doppler.  LEFT VENTRICLE PLAX 2D LVIDd:         3.50 cm   Diastology LVIDs:         2.40 cm   LV e' medial:    6.25 cm/s  LV PW:         1.20 cm   LV E/e' medial:  14.4 LV IVS:        1.30 cm   LV e' lateral:   6.46 cm/s LVOT diam:     2.10 cm  LV E/e' lateral: 13.9 LV SV:         78 LV SV Index:   39 LVOT Area:     3.46 cm  RIGHT VENTRICLE             IVC RV Basal diam:  2.60 cm     IVC diam: 2.00 cm RV S prime:     13.50 cm/s TAPSE (M-mode): 1.7 cm LEFT ATRIUM             Index        RIGHT ATRIUM           Index LA diam:        3.50 cm 1.76 cm/m   RA Area:     14.10 cm LA Vol (A2C):   54.4 ml 27.43 ml/m  RA Volume:   34.30 ml  17.29 ml/m LA Vol (A4C):   45.0 ml 22.69 ml/m LA Biplane Vol: 52.1 ml 26.27 ml/m  AORTIC VALVE                 PULMONIC VALVE AV Area (Vmax): 2.80 cm     PV Vmax:       1.17 m/s AV Vmax:        167.00 cm/s  PV Peak grad:  5.5 mmHg AV Peak Grad:   11.2 mmHg LVOT Vmax:      135.00 cm/s LVOT Vmean:     80.100 cm/s LVOT VTI:       0.224 m  AORTA Ao Root diam: 3.30 cm Ao Asc diam:  3.20 cm MITRAL VALVE MV Area (PHT): 4.54 cm     SHUNTS MV Decel Time: 167 msec     Systemic VTI:  0.22 m MV E velocity: 89.70 cm/s   Systemic Diam: 2.10 cm MV A velocity: 119.00 cm/s MV E/A ratio:  0.75 Landscape architect signed by Phineas Inches Signature Date/Time: 03/29/2021/3:38:06 PM    Final    CT CHEST ABDOMEN PELVIS WO CONTRAST  Result Date: 03/29/2021 CLINICAL DATA:  Sepsis, UTI EXAM: CT CHEST, ABDOMEN AND PELVIS WITHOUT CONTRAST TECHNIQUE: Multidetector CT imaging of the chest, abdomen and pelvis was performed following the standard protocol without IV contrast. RADIATION DOSE REDUCTION: This exam was performed according to the departmental dose-optimization program which includes automated exposure control, adjustment of the mA and/or kV according to patient size and/or use of iterative reconstruction technique. COMPARISON:  None. FINDINGS: CT CHEST FINDINGS Cardiovascular: Heart is normal size. Aorta is normal caliber. Aortic calcifications. Mediastinum/Nodes: No mediastinal, hilar, or axillary adenopathy.  Trachea and esophagus are unremarkable. Thyroid unremarkable. Lungs/Pleura: Minimal bibasilar atelectasis. No confluent opacities or effusions. Musculoskeletal: Chest wall soft tissues are unremarkable. No acute bony abnormality CT ABDOMEN PELVIS FINDINGS Hepatobiliary: Prior cholecystectomy. Diffuse fatty infiltration of the liver. No focal hepatic abnormality. Pancreas: No focal abnormality or ductal dilatation. Spleen: No focal abnormality.  Normal size. Adrenals/Urinary Tract: Punctate 1-2 mm nonobstructing stone in the lower pole of the left kidney. No ureteral stones or hydronephrosis. Adrenal glands and urinary bladder unremarkable. Stomach/Bowel: Normal appendix. Stomach, large and small bowel grossly unremarkable. Vascular/Lymphatic: Aortic atherosclerosis. No evidence of aneurysm or adenopathy. Reproductive: Prior hysterectomy.  No adnexal masses. Other: No free fluid or free air. Left lateral lower abdominal wall hernia containing fat. Musculoskeletal: No acute bony abnormality. IMPRESSION: No acute cardiopulmonary disease. Bibasilar atelectasis. Aortic atherosclerosis. Hepatic steatosis. Punctate left lower pole nephrolithiasis. Electronically Signed   By: Rolm Baptise M.D.   On: 03/29/2021 02:25  Microbiology: Results for orders placed or performed during the hospital encounter of 03/28/21  Blood Culture (routine x 2)     Status: None (Preliminary result)   Collection Time: 03/28/21  8:38 PM   Specimen: BLOOD  Result Value Ref Range Status   Specimen Description BLOOD LEFT ANTECUBITAL  Final   Special Requests   Final    BOTTLES DRAWN AEROBIC AND ANAEROBIC Blood Culture adequate volume   Culture   Final    NO GROWTH 3 DAYS Performed at [facility], [place] [address] [place], [place] [address]    Report Status PENDING  Incomplete  Blood Culture (routine x 2)     Status: None (Preliminary result)   Collection Time: 03/28/21  9:02 PM   Specimen: BLOOD  Result Value Ref Range  Status   Specimen Description BLOOD SITE NOT SPECIFIED  Final   Special Requests   Final    BOTTLES DRAWN AEROBIC AND ANAEROBIC Blood Culture adequate volume   Culture   Final    NO GROWTH 3 DAYS Performed at [facility], [address], [place], [place] [address]    Report Status PENDING  Incomplete  Resp Panel by RT-PCR (Flu A&B, Covid) Nasopharyngeal Swab     Status: None   Collection Time: 03/28/21 10:36 PM   Specimen: Nasopharyngeal Swab; Nasopharyngeal(NP) swabs in vial transport medium  Result Value Ref Range Status   SARS Coronavirus 2 by RT PCR NEGATIVE NEGATIVE Final    Comment: (NOTE) SARS-CoV-2 target nucleic acids are NOT DETECTED.  The SARS-CoV-2 RNA is generally detectable in upper respiratory specimens during the acute phase of infection. The lowest concentration of SARS-CoV-2 viral copies this assay can detect is 138 copies/mL. A negative result does not preclude SARS-Cov-2 infection and should not be used as the sole basis for treatment or other patient management decisions. A negative result may occur with  improper specimen collection/handling, submission of specimen other than nasopharyngeal swab, presence of viral mutation(s) within the areas targeted by this assay, and inadequate number of viral copies(<138 copies/mL). A negative result must be combined with clinical observations, patient history, and epidemiological information. The expected result is Negative.  Fact Sheet for Patients:  EntrepreneurPulse.com.au  Fact Sheet for Healthcare Providers:  IncredibleEmployment.be  This test is no t yet approved or cleared by the [place] FDA and  has been authorized for detection and/or diagnosis of SARS-CoV-2 by FDA under an Emergency Use Authorization (EUA). This EUA will remain  in effect (meaning this test can be used) for the duration of the COVID-19 declaration under Section 564(b)(1) of the Act,  21 U.S.C.section 360bbb-3(b)(1), unless the authorization is terminated  or revoked sooner.       Influenza A by PCR NEGATIVE NEGATIVE Final   Influenza B by PCR NEGATIVE NEGATIVE Final    Comment: (NOTE) The Xpert Xpress SARS-CoV-2/FLU/RSV plus assay is intended as an aid in the diagnosis of influenza from Nasopharyngeal swab specimens and should not be used as a sole basis for treatment. Nasal washings and aspirates are unacceptable for Xpert Xpress SARS-CoV-2/FLU/RSV testing.  Fact Sheet for Patients: EntrepreneurPulse.com.au  Fact Sheet for Healthcare Providers: IncredibleEmployment.be  This test is not yet approved or cleared by the [place] FDA and has been authorized for detection and/or diagnosis of SARS-CoV-2 by FDA under an Emergency Use Authorization (EUA). This EUA will remain in effect (meaning this test can be used) for the duration of the COVID-19 declaration under Section 564(b)(1) of  the Act, 21 U.S.C. section 360bbb-3(b)(1), unless the authorization is terminated or revoked.  Performed at [facility], [place] [address] [place], [place] [address]   Urine Culture     Status: None   Collection Time: 03/29/21 12:35 AM   Specimen: In/Out Cath Urine  Result Value Ref Range Status   Specimen Description IN/OUT CATH URINE  Final   Special Requests NONE  Final   Culture   Final    NO GROWTH Performed at [facility], [place] [address], [place], [place] [address]    Report Status 03/30/2021 FINAL  Final  Respiratory (~20 pathogens) panel by PCR     Status: None   Collection Time: 03/29/21  1:52 PM   Specimen: Nasopharyngeal Swab; Respiratory  Result Value Ref Range Status   Adenovirus NOT DETECTED NOT DETECTED Final   Coronavirus 229E NOT DETECTED NOT DETECTED Final    Comment: (NOTE) The Coronavirus on the Respiratory Panel, DOES NOT test for the novel  Coronavirus (2019 nCoV)    Coronavirus HKU1 NOT  DETECTED NOT DETECTED Final   Coronavirus NL63 NOT DETECTED NOT DETECTED Final   Coronavirus OC43 NOT DETECTED NOT DETECTED Final   Metapneumovirus NOT DETECTED NOT DETECTED Final   Rhinovirus / Enterovirus NOT DETECTED NOT DETECTED Final   Influenza A NOT DETECTED NOT DETECTED Final   Influenza B NOT DETECTED NOT DETECTED Final   Parainfluenza Virus 1 NOT DETECTED NOT DETECTED Final   Parainfluenza Virus 2 NOT DETECTED NOT DETECTED Final   Parainfluenza Virus 3 NOT DETECTED NOT DETECTED Final   Parainfluenza Virus 4 NOT DETECTED NOT DETECTED Final   Respiratory Syncytial Virus NOT DETECTED NOT DETECTED Final   Bordetella pertussis NOT DETECTED NOT DETECTED Final   Bordetella Parapertussis NOT DETECTED NOT DETECTED Final   Chlamydophila pneumoniae NOT DETECTED NOT DETECTED Final   Mycoplasma pneumoniae NOT DETECTED NOT DETECTED Final    Comment: Performed at [facility], [place] [address], [place], [place] [address]    Labs: CBC: Recent Labs  Lab 03/28/21 2038 03/30/21 0141  WBC 12.5* 5.5  NEUTROABS 11.9*  --   HGB 12.3 10.4*  HCT 37.4 31.2*  MCV 84.8 83.9  PLT 230 286   Basic Metabolic Panel: Recent Labs  Lab 03/28/21 2038 03/30/21 0141  NA 138 140  K 3.6 3.7  CL 103 109  CO2 23 22  GLUCOSE 235* 112*  BUN 13 12  CREATININE 1.18* 0.91  CALCIUM 8.9 8.1*   Liver Function Tests: Recent Labs  Lab 03/28/21 2038  AST 48*  ALT 36  ALKPHOS 69  BILITOT 0.4  PROT 7.3  ALBUMIN 3.7   CBG: Recent Labs  Lab 03/30/21 1242 03/30/21 1744 03/30/21 2151 03/31/21 0849 03/31/21 1153  GLUCAP 126* 141* 251* 221* 124*    Discharge time spent: greater than 30 minutes.  Signed: Geradine Girt, DO [facility] 03/31/2021

## 2021-04-02 LAB — CULTURE, BLOOD (ROUTINE X 2)
Culture: NO GROWTH
Culture: NO GROWTH
Special Requests: ADEQUATE
Special Requests: ADEQUATE

## 2021-04-11 ENCOUNTER — Ambulatory Visit: Payer: Medicare PPO | Admitting: Physical Therapy

## 2021-05-15 ENCOUNTER — Encounter: Payer: Self-pay | Admitting: *Deleted

## 2021-05-15 NOTE — Progress Notes (Unsigned)
[employer] 90 - 05/15/21 1500   ? ?  ? Assessment   ? Assessment type Phone to patient   ? Is patient still in hospital? No   ? Date of hospital discharge after thrombolysis? 02/10/21   ?  ? Final 90-Day Modified Rankin Score  ? Final 90-Day Modified Rankin Score: (Select One) 1-Some symptoms from stroke remain, but able to carry out all usual activities   ?  ? EQ-5D-5L  ? Mobility 2- slight problems in walking about   ? Self-care 1- no problems with Self-care   ? Usual activities 2- slight problems with performing usual activities   ? Pain/discomfort 1- no pain or discomfort   ? Anxiety/Depression 3- moderate anxious or depressed   ? What number between 0-100 best describes the patient?s health state today (100 means the best health; 0 means the worst health)? 85   ?  ? Hospital Admission  ? In the Past 3 months (since your initial hospitalisation for stroke), have you been admitted to hospital (including day-only procedures) for any reason? Yes   ?  ? a. Hospital Admission   ? a. [facility]   ? a. Condition or purpose Nausea   ? a. Total days in hospital 5   ? a. Admission start date 03/28/21   ?  ? b. Hospital Admission   ? b. Hospital  [facility]   ? b. Condition or purpose pneumonia   ? b. Total days in hospital 5   ? b. Admission start date 04/06/21   ?  ? Doctor consultations  ? In the past 3 months (since your initial hospitalisation for stroke), have you seen any doctors or other health professional (for example physiotherapy, outpatient nurse, general practitioner) for any reason? Yes   ?  ? a. Doctor consultations  ? a. Type of service Family Med   ? a. Condition or purpose S/P CVA   ? a. Date of appointment 02/24/21   ?  ? b. Doctor consultations  ? b. Type of service Rehab   ? b. Condition or purpose left hemiparesis   ? b. Date of appointment 03/21/21   ?  ? c. Doctor consultations  ? c. Type of service Neurology   ? c. Condition or purpose S/P CVA   ? c. Date of appointment 03/21/21   ?  ? d.  Doctor consultation  ? d. Type of service Familiy Med   ? d. Condition or purpose Nausea   ? d. Date of appointment 04/10/21   ?  ? e. Doctor consultation  ? e. Type of service Cardiology   ? e. Condition or purpose chest pain   ? e. Date of appointment 04/21/21   ?  ? f. Doctor consultation  ? f. Type of service Neurology   ? f. Condition or purpose S/P CVA   ? f. Date of appointment 04/24/21   ? ?  ?  ? ?  ?  ?

## 2021-05-23 ENCOUNTER — Other Ambulatory Visit: Payer: Self-pay

## 2021-05-23 NOTE — Patient Outreach (Signed)
[facility] Customer service manager [facility]) Care Management ? ?05/23/2021 ? ?Lakira Ogando ?Jun 29, 1950 ?[id number] ? ? ?No telephone outreach to patient to obtain mRS. Was successfully completed by Darcel Bayley on 05/15/21. MRS= 1 ? ?Vanice Sarah ?[facility] Care Management Assistant ?[phone] ? ? ?

## 2021-05-29 ENCOUNTER — Other Ambulatory Visit: Payer: Self-pay

## 2021-05-29 ENCOUNTER — Ambulatory Visit: Payer: Medicare PPO | Attending: Physical Medicine and Rehabilitation | Admitting: Physical Therapy

## 2021-05-29 ENCOUNTER — Encounter: Payer: Self-pay | Admitting: Physical Therapy

## 2021-05-29 DIAGNOSIS — G8194 Hemiplegia, unspecified affecting left nondominant side: Secondary | ICD-10-CM | POA: Insufficient documentation

## 2021-05-29 DIAGNOSIS — R262 Difficulty in walking, not elsewhere classified: Secondary | ICD-10-CM | POA: Diagnosis present

## 2021-05-29 DIAGNOSIS — M6281 Muscle weakness (generalized): Secondary | ICD-10-CM

## 2021-05-29 DIAGNOSIS — M5459 Other low back pain: Secondary | ICD-10-CM

## 2021-05-29 DIAGNOSIS — M62830 Muscle spasm of back: Secondary | ICD-10-CM

## 2021-05-29 DIAGNOSIS — I69354 Hemiplegia and hemiparesis following cerebral infarction affecting left non-dominant side: Secondary | ICD-10-CM

## 2021-05-29 DIAGNOSIS — I639 Cerebral infarction, unspecified: Secondary | ICD-10-CM | POA: Diagnosis not present

## 2021-05-29 NOTE — Therapy (Signed)
[place] ?[facility] ?[address]  [address] ?[place], [place], [address] ?Phone: [phone]   Fax:  [phone] ? ?Physical Therapy Evaluation ? ?Patient Details  ?Name: Lindsay Rice ?MRN: [id number] ?Date of Birth: 10/02/1950 ?Referring Provider (PT): Genice Rouge, MD ? ? ?Encounter Date: 05/29/2021 ? ? PT End of Session - 05/29/21 1007   ? ? Visit Number 1   ? Number of Visits 13   ? Date for PT Re-Evaluation 07/10/21   ? Authorization Type [employer] Medicare (prior auth) & Tricare   ? PT Start Time 1007   ? PT Stop Time 1101   ? PT Time Calculation (min) 54 min   ? Activity Tolerance Patient tolerated treatment well   ? Behavior During Therapy [facility] for tasks assessed/performed   ? ?  ?  ? ?  ? ? ?Past Medical History:  ?Diagnosis Date  ? Diabetes mellitus without complication (HCC)   ? Hypertension   ? Migraine headache   ? Mitral valve prolapse   ? Obesity (BMI 30.0-34.9)   ? Sleep apnea   ? ? ?History reviewed. No pertinent surgical history. ? ?There were no vitals filed for this visit. ? ? ? Subjective Assessment - 05/29/21 1011   ? ? Subjective Pt reports she had a stroke on 02/07/21 and was discharged from the hospital in early January. Has been back to the hospital 2x since, once for vomiting, high fever and LOC and once for PNA. Had some HH PT and OT. Still notes limited strength with carrying things in her L hand. When walking any distance, she notes muscle spasms in her L low back. She notes sometimes she feels dizzy with rolling over in bed, esp to R side, and sometime when getting up out of a chair. Has a RW, rollator and SPC but not routinely using anything at this point. Pt reports she has fallen out of the bed 2x since she was in the hospital - bruising her tailbone and irritating her R shoulder (h/o prior RCR x2 with repeat tearing of RTC following both surgeries during PT).   ? 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  Limitations Standing;Walking;Lifting;House hold activities   ? How long can you stand comfortably? 30 minutes   ? How long can you walk comfortably? 1/4 mile   ? Diagnostic tests 02/08/22 - Small, ill-defined subacute infarct along the para thalamic region on the right.   ? Patient Stated Goals "To strengthen my L leg a little bit more"   ? Currently in Pain? No/denies   ? Pain Score 0-No pain   10/10 with muscle spasms while walking her dog  ? Pain Location Back   ? Pain Orientation Left   ? Pain Descriptors / Indicators Spasm   ? Pain Type Acute pain   ? Pain Radiating Towards n/a   ? Pain Onset More than a month ago   since CVA  ? Pain Frequency Intermittent   ? Aggravating Factors  walking longer distances, prolonged standing   ? Pain Relieving Factors sit down & rest; muscle relaxants used rarely   ? Effect of Pain on Daily Activities limits standing and walking tolerance   ? ?  ?  ? ?  ? ? ? ? ? [facility] PT Assessment - 05/29/21 1007   ? ?  ? Assessment  ? 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 Referring  Provider (PT) Genice RougeMegan Lovorn, MD   ? Onset Date/Surgical Date 02/07/21   ? Hand Dominance Right   ? Next MD Visit 06/09/21   ? Prior Therapy HH PT & OT; h/o OP PT s/p R RCR   ?  ? Precautions  ? Precautions Fall   ?  ? Restrictions  ? Weight Bearing Restrictions No   ?  ? Balance Screen  ? Has the patient fallen in the past 6 months Yes   ? How many times? 2   ? Has the patient had a decrease in activity level because of a fear of falling?  Yes   ? Is the patient reluctant to leave their home because of a fear of falling?  No   ?  ? Home Environment  ? Living Environment Private residence   ? Living Arrangements Spouse/significant other   & dog  ? Available Help at Discharge Family   ? Type of Home House   ? Home Access Stairs to enter   ? Entrance Stairs-Number of Steps 2   ? Entrance Stairs-Rails Right;Left   ? Home Layout One level;Laundry or work area in basement;Able to live on main  level with bedroom/bathroom   family has told her not to go down the stairs - family helps with laundry  ? Home Equipment Walker - 2 wheels;Walker - 4 wheels;Cane - single point;Shower seat;Grab bars - tub/shower;Wheelchair - manual   walk-in shower  ?  ? Prior Function  ? Level of Independence Independent   ? Vocation Retired   ? Leisure puzzles on her phone, circle puzzles, walking the dog 1/4 mile 4-5x/day   ?  ? Cognition  ? Overall Cognitive Status Within Functional Limits for tasks assessed   ?  ? Observation/Other Assessments  ? Focus on Therapeutic Outcomes (FOTO)  Neuromuscular disorder = 70 (risk adjusted = 48); predicted = 65   ?  ? ROM / Strength  ? AROM / PROM / Strength Strength   ?  ? Strength  ? Strength Assessment Site Hip;Knee;Ankle   ? Right/Left Hip Right;Left   ? Right Hip Flexion 4/5   ? Right Hip Extension 3+/5   ? Right Hip External Rotation  4-/5   ? Right Hip Internal Rotation 4/5   ? Right Hip ABduction 4-/5   ? Right Hip ADduction 4-/5   ? Left Hip Flexion 4-/5   ? Left Hip Extension 3+/5   ? Left Hip External Rotation 4-/5   ? Left Hip Internal Rotation 4-/5   ? Left Hip ABduction 3+/5   ? Left Hip ADduction 3+/5   ? Right/Left Knee Right;Left   ? Right Knee Flexion 4+/5   ? Right Knee Extension 4+/5   ? Left Knee Flexion 4/5   ? Left Knee Extension 4/5   ? Right/Left Ankle Right;Left   ? Right Ankle Dorsiflexion 4+/5   ? Right Ankle Plantar Flexion 4+/5   15 SLS heel raises  ? Left Ankle Dorsiflexion 4/5   ? Left Ankle Plantar Flexion 4/5   10 SLS heel raises  ?  ? Palpation  ? Palpation comment increased muscle tension & TTP in L lumbar paraspinals and glutes   ?  ? Ambulation/Gait  ? Ambulation/Gait Yes   ? Ambulation/Gait Assistance 5: Supervision;7: Independent   ? Assistive device None   ? Gait Pattern Within Functional Limits   ? Ambulation Surface Level;Indoor   ? Gait velocity 3.2 ft/sec   ?  ? Standardized Balance  Assessment  ? Standardized Balance Assessment Five Times Sit to  Stand;10 meter walk test;Timed Up and Go Test   ? Five times sit to stand comments  15.59 sec   ? 10 Meter Walk 10.25 sec   ?  ? Timed Up and Go Test  ? Normal TUG (seconds) 11.66   ? ?  ?  ? ?  ? ? ? ? ? ? ? ? ? ? ? ? ? ?Objective measurements completed on examination: See above findings.  ? ? ? ? ? ? ? ? ? ? ? ? ? ? ? ? PT Short Term Goals - 05/29/21 1101   ? ?  ? PT SHORT TERM GOAL #1  ? Title Patient will be independent in initial HEP to improve strength/mobility for better functional independence with ADLs   ? Status New   ? Target Date 06/19/21   ? ?  ?  ? ?  ? ? ? ? PT Long Term Goals - 05/29/21 1101   ? ?  ? PT LONG TERM GOAL #1  ? Title Patient will demonstrate independent use of ongoing/advanced HEP to facilitate ability to maintain/progress functional gains from skilled physical therapy services   ? Status New   ? Target Date 07/10/21   ?  ? PT LONG TERM GOAL #2  ? Title Patient will demonstrate improved B LE strength to >/= 4+/5 for improved stability and ease of mobility   ? Status New   ? Target Date 07/10/21   ?  ? PT LONG TERM GOAL #3  ? Title Patient to report reduction in frequency and intensity of LBP by >/= 50% to allow for improved activity and walking tolerance   ? Status New   ? Target Date 07/10/21   ?  ? PT LONG TERM GOAL #4  ? Title Patient will improve 5x STS time to </= 12.6 seconds to demonstrated improved functional strength and transfer efficiency   ? Status New   ? Target Date 07/10/21   ?  ? PT LONG TERM GOAL #5  ? Title Patient to report ability to perform ADLs, household tasks and leisure activities including walking her dog without limitation due to pain, weakness or lack of endurance   ? Status New   ? Target Date 07/10/21   ?  ? PT LONG TERM GOAL #6  ? Title Patient will improve FOTO to >/= 65 to demonstrate improving function   ? Status New   ? Target Date 07/10/21   ? ?  ?  ? ?  ? ? ? ? ? ? ? ? ? Plan - 05/29/21 1101   ? ? Clinical Impression Statement Iyana is a 71 y/o female  who presents to OP PT for L hemiparesis s/p R basal ganglia CVA on 02/07/21. She has been hospitalized 2x since her CVA for sepsis of unknown origin and PNA. She notes ongoing weakness on her L side which

## 2021-05-31 ENCOUNTER — Encounter: Payer: Self-pay | Admitting: Physical Therapy

## 2021-05-31 ENCOUNTER — Ambulatory Visit: Payer: Medicare PPO | Admitting: Physical Therapy

## 2021-05-31 DIAGNOSIS — I69354 Hemiplegia and hemiparesis following cerebral infarction affecting left non-dominant side: Secondary | ICD-10-CM | POA: Diagnosis not present

## 2021-05-31 DIAGNOSIS — M6281 Muscle weakness (generalized): Secondary | ICD-10-CM

## 2021-05-31 DIAGNOSIS — M5459 Other low back pain: Secondary | ICD-10-CM

## 2021-05-31 DIAGNOSIS — M62830 Muscle spasm of back: Secondary | ICD-10-CM

## 2021-05-31 DIAGNOSIS — R262 Difficulty in walking, not elsewhere classified: Secondary | ICD-10-CM

## 2021-05-31 NOTE — Therapy (Signed)
[place] ?[facility] ?[address]  [address] ?[place], [place], [address] ?Phone: [phone]   Fax:  [phone] ? ?Physical Therapy Treatment ? ?Patient Details  ?Name: Lindsay Rice ?MRN: [id number] ?Date of Birth: 06/12/1950 ?Referring Provider (PT): Genice RougeMegan Lovorn, MD ? ? ?Encounter Date: 05/31/2021 ? ? PT End of Session - 05/31/21 1020   ? ? Visit Number 2   ? Number of Visits 13   ? Date for PT Re-Evaluation 07/10/21   ? Authorization Type [employer] Medicare (prior auth) & T[employer]are   ? Authorization Time Period [employer] Medicare: 05/30/20 - 07/10/21   ? Authorization - Visit Number 1   ? Authorization - Number of Visits 12   ? PT Start Time 1020   ? PT Stop Time 1110   ? PT Time Calculation (min) 50 min   ? Activity Tolerance Patient tolerated treatment well   ? Behavior [facility] for tasks assessed/performed   ? ?  ?  ? ?  ? ? ?Past Medical History:  ?Diagnosis Date  ? Diabetes mellitus without complication (HCC)   ? Hypertension   ? Migraine headache   ? Mitral valve prolapse   ? Obesity (BMI 30.0-34.9)   ? Sleep apnea   ? ? ?History reviewed. No pertinent surgical history. ? ?There were no vitals filed for this visit. ? ? Subjective Assessment - 05/31/21 1024   ? ? Subjective Pt reporting increased pain in her low back and R shoulder today - may try to see if Dr. Earma ReadingBashore can give her another injection for her shoulder.   ? 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 Diagnostic tests 02/08/22 - Small, ill-defined subacute infarct along the para thalamic region on the right.   ? Patient Stated Goals "To strengthen my L leg a little bit more"   ? Currently in Pain? Yes   ? Pain Score 8    ? Pain Location Back   ? Pain Orientation Lower   ? Pain Descriptors / Indicators Other (Comment)   "aggravating"  ? Pain Type Acute pain   ? Pain Score 10   ? Pain Location Shoulder   ? Pain Orientation Right;Anterior;Upper;Posterior   ? Pain  Descriptors / Indicators Constant;Sharp   ? Pain Type Chronic pain   ? Pain Frequency Constant   ? ?  ?  ? ?  ? ? ? ? ? [facility] PT Assessment - 05/31/21 1020   ? ?  ? ROM / Strength  ? AROM / PROM / Strength AROM   ?  ? AROM  ? AROM Assessment Site Lumbar   ? Lumbar Flexion WFL - fingertips to floor   ? Lumbar Extension WFL   ? Lumbar - Right Side Bend hand to upper calf   ? Lumbar - Left Side Bend hand to upper calf   ? Lumbar - Right Rotation WFL   ? Lumbar - Left Rotation WFL   ?  ? Flexibility  ? Soft Tissue Assessment /Muscle Length yes   ? Hamstrings WFL   ? ITB mild tight B   ? Piriformis mild tight B   ? ?  ?  ? ?  ? ? ? ? ? ? ? ? ? ? ? ? ? ? ? ? [facility] Adult PT Treatment/Exercise - 05/31/21 1020   ? ?  ? Lumbar Exercises: Stretches  ? Single Knee to Chest Stretch Right;1 rep;30 seconds   ?  Single Knee to Chest Stretch Limitations pt not feeling a stretch   ? Lower Trunk Rotation 5 reps;10 seconds   ? Lower Trunk Rotation Limitations LTR/windshield wipers   pt not feeling a stretch  ? ITB Stretch Right;2 reps;30 seconds   ? ITB Stretch Limitations supine crossbody with strap & standing lateral lean with or w/o UE as tolerated by R shoulder   ? Piriformis Stretch Right;2 reps;30 seconds   ? Piriformis Stretch Limitations hooklying KTOS   pt not feeling a stretch  ? Figure 4 Stretch 2 reps;30 seconds;Supine;With overpressure   ? Figure 4 Stretch Limitations single leg figure-4 + overpressure and figure-4 to chest   pt not feeling a stretch  ?  ? Lumbar Exercises: Aerobic  ? Nustep L4 x 6 min (UE/LE)   ?  ? Lumbar Exercises: Supine  ? Pelvic Tilt 10 reps;5 seconds   ? Pelvic Tilt Limitations PPT   ? Clam 10 reps;3 seconds   ? Clam Limitations TrA/PPT + alt red TB bent knee fallout   ? Bent Knee Raise 10 reps;3 seconds   ? Bent Knee Raise Limitations TrA/PPT + red TB march   ?  ? Knee/Hip Exercises: Standing  ? Hip Abduction Right;Left;10 reps;Stengthening;Knee straight   ? Abduction Limitations looped red TB at  ankles   ? Hip Extension Right;Left;10 reps;Stengthening;Knee straight   ? Extension Limitations looped red TB at ankles   ? ?  ?  ? ?  ? ? ? ? ? ? ? ? ? ? PT Education - 05/31/21 1102   ? ? Education Details Initial HEP and self-STM for glutes - Access Code: [email or web address]   ? Person(s) Educated Patient   ? Methods Explanation;Demonstration;Verbal cues;Handout   ? Comprehension Verbalized understanding;Verbal cues required;Returned demonstration;Need further instruction   ? ?  ?  ? ?  ? ? ? PT Short Term Goals - 05/31/21 1026   ? ?  ? PT SHORT TERM GOAL #1  ? Title Patient will be independent in initial HEP to improve strength/mobility for better functional independence with ADLs   ? Status On-going   ? Target Date 06/19/21   ? ?  ?  ? ?  ? ? ? ? PT Long Term Goals - 05/31/21 1026   ? ?  ? PT LONG TERM GOAL #1  ? Title Patient will demonstrate independent use of ongoing/advanced HEP to facilitate ability to maintain/progress functional gains from skilled physical therapy services   ? Status On-going   ? Target Date 07/10/21   ?  ? PT LONG TERM GOAL #2  ? Title Patient will demonstrate improved B LE strength to >/= 4+/5 for improved stability and ease of mobility   ? Status On-going   ? Target Date 07/10/21   ?  ? PT LONG TERM GOAL #3  ? Title Patient to report reduction in frequency and intensity of LBP by >/= 50% to allow for improved activity and walking tolerance   ? Status On-going   ? Target Date 07/10/21   ?  ? PT LONG TERM GOAL #4  ? Title Patient will improve 5x STS time to </= 12.6 seconds to demonstrated improved functional strength and transfer efficiency   ? Status On-going   ? Target Date 07/10/21   ?  ? PT LONG TERM GOAL #5  ? Title Patient to report ability to perform ADLs, household tasks and leisure activities including walking her dog without limitation due to pain, weakness or lack of  endurance   ? Status On-going   ? Target Date 07/10/21   ?  ? PT LONG TERM GOAL #6  ? Title Patient will improve FOTO  to >/= 65 to demonstrate improving function   ? Status On-going   ? Target Date 07/10/21   ? ?  ?  ? ?  ? ? ? ? ? ? ? ? Plan - 05/31/21 1027   ? ? Clinical Impression Statement Pauletta reports increased LBP and R shoulder pain today. Assess lumbar ROM and proximal LE flexibility - ROM essentially WFL but mild proximal LE tightness along with increased muscle tension and TTP identified in R glutes. Initial HEP created targeting stretching to address tightness and abnormal muscle tension as well as lumbopelvic strengthening to improve lumbar stability and promote better core strength for improved balance and activity tolerance. Some difficulty experienced in identifying effective stretches and bridges not tolerated due to increased pain, but all exercises/stretches included in HEP well tolerated.   ? 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 Rehab Potential Good   ? PT Frequency 2x / week   ? PT Duration 6 weeks   ? PT Treatment/Interventions ADLs/Self Care Home Management;Cryotherapy;Electrical Stimulation;Moist Heat;DME Instruction;Gait training;Stair training;Functional mobility training;Therapeutic activities;Therapeutic exercise;Balance training;Neuromuscular re-education;Patient/family education;Manual techniques;Passive range of motion;Dry needling;Taping;Spinal Manipulations   ? PT Next Visit Plan Review initial HEP; progress lumbopelvic/LE flexibility and strengthening; MT and/or modalitites to address LBP and abnormal muscle tension in R glutes   ? PT Home Exercise Plan Access Code: [email or web address]    ? Consulted and Agree with Plan of Care Patient   ? ?  ?  ? ?  ? ? ?Patient will benefit from skilled therapeutic intervention in order to improve the following deficits and impairments:  Decreased activity tol

## 2021-05-31 NOTE — Patient Instructions (Signed)
? ? ?  Access Code: [email or web address] ?URL: [email or web address] ?Date: 05/31/2021 ?Prepared by: Glenetta Hew ? ?Exercises ?- Seated Flexion Stretch with Swiss Ball  - 2 x daily - 7 x weekly - 3 reps - 30 sec hold ?- Seated Thoracic Flexion and Rotation with Swiss Ball  - 2 x daily - 7 x weekly - 3 reps - 30 sec hold ?- Supine ITB Stretch with Strap  - 2 x daily - 7 x weekly - 3 reps - 30 sec hold ?- Standing ITB Stretch  - 2 x daily - 7 x weekly - 3 reps - 30 sec hold ?- Supine March with Resistance Band  - 1 x daily - 7 x weekly - 2 sets - 10 reps - 2-3 sec hold hold ?- Hooklying Isometric Clamshell  - 1 x daily - 7 x weekly - 2 sets - 10 reps - 3 sec hold ?- Hip Abduction with Resistance Loop  - 1 x daily - 7 x weekly - 2 sets - 10 reps - 3 sec hold ?- Hip Extension with Resistance Loop  - 1 x daily - 7 x weekly - 2 sets - 10 reps - 3 sec hold ?- Standing Glute Med Mobilization with Small Ball on Wall  - 1 x daily - 7 x weekly - 1-2 reps - 1-2 min hold ?

## 2021-06-05 ENCOUNTER — Ambulatory Visit: Payer: Medicare PPO | Admitting: Physical Therapy

## 2021-06-05 ENCOUNTER — Encounter: Payer: Self-pay | Admitting: Physical Therapy

## 2021-06-05 DIAGNOSIS — I69354 Hemiplegia and hemiparesis following cerebral infarction affecting left non-dominant side: Secondary | ICD-10-CM

## 2021-06-05 DIAGNOSIS — M5459 Other low back pain: Secondary | ICD-10-CM

## 2021-06-05 DIAGNOSIS — M62830 Muscle spasm of back: Secondary | ICD-10-CM

## 2021-06-05 DIAGNOSIS — M6281 Muscle weakness (generalized): Secondary | ICD-10-CM

## 2021-06-05 DIAGNOSIS — R262 Difficulty in walking, not elsewhere classified: Secondary | ICD-10-CM

## 2021-06-05 NOTE — Patient Instructions (Signed)
? ? ?  Access Code: [email or web address] ?URL: [email or web address] ?Date: 06/05/2021 ?Prepared by: Glenetta Hew ? ?Exercises ?- Seated Flexion Stretch with Swiss Ball  - 2 x daily - 7 x weekly - 3 reps - 30 sec hold ?- Seated Thoracic Flexion and Rotation with Swiss Ball  - 2 x daily - 7 x weekly - 3 reps - 30 sec hold ?- Supine ITB Stretch with Strap  - 2 x daily - 7 x weekly - 3 reps - 30 sec hold ?- Standing ITB Stretch  - 2 x daily - 7 x weekly - 3 reps - 30 sec hold ?- Supine March with Resistance Band  - 1 x daily - 7 x weekly - 2 sets - 10 reps - 2-3 sec hold hold ?- Hooklying Isometric Clamshell  - 1 x daily - 7 x weekly - 2 sets - 10 reps - 3 sec hold ?- Hip Abduction with Resistance Loop  - 1 x daily - 7 x weekly - 2 sets - 10 reps - 3 sec hold ?- Hip Extension with Resistance Loop  - 1 x daily - 7 x weekly - 2 sets - 10 reps - 3 sec hold ?- Standing Glute Med Mobilization with Small Ball on Wall  - 1 x daily - 7 x weekly - 1-2 reps - 1-2 min hold ?- Seated March with Resistance  - 1 x daily - 7 x weekly - 2 sets - 10 reps - 3 sec hold ?- Seated Isometric Hip Abduction with Resistance  - 1 x daily - 7 x weekly - 2 sets - 10 reps - 3 sec hold ?

## 2021-06-05 NOTE — Therapy (Signed)
[place] [facility] ?[address]  [address] ?[place], [place], [address] ?Phone: [phone]   Fax:  [phone] ? ?Physical Therapy Treatment ? ?Patient Details  ?Name: Lindsay Rice ?MRN: [id number] ?Date of Birth: 11/03/1950 ?Referring Provider (PT): Genice Rouge, MD ? ? ?Encounter Date: 06/05/2021 ? ? PT End of Session - 06/05/21 1017   ? ? Visit Number 3   ? Number of Visits 13   ? Date for PT Re-Evaluation 07/10/21   ? Authorization Type [employer]   ? Authorization Time Period [employer]: 05/30/20 - 07/10/21   ? Authorization - Visit Number 2   ? Authorization - Number of Visits 12   ? PT Start Time 1017   ? PT Stop Time 1100   ? PT Time Calculation (min) 43 min   ? Activity Tolerance Patient tolerated treatment well   ? Behavior During Therapy [facility] for tasks assessed/performed   ? ?  ?  ? ?  ? ? ?Past Medical History:  ?Diagnosis Date  ? Diabetes mellitus without complication (HCC)   ? Hypertension   ? Migraine headache   ? Mitral valve prolapse   ? Obesity (BMI 30.0-34.9)   ? Sleep apnea   ? ? ?History reviewed. No pertinent surgical history. ? ?There were no vitals filed for this visit. ? ? Subjective Assessment - 06/05/21 1020   ? ? Subjective Pt reports she fell into the storm door on Sat - not sure what happened (maybe knee gave way?). She notes fell into her L side hitting her shoulder on the door - now L shoulder hurting in addition to the R as well as R knee. Pt reports her cardioogist identified some arrythmias for which she will need a loop recorder implanted - currently scheduled for 07/27/21.   ? 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 Diagnostic tests 02/08/22 - Small, ill-defined subacute infarct along the para thalamic region on the right.   ? Patient Stated Goals "To strengthen my L leg a little bit more"   ? Currently in Pain? Yes   ? Pain Score 8    ? Pain Location Back   ? Pain Orientation Lower    ? Pain Descriptors / Indicators Cramping   ? Pain Score 10   ? Pain Location Shoulder   ? Pain Orientation Right;Left   ? Pain Type Acute pain;Chronic pain   ? Pain Radiating Towards L arm since fall   ? Pain Onset In the past 7 days   L shoulder and arm  ? Pain Frequency Constant   ? Pain Score 10   ? Pain Location Knee   ? Pain Orientation Right   ? Pain Type Acute pain   ? Pain Onset In the past 7 days   since fall on Sat  ? Pain Frequency Constant   ? ?  ?  ? ?  ? ? ? ? ? ? ? ? ? ? ? ? ? ? ? ? ? ? ? ? [facility] Adult PT Treatment/Exercise - 06/05/21 1017   ? ?  ? Lumbar Exercises: Stretches  ? ITB Stretch Right;Left;3 reps;30 seconds   ? Other Lumbar Stretch Exercise seated 3-way lumbar flexion stretch with SPC support 10 x 5" and 1 x 30" each position   ? Other Lumbar Stretch Exercise seated R/L side bending QL stretch 2 x 30 sec   ?  ? Lumbar  Exercises: Aerobic  ? Nustep L4 x 6 min (UE/LE)   ?  ? Lumbar Exercises: Supine  ? Clam 10 reps;3 seconds   ? Clam Limitations TrA/PPT + alt red TB bent knee fallout   ? Bent Knee Raise 10 reps;3 seconds   ? Bent Knee Raise Limitations TrA/PPT + red TB march   ?  ? Knee/Hip Exercises: Seated  ? Clamshell with TheraBand Red   10 x 3"  ? Marching Both;10 reps   ? Marching Limitations looped red TB above knees   ? ?  ?  ? ?  ? ? ? ? ? ? ? ? ? ? PT Education - 06/05/21 1100   ? ? Education Details HEP modification   ? Person(s) Educated Patient   ? Methods Explanation;Demonstration;Verbal cues;Handout   ? Comprehension Verbalized understanding;Verbal cues required;Returned demonstration;Need further instruction   ? ?  ?  ? ?  ? ? ? PT Short Term Goals - 05/31/21 1026   ? ?  ? PT SHORT TERM GOAL #1  ? Title Patient will be independent in initial HEP to improve strength/mobility for better functional independence with ADLs   ? Status On-going   ? Target Date 06/19/21   ? ?  ?  ? ?  ? ? ? ? PT Long Term Goals - 05/31/21 1026   ? ?  ? PT LONG TERM GOAL #1  ? Title Patient will  demonstrate independent use of ongoing/advanced HEP to facilitate ability to maintain/progress functional gains from skilled physical therapy services   ? Status On-going   ? Target Date 07/10/21   ?  ? PT LONG TERM GOAL #2  ? Title Patient will demonstrate improved B LE strength to >/= 4+/5 for improved stability and ease of mobility   ? Status On-going   ? Target Date 07/10/21   ?  ? PT LONG TERM GOAL #3  ? Title Patient to report reduction in frequency and intensity of LBP by >/= 50% to allow for improved activity and walking tolerance   ? Status On-going   ? Target Date 07/10/21   ?  ? PT LONG TERM GOAL #4  ? Title Patient will improve 5x STS time to </= 12.6 seconds to demonstrated improved functional strength and transfer efficiency   ? Status On-going   ? Target Date 07/10/21   ?  ? PT LONG TERM GOAL #5  ? Title Patient to report ability to perform ADLs, household tasks and leisure activities including walking her dog without limitation due to pain, weakness or lack of endurance   ? Status On-going   ? Target Date 07/10/21   ?  ? PT LONG TERM GOAL #6  ? Title Patient will improve FOTO to >/= 65 to demonstrate improving function   ? Status On-going   ? Target Date 07/10/21   ? ?  ?  ? ?  ? ? ? ? ? ? ? ? Plan - 06/05/21 1100   ? ? Clinical Impression Statement Charna reports a fall on Saturday but unsure of the cause - R knee may have given way but not sure and she does note increasing dizziness of late but unsure if she felt dizzy at the time. She notes her cardiologist plans to implant a loop recorder on 07/27/21 due to arrhythmias identified during recent cardiac monitoring. Increased pain in R knee, B shoulders and low back/L flank since the fall has been limiting her exercise tolerance, esp with band resisted  exercises. Reviewed HEP today and tried alternative versions of strengthening exercises in sitting with better tolerance reported - HEP updated accordingly.   ? 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 Rehab Potential Good   ? PT Frequency 2x / week   ? PT Duration 6 weeks   ? PT Treatment/Interventions ADLs/Self Care Home Management;Cryotherapy;Electrical Stimulation;Moist Heat;DME Instruction;Gait training;Stair training;Functional mobility training;Therapeutic activities;Therapeutic exercise;Balance training;Neuromuscular re-education;Patient/family education;Manual techniques;Passive range of motion;Dry needling;Taping;Spinal Manipulations   ? PT Next Visit Plan progress lumbopelvic/LE flexibility and strengthening; MT and/or modalitites to address LBP and abnormal muscle tension in R glutes   ? PT Home Exercise Plan Access Code: [email or web address]   ? Consulted and Agree with Plan of Care Patient   ? ?  ?  ? ?  ? ? ?Patient will benefit from skilled therapeutic intervention in order to improve the following deficits and impairments:  Decreased activity tolerance, Decreased balance, Decreased endurance, Decreased knowledge of precautions, Decreased mobility, Decreased strength, Difficulty walking, Increased fascial restricitons, Increased muscle spasms, Impaired perceived functional ability, Impaired flexibility, Impaired UE functional use, Postural dysfunction, Improper body mechanics, Pain ? ?Visit Diagnosis: ?Hemiplegia and hemiparesis following cerebral infarction affecting left non-dominant side (HCC) ? ?Other low back pain ? ?Muscle weakness (generalized) ? ?Muscle spasm of back ? ?Difficulty in walking, not elsewhere classified ? ? ? ? ?Problem List ?Patient Active Problem List  ? Diagnosis Date  Noted  ? Sepsis with acute organ dysfunction without septic shock (HCC) 03/29/2021  ? Fever, unknown origin 03/29/2021  ? Fever 03/29/2021  ? Essential hypertension 03/10/2021  ? Diabetes mellitus (HCC)

## 2021-06-07 ENCOUNTER — Ambulatory Visit: Payer: Medicare PPO

## 2021-06-07 DIAGNOSIS — M62830 Muscle spasm of back: Secondary | ICD-10-CM

## 2021-06-07 DIAGNOSIS — R262 Difficulty in walking, not elsewhere classified: Secondary | ICD-10-CM

## 2021-06-07 DIAGNOSIS — I69354 Hemiplegia and hemiparesis following cerebral infarction affecting left non-dominant side: Secondary | ICD-10-CM

## 2021-06-07 DIAGNOSIS — M5459 Other low back pain: Secondary | ICD-10-CM

## 2021-06-07 DIAGNOSIS — M6281 Muscle weakness (generalized): Secondary | ICD-10-CM

## 2021-06-07 NOTE — Therapy (Signed)
[place] [facility] ?[address] ?[place], [place], [address] ?Phone: [phone]   Fax:  [phone] ? ?Physical Therapy Treatment ? ?Patient Details  ?Name: Lindsay Rice ?MRN: [id number] ?Date of Birth: September 04, 1950 ?Referring Provider (PT): Courtney Heys, MD ? ? ?Encounter Date: 06/07/2021 ? ? PT End of Session - 06/07/21 1100   ? ? Visit Number 4   ? Number of Visits 13   ? Date for PT Re-Evaluation 07/10/21   ? Authorization Type [employer] & [employer]   ? Authorization Time Period [employer] Medicare: 05/30/20 - 07/10/21   ? Authorization - Visit Number 3   ? Authorization - Number of Visits 12   ? PT Start Time 1017   ? PT Stop Time 1058   ? PT Time Calculation (min) 41 min   ? Activity Tolerance Patient tolerated treatment well   ? Behavior During Therapy [facility] for tasks assessed/performed   ? ?  ?  ? ?  ? ? ?Past Medical History:  ?Diagnosis Date  ? Diabetes mellitus without complication ([place])   ? Hypertension   ? Migraine headache   ? Mitral valve prolapse   ? Obesity (BMI 30.0-34.9)   ? Sleep apnea   ? ? ?History reviewed. No pertinent surgical history. ? ?There were no vitals filed for this visit. ? ? Subjective Assessment - 06/07/21 1019   ? ? Subjective Pt notes pain today still from fall last week.   ? 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 Currently in Pain? Yes   ? Pain Score 8    ? Pain Location Knee   ? Pain Orientation Lower   ? Pain Descriptors / Indicators Aching   ? Pain Type Acute pain   ? Pain Score 5   ? Pain Location Back   ? Pain Orientation Lower   ? Pain Descriptors / Indicators Constant;Sharp   ? Pain Type Acute pain   ? Pain Score 5   ? Pain Location Shoulder   ? Pain Orientation Right;Left   ? Pain Descriptors / Indicators Aching   ? Pain Type Acute pain   ? ?  ?  ? ?  ? ? ? ? ? [facility] PT Assessment - 06/07/21 0001   ? ?  ? Strength  ? Right Hip Flexion 4+/5   ? Right Hip ABduction 4/5   ? Right Hip  ADduction 4/5   ? Left Hip Flexion 4-/5   ? Left Hip ABduction 4-/5   ? Left Hip ADduction 4-/5   ? Right Knee Flexion 4+/5   ? Right Knee Extension 4+/5   ? Left Knee Flexion 4/5   ? Left Knee Extension 4/5   ? ?  ?  ? ?  ? ? ? ? ? ? ? ? ? ? ? ? ? ? ? ? [facility] Adult PT Treatment/Exercise - 06/07/21 0001   ? ?  ? Lumbar Exercises: Aerobic  ? Nustep L4 x 6 min (UE/LE)   ?  ? Lumbar Exercises: Supine  ? Clam 10 reps;3 seconds   ? Clam Limitations TrA/PPT + alt red TB bent knee fallout   ? Bridge Compliant;10 reps;3 seconds   2 set  ? Bridge Limitations RTB   ? Isometric Hip Flexion Limitations 3x5" 90/90 with UE giving resistance   ?  ? Knee/Hip Exercises: Standing  ? Other Standing Knee Exercises church pew x 10 with one  UE support   ?  ? Knee/Hip Exercises: Seated  ? Clamshell with TheraBand Red   15x3"  ? Marching Strengthening;Both;2 sets;10 reps   ? Marching Limitations looped red TB above knees   ? ?  ?  ? ?  ? ? ? ? ? ? ? ? ? ? ? ? PT Short Term Goals - 06/07/21 1034   ? ?  ? PT SHORT TERM GOAL #1  ? Title Patient will be independent in initial HEP to improve strength/mobility for better functional independence with ADLs   ? Status Achieved   06/07/21  ? Target Date 06/19/21   ? ?  ?  ? ?  ? ? ? ? PT Long Term Goals - 06/07/21 1027   ? ?  ? PT LONG TERM GOAL #1  ? Title Patient will demonstrate independent use of ongoing/advanced HEP to facilitate ability to maintain/progress functional gains from skilled physical therapy services   ? Status On-going   ? Target Date 07/10/21   ?  ? PT LONG TERM GOAL #2  ? Title Patient will demonstrate improved B LE strength to >/= 4+/5 for improved stability and ease of mobility   ? Status On-going   ? Target Date 07/10/21   ?  ? PT LONG TERM GOAL #3  ? Title Patient to report reduction in frequency and intensity of LBP by >/= 50% to allow for improved activity and walking tolerance   ? Status Achieved   06/07/21- 50% improvement  ? Target Date 07/10/21   ?  ? PT LONG TERM GOAL #4   ? Title Patient will improve 5x STS time to </= 12.6 seconds to demonstrated improved functional strength and transfer efficiency   ? Status On-going   ? Target Date 07/10/21   ?  ? PT LONG TERM GOAL #5  ? Title Patient to report ability to perform ADLs, household tasks and leisure activities including walking her dog without limitation due to pain, weakness or lack of endurance   ? Status On-going   ? Target Date 07/10/21   ?  ? PT LONG TERM GOAL #6  ? Title Patient will improve FOTO to >/= 65 to demonstrate improving function   ? Status On-going   ? Target Date 07/10/21   ? ?  ?  ? ?  ? ? ? ? ? ? ? ? Plan - 06/07/21 1101   ? ? Clinical Impression Statement Pt notes being I with intial HEP now has met STG #1. MMT shows majority of muscle groups remaining at 4- or 4+/5. Pt showed a good tolerance for the interventions today, mild LBP noted with isometric hip flexion. She showed a better performance of the bridges today with no increased LBP. Although she still had pain from a fall over the weekend she notes overall 50% improvement in LBP, so also met LTG #3.   ? Personal Factors and Comorbidities Age;Comorbidity 3+;Fitness;Past/Current Experience;Time since onset of injury/illness/exacerbation   ? 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 PT Frequency 2x / week   ? PT Duration 6 weeks   ? PT Treatment/Interventions ADLs/Self Care  Home Management;Cryotherapy;Electrical Stimulation;Moist Heat;DME Instruction;Gait training;Stair training;Functional mobility training;Therapeutic activities;Therapeutic  exercise;Balance training;Neuromuscular re-education;Patient/family education;Manual techniques;Passive range of motion;Dry needling;Taping;Spinal Manipulations   ? PT Next Visit Plan progress lumbopelvic/LE flexibility and strengthening; MT and/or modalitites to address LBP and abnormal muscle tension in R glutes   ? PT Home Exercise Plan Access Code: [email or web address]   ? Consulted and Agree with Plan of Care Patient   ? ?  ?  ? ?  ? ? ?Patient will benefit from skilled therapeutic intervention in order to improve the following deficits and impairments:  Decreased activity tolerance, Decreased balance, Decreased endurance, Decreased knowledge of precautions, Decreased mobility, Decreased strength, Difficulty walking, Increased fascial restricitons, Increased muscle spasms, Impaired perceived functional ability, Impaired flexibility, Impaired UE functional use, Postural dysfunction, Improper body mechanics, Pain ? ?Visit Diagnosis: ?Hemiplegia and hemiparesis following cerebral infarction affecting left non-dominant side ([place]) ? ?Other low back pain ? ?Muscle weakness (generalized) ? ?Muscle spasm of back ? ?Difficulty in walking, not elsewhere classified ? ? ? ? ?Problem List ?Patient Active Problem List  ? Diagnosis Date Noted  ? Sepsis with acute organ dysfunction without septic shock ([place]) 03/29/2021  ? Fever, unknown origin 03/29/2021  ? Fever 03/29/2021  ? Essential hypertension 03/10/2021  ? Diabetes mellitus ([place]) 03/10/2021  ? Left hemiparesis ([place]) 03/10/2021  ? Infarction of right basal ganglia ([place]) 02/10/2021  ? Stroke determined by clinical assessment ([place]) 02/07/2021  ? Other obesity due to excess calories 08/09/2016  ? DDD (degenerative disc disease), cervical 10/28/2015  ? Chronic headache 05/05/2015  ? Acquired hypothyroidism 03/24/2015  ? ? ?Artist Pais, PTA ?06/07/2021, 11:54 AM ? ?[facility] [facility] ?[address] ?[place], [place],  [address] ?Phone: [phone]   Fax:  [phone] ? ?Name: Kerilyn Cortner ?MRN: [id number] ?Date of Birth: [birth year] ? ? ? ?

## 2021-06-09 ENCOUNTER — Encounter: Payer: Medicare PPO | Attending: Physical Medicine and Rehabilitation | Admitting: Physical Medicine and Rehabilitation

## 2021-06-09 ENCOUNTER — Encounter: Payer: Self-pay | Admitting: Physical Medicine and Rehabilitation

## 2021-06-09 VITALS — BP 137/79 | HR 91 | Ht 65.0 in | Wt 191.0 lb

## 2021-06-09 DIAGNOSIS — G8194 Hemiplegia, unspecified affecting left nondominant side: Secondary | ICD-10-CM | POA: Diagnosis present

## 2021-06-09 DIAGNOSIS — I639 Cerebral infarction, unspecified: Secondary | ICD-10-CM | POA: Diagnosis present

## 2021-06-09 NOTE — Progress Notes (Deleted)
? ?  Subjective:  ? ? Patient ID: Lindsay Rice, female    DOB: 03-16-1950, 71 y.o.   MRN: [id number] ? ?HPI ? ? ? ?Review of Systems ? ?   ?Objective:  ? Physical Exam ? ? ? ? ?   ?Assessment & Plan:  ? ? ?

## 2021-06-09 NOTE — Progress Notes (Signed)
? ?Subjective:  ? ? Patient ID: Lindsay Rice, female    DOB: Dec 12, 1950, 71 y.o.   MRN: [id number] ? ?HPI ?  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  ?Here for f/u on stroke.  ? ? ?Looks and feeling good ?Fell Saturday- fell against storm door that props open- ?Didn't go all the way down- landed on potting soil.  ?Had grocery bag- turned and and next thing she knew sitting on potting soil- husband didn't think was passed out- was doing well afterwards as well.  ? ?Walking street up and down - using cane- with dog- can walk around 1/2 mile 4x/day- walks the puppy.  ? ?Husband depressed- so doing walking to get out of house-  ?Legs hurts so much, cannot walk.  ?Husband is 71 yrs old.  ? ?Still on Zoloft for mood.  ?50 mg daily.  ? ?Does PT outpt 2x/week- ?Gets out of house.  ?Added some exercises that H/H wasn't doing.  ? ? ? ?Pain Inventory ?Average Pain 5 ?Pain Right Now 5 ?My pain is dull ? ?LOCATION OF PAIN  shoulder, knee, back ? ?BOWEL ?Number of stools per week: 5 ?Oral laxative use Yes  ?Type of laxative dulcolax ?Enema or suppository use No  ?History of colostomy No  ?Incontinent No  ? ?BLADDER ?Normal and Pads ?In and out cath, frequency na ?Able to self cath  na ?Bladder incontinence No  ?Frequent urination No  ?Leakage with coughing No  ?Difficulty starting stream No  ?Incomplete bladder emptying No  ? ? ?Mobility ?use a cane ?do you drive?  yes ? ?Function ?disabled: date disabled 05/03/2018 ? ?Neuro/Psych ?weakness ?spasms ?depression ? ?Prior Studies ?Any changes since last visit?  no ? ?Physicians involved in your care ?Any changes since last visit?  no ? ? ?No family history on file. ?Social History  ? ?Socioeconomic History  ? Marital status: Married  ?  Spouse  name: Not on file  ? Number of children: Not on file  ? Years of education: Not on file  ? Highest education level: Not on file  ?Occupational History  ? Not on file  ?Tobacco Use  ? Smoking status: Never  ? Smokeless tobacco: Never  ?Vaping Use  ? Vaping Use: Never used  ?Substance and Sexual Activity  ? Alcohol use: Not Currently  ? Drug use: Never  ? Sexual activity: Not Currently  ?Other Topics Concern  ? Not on file  ?Social History Narrative  ? Not on file  ? ?Social Determinants of Health  ? ?Financial Resource Strain: Not on file  ?Food Insecurity: Not on file  ?Transportation Needs: Not on file  ?Physical Activity: Not on file  ?Stress: Not on file  ?Social Connections: Not on file  ? ?No past surgical history on file. ?Past Medical History:  ?Diagnosis Date  ? Diabetes mellitus without complication (HCC)   ? Hypertension   ? Migraine headache   ? Mitral valve prolapse   ? Obesity (BMI 30.0-34.9)   ? Sleep apnea   ? ?BP 137/79   Pulse 91   Ht 5\' 5"  (1.651 m)   Wt 191 lb (86.6 kg)   SpO2 96%   BMI 31.78 kg/m?  ? ?Opioid Risk Score:   ?Fall Risk  Score:  `1 ? ?Depression screen PHQ 2/9 ? ? ?  03/10/2021  ?  1:07 PM  ?Depression screen PHQ 2/9  ?Decreased Interest 0  ?Down, Depressed, Hopeless 1  ?PHQ - 2 Score 1  ?Altered sleeping 1  ?Tired, decreased energy 1  ?Change in appetite 1  ?Feeling bad or failure about yourself  1  ?Trouble concentrating 1  ?Moving slowly or fidgety/restless 0  ?Suicidal thoughts 0  ?PHQ-9 Score 6  ?  ? ?Review of Systems  ?Musculoskeletal:  Positive for back pain.  ?     Shoulder and knee pain  ?All other systems reviewed and are negative. ? ?   ?Objective:  ? Physical Exam ?Awake, alert, appropriate, wearing eyeglasses;  ?Using cane to walk, NAD ? ?RUE 5/5 ?LUE 5/5 exept FA 4+/5 on L ?RLE- 5/5 ?LLE- HF/KE 4+/5 otherwise 5/5 ? ?Neuro: ?Intact to light touch in all 4 extremities ?No spasms/clonus/hoffman's  ?   ?Assessment & Plan:  ? ?  ?Pt is a 71 yr old female with hx of L  hemiparesis due to R basal ganglia infarct/posterior limb/R internal capsule-late 12/22- with hx of migraines and R RTC injury/tear; HTN; DM A1c 8.3,- down to 6.7 and hypothyroidism-  ?Here for f/u on stroke.  ?Stroke was 02/07/21 ? ? ?Suggest speaking with PCP about dose of Zoloft ?2. No spasticity- stroke 02/07/21- no signs of spasticity on exam ? ?3. Cal me if develops spasticity - doing great now, but has a small chance of developing- up to 6 months- if don't by then, will not; muscle spasms on L side. Don't wait for next appointment.  ? ?4. To get loop recorder next month.  ? ?5.  F/U on 3 months ? ?I spent a total of  20  minutes on total care today- >50% coordination of care- due to education on spasticity.  ? ?

## 2021-06-09 NOTE — Patient Instructions (Signed)
Pt is a 71 yr old female with hx of L hemiparesis due to R basal ganglia infarct/posterior limb/R internal capsule-late 12/22- with hx of migraines and R RTC injury/tear; HTN; DM A1c 8.3,- down to 6.7 and hypothyroidism-  ?Here for f/u on stroke.  ?Stroke was 02/07/21 ? ? ?Suggest speaking with PCP about dose of Zoloft ?2. No spasticity- stroke 02/07/21- no signs of spasticity on exam ? ?3. Cal me if develops spasticity - doing great now, but has a small chance of developing- up to 6 months- if don't by then, will not; muscle spasms on L side. Don't wait for next appointment.  ? ?4. To get loop recorder next month.  ? ?5.  F/U on 3 months ?

## 2021-06-12 ENCOUNTER — Encounter: Payer: Self-pay | Admitting: Physical Therapy

## 2021-06-12 ENCOUNTER — Ambulatory Visit: Payer: Medicare PPO | Admitting: Physical Therapy

## 2021-06-12 DIAGNOSIS — M6281 Muscle weakness (generalized): Secondary | ICD-10-CM

## 2021-06-12 DIAGNOSIS — M62830 Muscle spasm of back: Secondary | ICD-10-CM

## 2021-06-12 DIAGNOSIS — I69354 Hemiplegia and hemiparesis following cerebral infarction affecting left non-dominant side: Secondary | ICD-10-CM

## 2021-06-12 DIAGNOSIS — M5459 Other low back pain: Secondary | ICD-10-CM

## 2021-06-12 DIAGNOSIS — R262 Difficulty in walking, not elsewhere classified: Secondary | ICD-10-CM

## 2021-06-12 NOTE — Patient Instructions (Signed)
? ? ?  Access Code: [email or web address] ?URL: [email or web address] ?Date: 06/12/2021 ?Prepared by: Glenetta Hew ? ?Exercises ?- Seated Flexion Stretch with Swiss Ball  - 2 x daily - 7 x weekly - 3 reps - 30 sec hold ?- Seated Thoracic Flexion and Rotation with Swiss Ball  - 2 x daily - 7 x weekly - 3 reps - 30 sec hold ?- Supine ITB Stretch with Strap  - 2 x daily - 7 x weekly - 3 reps - 30 sec hold ?- Standing ITB Stretch  - 2 x daily - 7 x weekly - 3 reps - 30 sec hold ?- Supine March with Resistance Band  - 1 x daily - 3 x weekly - 2 sets - 10 reps - 2-3 sec hold hold ?- Hooklying Isometric Clamshell  - 1 x daily - 3 x weekly - 2 sets - 10 reps - 3 sec hold ?- Hip Abduction with Resistance Loop  - 1 x daily - 3 x weekly - 2 sets - 10 reps - 3 sec hold ?- Hip Extension with Resistance Loop  - 1 x daily - 3 x weekly - 2 sets - 10 reps - 3 sec hold ?- Standing Glute Med Mobilization with Small Ball on Wall  - 1 x daily - 7 x weekly - 1-2 reps - 1-2 min hold ?- Seated March with Resistance  - 1 x daily - 3 x weekly - 2 sets - 10 reps - 3 sec hold ?- Seated Isometric Hip Abduction with Resistance  - 1 x daily - 3 x weekly - 2 sets - 10 reps - 3 sec hold ?- Sternocleidomastoid Stretch  - 2 x daily - 7 x weekly - 3 reps - 30 sec hold ?- Standing Bilateral Low Shoulder Row with Anchored Resistance  - 1 x daily - 3 x weekly - 2 sets - 10 reps - 5 sec hold ?- Scapular Retraction with Resistance Advanced  - 1 x daily - 3 x weekly - 2 sets - 10 reps - 5 sec hold ?- Shoulder External Rotation and Scapular Retraction with Resistance  - 1 x daily - 3 x weekly - 2 sets - 10 reps - 3-5 sec hold ?

## 2021-06-12 NOTE — Therapy (Signed)
[place] ?[facility] ?[place] ?[place], [place], [address] ?Phone: [phone]   Fax:  [phone] ? ?Physical Therapy Treatment ? ?Patient Details  ?Name: Lindsay Rice ?MRN: [id number] ?Date of Birth: February 23, 1950 ?Referring Provider (PT): Courtney Heys, MD ? ? ?Encounter Date: 06/12/2021 ? ? PT End of Session - 06/12/21 1009   ? ? Visit Number 5   ? Number of Visits 13   ? Date for PT Re-Evaluation 07/10/21   ? Authorization Type [employer]   ? Authorization Time Period [employer]: 05/30/20 - 07/10/21   ? Authorization - Visit Number 4   ? Authorization - Number of Visits 12   ? PT Start Time 1009   ? PT Stop Time 1054   ? PT Time Calculation (min) 45 min   ? Activity Tolerance Patient tolerated treatment well   ? Behavior During Therapy [facility] for tasks assessed/performed   ? ?  ?  ? ?  ? ? ?Past Medical History:  ?Diagnosis Date  ? Diabetes mellitus without complication ([place])   ? Hypertension   ? Migraine headache   ? Mitral valve prolapse   ? Obesity (BMI 30.0-34.9)   ? Sleep apnea   ? ? ?History reviewed. No pertinent surgical history. ? ?There were no vitals filed for this visit. ? ? Subjective Assessment - 06/12/21 1012   ? ? Subjective Pt reports Dr. Dagoberto Ligas was pleased with her progress at her visit last week and states if she continues to improve at her current pace, she will likely be released by the MD as of her next visit in 3 months.   ? 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 Currently in Pain? Yes   ? Pain Score 7    ? Pain Location Knee   ? Pain Orientation Right   ? Pain Descriptors / Indicators Sharp;Constant   ? Pain Type Acute pain   ? Pain Score 9   8-9/10  ? Pain Location Shoulder   ? Pain Orientation Right   ? Pain Descriptors / Indicators Sharp   ? Pain Type Acute pain   ? ?  ?  ? ?  ? ? ? ? ? [facility] PT Assessment - 06/12/21 1009   ? ?  ? Assessment  ? Medical Diagnosis R basal ganglia  infarct with L hemiparesis   ? Referring Provider (PT) Courtney Heys, MD   ? Onset Date/Surgical Date 02/07/21   ? Next MD Visit 09/18/21   ? ?  ?  ? ?  ? ? ? ? ? ? ? ? ? ? ? ? ? ? ? ? [place] Adult PT Treatment/Exercise - 06/12/21 1009   ? ?  ? Neck Exercises: Seated  ? Neck Retraction 10 reps;3 secs   ?  ? Neck Exercises: Stretches  ? Other Neck Stretches R/L SCM stretch 2 x 30 sec   ?  ? Lumbar Exercises: Aerobic  ? Nustep L5 x 6 min (UE/LE)   ?  ? Knee/Hip Exercises: Standing  ? Heel Raises Both;20 reps;3 seconds   ? Heel Raises Limitations heel/toe raises at counter   ? Functional Squat 10 reps;3 seconds   ? Functional Squat Limitations glute touch to mat table + red TB hip ABD isometric   UE support on back to chair  ? Other Standing Knee Exercises church pews x 20   cues to avoid arching back  ?  Other Standing Knee Exercises R/L retro step with single UE raise (opp hand on counter) x 10   ?  ? Knee/Hip Exercises: Seated  ? Clamshell with TheraBand Red   15 x 3"  ? Marching Both;2 sets;10 reps;Strengthening   ? Marching Limitations looped red TB above knees   ? Sit to Sand 10 reps;without UE support   + red TB hip ABD isometric  ?  ? Shoulder Exercises: Seated  ? Retraction Both;10 reps;AROM;Strengthening   ? Retraction Limitations cues for scap retraction + depression, avoiding shoulder hike   ?  ? Shoulder Exercises: Standing  ? External Rotation Both;10 reps;Strengthening;Theraband   ? Theraband Level (Shoulder External Rotation) Level 2 (Red)   ? External Rotation Limitations + scap retraction; standing with back along doorframe   ? Row Both;10 reps;Strengthening;Theraband   ? Theraband Level (Shoulder Row) Level 2 (Red)   ? Row Limitations cues for scap retraction + depression, avoiding shoulder hike   ? Retraction Both;10 reps;Strengthening;Theraband   ? Theraband Level (Shoulder Retraction) Level 2 (Red)   ? Retraction Limitations + mini shoulder extension; cues for scap retraction + depression   ? ?  ?  ? ?   ? ? ? ? ? ? ? ? ? ? ? ? PT Short Term Goals - 06/07/21 1034   ? ?  ? PT SHORT TERM GOAL #1  ? Title Patient will be independent in initial HEP to improve strength/mobility for better functional independence with ADLs   ? Status Achieved   06/07/21  ? Target Date 06/19/21   ? ?  ?  ? ?  ? ? ? ? PT Long Term Goals - 06/07/21 1027   ? ?  ? PT LONG TERM GOAL #1  ? Title Patient will demonstrate independent use of ongoing/advanced HEP to facilitate ability to maintain/progress functional gains from skilled physical therapy services   ? Status On-going   ? Target Date 07/10/21   ?  ? PT LONG TERM GOAL #2  ? Title Patient will demonstrate improved B LE strength to >/= 4+/5 for improved stability and ease of mobility   ? Status On-going   ? Target Date 07/10/21   ?  ? PT LONG TERM GOAL #3  ? Title Patient to report reduction in frequency and intensity of LBP by >/= 50% to allow for improved activity and walking tolerance   ? Status Achieved   06/07/21- 50% improvement  ? Target Date 07/10/21   ?  ? PT LONG TERM GOAL #4  ? Title Patient will improve 5x STS time to </= 12.6 seconds to demonstrated improved functional strength and transfer efficiency   ? Status On-going   ? Target Date 07/10/21   ?  ? PT LONG TERM GOAL #5  ? Title Patient to report ability to perform ADLs, household tasks and leisure activities including walking her dog without limitation due to pain, weakness or lack of endurance   ? Status On-going   ? Target Date 07/10/21   ?  ? PT LONG TERM GOAL #6  ? Title Patient will improve FOTO to >/= 65 to demonstrate improving function   ? Status On-going   ? Target Date 07/10/21   ? ?  ?  ? ?  ? ? ? ? ? ? ? ? Plan - 06/12/21 1054   ? ? Clinical Impression Statement Lindsay Rice reports MD pleased with her progress and feels that if she continues to improve, she will be released  from further f/u with the MD as of her next appt in July. She notes MD told her to ask about stretches for muscle spasms in her neck which appear  to be SCM by pt description and palpation, therefore instructed pt in SCM stretches. Also added more upper body postural strengthening with core stabilization with HEP updated at pt request. Given increased number of HEP exercises, instructed pt to reduce frequency with theraband strengthening exercises to qod while continuing stretches on a daily basis.   ? 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 Rehab Potential Good   ? PT Frequency 2x / week   ? PT Duration 6 weeks   ? PT Treatment/Interventions ADLs/Self Care Home Management;Cryotherapy;Electrical Stimulation;Moist Heat;DME Instruction;Gait training;Stair training;Functional mobility training;Therapeutic activities;Therapeutic exercise;Balance training;Neuromuscular re-education;Patient/family education;Manual techniques;Passive range of motion;Dry needling;Taping;Spinal Manipulations   ? PT Next Visit Plan progress lumbopelvic/LE flexibility and strengthening; MT and/or modalitites to address LBP and abnormal muscle tension in R glutes   ? PT Home Exercise Plan Access Code: [email or web address]   ? Consulted and Agree with Plan of Care Patient   ? ?  ?  ? ?  ? ? ?Patient will benefit from skilled therapeutic intervention in order to improve the following deficits and impairments:  Decreased activity tolerance, Decreased balance, Decreased endurance, Decreased knowledge of precautions, Decreased mobility, Decreased strength, Difficulty walking, Increased fascial restricitons, Increased muscle spasms, Impaired perceived functional ability, Impaired  flexibility, Impaired UE functional use, Postural dysfunction, Improper body mechanics, Pain ? ?Visit Diagnosis: ?Hemiplegia and hemiparesis following cerebral infarction affecting left non-dominant side ([facility]

## 2021-06-14 ENCOUNTER — Ambulatory Visit: Payer: Medicare PPO

## 2021-06-14 DIAGNOSIS — M6281 Muscle weakness (generalized): Secondary | ICD-10-CM

## 2021-06-14 DIAGNOSIS — I69354 Hemiplegia and hemiparesis following cerebral infarction affecting left non-dominant side: Secondary | ICD-10-CM

## 2021-06-14 DIAGNOSIS — M62830 Muscle spasm of back: Secondary | ICD-10-CM

## 2021-06-14 DIAGNOSIS — R262 Difficulty in walking, not elsewhere classified: Secondary | ICD-10-CM

## 2021-06-14 DIAGNOSIS — M5459 Other low back pain: Secondary | ICD-10-CM

## 2021-06-14 NOTE — Therapy (Signed)
[place] ?[facility] ?[place] ?[place], [place], [address] ?Phone: [phone]   Fax:  [phone] ? ?Physical Therapy Treatment ? ?Patient Details  ?Name: Lindsay Rice ?MRN: [id number] ?Date of Birth: [birth year] ?Referring Provider (PT): Courtney Heys, MD ? ? ?Encounter Date: 06/14/2021 ? ? PT End of Session - 06/14/21 1103   ? ? Visit Number 6   ? Number of Visits 13   ? Date for PT Re-Evaluation 07/10/21   ? Authorization Type [employer] & [employer]   ? Authorization Time Period [employer] Medicare: 05/30/20 - 07/10/21   ? Authorization - Visit Number 5   ? Authorization - Number of Visits 12   ? PT Start Time 1018   ? PT Stop Time 1100   ? PT Time Calculation (min) 42 min   ? Activity Tolerance Patient tolerated treatment well   ? Behavior During Therapy [facility] for tasks assessed/performed   ? ?  ?  ? ?  ? ? ?Past Medical History:  ?Diagnosis Date  ? Diabetes mellitus without complication ([employer] [place])   ? Hypertension   ? Migraine headache   ? Mitral valve prolapse   ? Obesity (BMI 30.0-34.9)   ? Sleep apnea   ? ? ?History reviewed. No pertinent surgical history. ? ?There were no vitals filed for this visit. ? ? Subjective Assessment - 06/14/21 1023   ? ? Subjective Pt reports some knee pain from walking yesterday and shoulder pain.   ? 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 Diagnostic tests 02/08/22 - Small, ill-defined subacute infarct along the para thalamic region on the right.   ? Patient Stated Goals "To strengthen my L leg a little bit more"   ? Currently in Pain? Yes   ? Pain Score 7    ? Pain Location Knee   ? Pain Orientation Right   ? Pain Descriptors / Indicators Constant;Aching   ? Pain Type Acute pain   ? Pain Score 5   ? Pain Location Shoulder   ? Pain Orientation Right   ? Pain Descriptors / Indicators Sharp   ? Pain Type Acute pain   ? ?  ?  ? ?  ? ? ? ? ? ? ? ? ? ? ? ? ? ? ? ? ? ? ? ? [place] Adult PT  Treatment/Exercise - 06/14/21 0001   ? ?  ? Exercises  ? Exercises Lumbar;Shoulder   ?  ? Lumbar Exercises: Aerobic  ? Nustep L6 x 6 min (UE/LE)   ?  ? Lumbar Exercises: Standing  ? Heel Raises 20 reps;2 seconds   ? Functional Squats 20 reps   ? Other Standing Lumbar Exercises farmer carries 2x around clinic with green weight ball, 2x with 4lb weight   ?  ? Knee/Hip Exercises: Standing  ? Other Standing Knee Exercises fwd steps over black foam block x 10; 2 black foam rolls R/L x 10   ?  ? Shoulder Exercises: Standing  ? Horizontal ABduction Strengthening;Both;10 reps;Theraband   ? Theraband Level (Shoulder Horizontal ABduction) Level 2 (Red)   ? External Rotation Strengthening;Both;20 reps;Theraband   ? Theraband Level (Shoulder External Rotation) Level 2 (Red)   ? ?  ?  ? ?  ? ? ? ? ? ? ? ? ? ? ? ? PT Short Term Goals - 06/07/21 1034   ? ?  ? PT SHORT TERM GOAL #1  ?  Title Patient will be independent in initial HEP to improve strength/mobility for better functional independence with ADLs   ? Status Achieved   06/07/21  ? Target Date 06/19/21   ? ?  ?  ? ?  ? ? ? ? PT Long Term Goals - 06/07/21 1027   ? ?  ? PT LONG TERM GOAL #1  ? Title Patient will demonstrate independent use of ongoing/advanced HEP to facilitate ability to maintain/progress functional gains from skilled physical therapy services   ? Status On-going   ? Target Date 07/10/21   ?  ? PT LONG TERM GOAL #2  ? Title Patient will demonstrate improved B LE strength to >/= 4+/5 for improved stability and ease of mobility   ? Status On-going   ? Target Date 07/10/21   ?  ? PT LONG TERM GOAL #3  ? Title Patient to report reduction in frequency and intensity of LBP by >/= 50% to allow for improved activity and walking tolerance   ? Status Achieved   06/07/21- 50% improvement  ? Target Date 07/10/21   ?  ? PT LONG TERM GOAL #4  ? Title Patient will improve 5x STS time to </= 12.6 seconds to demonstrated improved functional strength and transfer efficiency   ?  Status On-going   ? Target Date 07/10/21   ?  ? PT LONG TERM GOAL #5  ? Title Patient to report ability to perform ADLs, household tasks and leisure activities including walking her dog without limitation due to pain, weakness or lack of endurance   ? Status On-going   ? Target Date 07/10/21   ?  ? PT LONG TERM GOAL #6  ? Title Patient will improve FOTO to >/= 65 to demonstrate improving function   ? Status On-going   ? Target Date 07/10/21   ? ?  ?  ? ?  ? ? ? ? ? ? ? ? Plan - 06/14/21 1122   ? ? Clinical Impression Statement Pt responded well to the progression of exercises today. Provided cuing with exercises as needed for proper form and to prevent compensation with exercises. She did well with standing TE w/o UE support and even progressed to dynamic exercises w/o UE support. Pt was w/o any complaints post session.   ? Personal Factors and Comorbidities Age;Comorbidity 3+;Fitness;Past/Current Experience;Time since onset of injury/illness/exacerbation   ? 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 PT Frequency 2x / week   ? PT Duration 6 weeks   ? PT Treatment/Interventions ADLs/Self Care Home Management;Cryotherapy;Electrical Stimulation;Moist Heat;DME Instruction;Gait training;Stair training;Functional mobility training;Therapeutic activities;Therapeutic exercise;Balance training;Neuromuscular re-education;Patient/family education;Manual techniques;Passive range of motion;Dry needling;Taping;Spinal Manipulations   ? PT Next Visit Plan progress lumbopelvic/LE flexibility and strengthening; MT  and/or modalitites to address LBP and abnormal muscle tension in R glutes   ? PT Home Exercise Plan Access Code: [email or web address]   ? Consulted and Agree with Plan of Care Patient   ? ?  ?  ? ?  ? ? ?Patient will benefit from skilled therapeutic intervention in order to improve the following deficits and impairments:  Decreased activity tolerance, Decreased balance, Decreased endurance,  Decreased knowledge of precautions, Decreased mobility, Decreased strength, Difficulty walking, Increased fascial restricitons, Increased muscle spasms, Impaired perceived functional ability, Impaired flexibility, Impaired UE functional use, Postural dysfunction, Improper body mechanics, Pain ? ?Visit Diagnosis: ?Hemiplegia and hemiparesis following cerebral infarction affecting left non-dominant side ([place]) ? ?Other low back pain ? ?Muscle weakness (generalized) ? ?Muscle spasm of back ? ?Difficulty in walking, not elsewhere classified ? ? ? ? ?Problem List ?Patient Active Problem List  ? Diagnosis Date Noted  ? Sepsis with acute organ dysfunction without septic shock ([place]) 03/29/2021  ? Fever, unknown origin 03/29/2021  ? Fever 03/29/2021  ? Essential hypertension 03/10/2021  ? Diabetes mellitus ([place]) 03/10/2021  ? Left hemiparesis ([place]) 03/10/2021  ? Infarction of right basal ganglia ([place]) 02/10/2021  ? Stroke determined by clinical assessment ([place]) 02/07/2021  ? Other obesity due to excess calories 08/09/2016  ? DDD (degenerative disc disease), cervical 10/28/2015  ? Chronic headache 05/05/2015  ? Acquired hypothyroidism 03/24/2015  ? ? ?Lindsay Rice, PTA ?06/14/2021, 12:14 PM ? ?[place] ?[facility] ?[place] ?[place], [place], [address] ?Phone: [phone]   Fax:  [phone] ? ?Name: Lindsay Rice ?MRN: [id number] ?Date of Birth: 1950-04-19 ? ? ? ?

## 2021-06-19 ENCOUNTER — Ambulatory Visit: Payer: Medicare PPO | Attending: Physical Medicine and Rehabilitation

## 2021-06-19 DIAGNOSIS — R262 Difficulty in walking, not elsewhere classified: Secondary | ICD-10-CM | POA: Diagnosis present

## 2021-06-19 DIAGNOSIS — I69354 Hemiplegia and hemiparesis following cerebral infarction affecting left non-dominant side: Secondary | ICD-10-CM | POA: Insufficient documentation

## 2021-06-19 DIAGNOSIS — M6281 Muscle weakness (generalized): Secondary | ICD-10-CM | POA: Insufficient documentation

## 2021-06-19 DIAGNOSIS — M5459 Other low back pain: Secondary | ICD-10-CM | POA: Insufficient documentation

## 2021-06-19 DIAGNOSIS — M62830 Muscle spasm of back: Secondary | ICD-10-CM | POA: Insufficient documentation

## 2021-06-19 NOTE — Therapy (Signed)
[place] ?[facility] ?[address]  [address] ?[place], [place], [address] ?Phone: [phone]   Fax:  [phone] ? ?Physical Therapy Treatment ? ?Patient Details  ?Name: Lindsay Rice ?MRN: [id number] ?Date of Birth: [birth year] ?Referring Provider (PT): Genice Rouge, MD ? ? ?Encounter Date: 06/19/2021 ? ? PT End of Session - 06/19/21 1157   ? ? Visit Number 7   ? Number of Visits 13   ? Date for PT Re-Evaluation 07/10/21   ? Authorization Type [employer] & [employer]   ? Authorization Time Period [employer]: 05/30/20 - 07/10/21   ? Authorization - Visit Number 6   ? Authorization - Number of Visits 12   ? PT Start Time 1101   ? PT Stop Time 1141   ? PT Time Calculation (min) 40 min   ? Activity Tolerance Patient tolerated treatment well   ? Behavior During Therapy [facility] for tasks assessed/performed   ? ?  ?  ? ?  ? ? ?Past Medical History:  ?Diagnosis Date  ? Diabetes mellitus without complication ([facility])   ? Hypertension   ? Migraine headache   ? Mitral valve prolapse   ? Obesity (BMI 30.0-34.9)   ? Sleep apnea   ? ? ?History reviewed. No pertinent surgical history. ? ?There were no vitals filed for this visit. ? ? Subjective Assessment - 06/19/21 1106   ? ? Subjective Pt reports feeling off balance at times but more notably when tired.   ? 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 Diagnostic tests 02/08/22 - Small, ill-defined subacute infarct along the para thalamic region on the right.   ? Patient Stated Goals "To strengthen my L leg a little bit more"   ? Currently in Pain? Yes   ? Pain Score 4    ? Pain Location Shoulder   ? Pain Orientation Right   ? Pain Descriptors / Indicators Aching   ? Pain Type Acute pain   ? Pain Score 4   ? Pain Location Shoulder   ? Pain Orientation Right   ? Pain Descriptors / Indicators Sharp   ? Pain Type Acute pain   ? ?  ?  ? ?  ? ? ? ? ? ? ? ? ? ? ? ? ? ? ? ? ? ? ? ? [facility] Adult PT Treatment/Exercise  - 06/19/21 0001   ? ?  ? Exercises  ? Exercises Lumbar;Shoulder   ?  ? Lumbar Exercises: Aerobic  ? Nustep L6 x 6 min (UE/LE)   ?  ? Lumbar Exercises: Standing  ? Row Strengthening;Both;10 reps;Theraband   ? Theraband Level (Row) Level 2 (Red)   ? Row Limitations in tandem stance   ? Shoulder Extension Strengthening;Both;10 reps;Theraband   ? Theraband Level (Shoulder Extension) Level 2 (Red)   ? Shoulder Extension Limitations 2x10; in tandem stance   ? Other Standing Lumbar Exercises farmer carries 4x around clinic 5lb weight in L hand;   ? Other Standing Lumbar Exercises D2 flexion yellow weight ball x 10   ?  ? Lumbar Exercises: Seated  ? Long [employer] on Chair AROM;Strengthening;Both;10 reps;Weights   ? LAQ on Chair Weights (lbs) 2   ? Other Seated Lumbar Exercises marches with 2lb weights x 10   ?  ? Knee/Hip Exercises: Standing  ? Hip Flexion Stengthening;Both;10 reps;Knee straight;2 sets   ? Hip Flexion Limitations red TB   ? Hip  Abduction Stengthening;Both;10 reps;Knee straight;2 sets   ? Abduction Limitations red TB   ? Hip Extension Stengthening;Both;10 reps;Knee straight;2 sets   ? Extension Limitations red TB   ?  ? Knee/Hip Exercises: Seated  ? Marching Both;2 sets;10 reps;Strengthening   ? Marching Weights 2 lbs.   ? ?  ?  ? ?  ? ? ? ? ? ? ? ? ? ? ? ? PT Short Term Goals - 06/07/21 1034   ? ?  ? PT SHORT TERM GOAL #1  ? Title Patient will be independent in initial HEP to improve strength/mobility for better functional independence with ADLs   ? Status Achieved   06/07/21  ? Target Date 06/19/21   ? ?  ?  ? ?  ? ? ? ? PT Long Term Goals - 06/07/21 1027   ? ?  ? PT LONG TERM GOAL #1  ? Title Patient will demonstrate independent use of ongoing/advanced HEP to facilitate ability to maintain/progress functional gains from skilled physical therapy services   ? Status On-going   ? Target Date 07/10/21   ?  ? PT LONG TERM GOAL #2  ? Title Patient will demonstrate improved B LE strength to >/= 4+/5 for improved  stability and ease of mobility   ? Status On-going   ? Target Date 07/10/21   ?  ? PT LONG TERM GOAL #3  ? Title Patient to report reduction in frequency and intensity of LBP by >/= 50% to allow for improved activity and walking tolerance   ? Status Achieved   06/07/21- 50% improvement  ? Target Date 07/10/21   ?  ? PT LONG TERM GOAL #4  ? Title Patient will improve 5x STS time to </= 12.6 seconds to demonstrated improved functional strength and transfer efficiency   ? Status On-going   ? Target Date 07/10/21   ?  ? PT LONG TERM GOAL #5  ? Title Patient to report ability to perform ADLs, household tasks and leisure activities including walking her dog without limitation due to pain, weakness or lack of endurance   ? Status On-going   ? Target Date 07/10/21   ?  ? PT LONG TERM GOAL #6  ? Title Patient will improve FOTO to >/= 65 to demonstrate improving function   ? Status On-going   ? Target Date 07/10/21   ? ?  ?  ? ?  ? ? ? ? ? ? ? ? Plan - 06/19/21 1158   ? ? Clinical Impression Statement Pt demonstrated some challenges with the progressed exercises today. Showed more dificulty with tandem stance L LE in front. Reported fatigue after the 3 way hip with red TB. Showed a tendency to buckle L knee with R hip exercises. Focus on TE today was WB exercises to create more stabilization from the trunk and hips to improve balance and reduce fall risk.   ? Personal Factors and Comorbidities Age;Comorbidity 3+;Fitness;Past/Current Experience;Time since onset of injury/illness/exacerbation   ? 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 with repeat tears post-op, L ankle surgery for tumor excision, R knee surgery 05/03/20 - arthroscopy with partial medial meniscectomy, DM, HTN, migraine headaches, vertigo, hypothyroidism, asthma, sleep apnea, GI dysfunction, incontinence,  kidney stones, BMI >30   ? PT Frequency 2x / week   ? PT Duration 6 weeks   ? PT  Treatment/Interventions ADLs/Self Care Home Management;Cryotherapy;Electrical Stimulation;Moist Heat;DME Instruction;Gait training;Stair training;Functional mobility training;Therapeutic activities;Therapeutic exercise;Balance training;Neuromuscular re-education;Patient/family education;Manual techniques;Passive range of motion;Dry needling;Taping;Spinal Manipulations   ? PT Next Visit Plan progress lumbopelvic/LE flexibility and strengthening; MT and/or modalitites to address LBP and abnormal muscle tension in R glutes   ? PT Home Exercise Plan Access Code: [email or web address]   ? Consulted and Agree with Plan of Care Patient   ? ?  ?  ? ?  ? ? ?Patient will benefit from skilled therapeutic intervention in order to improve the following deficits and impairments:  Decreased activity tolerance, Decreased balance, Decreased endurance, Decreased knowledge of precautions, Decreased mobility, Decreased strength, Difficulty walking, Increased fascial restricitons, Increased muscle spasms, Impaired perceived functional ability, Impaired flexibility, Impaired UE functional use, Postural dysfunction, Improper body mechanics, Pain ? ?Visit Diagnosis: ?Hemiplegia and hemiparesis following cerebral infarction affecting left non-dominant side ([facility]) ? ?Other low back pain ? ?Muscle weakness (generalized) ? ?Difficulty in walking, not elsewhere classified ? ? ? ? ?Problem List ?Patient Active Problem List  ? Diagnosis Date Noted  ? Sepsis with acute organ dysfunction without septic shock ([facility]) 03/29/2021  ? Fever, unknown origin 03/29/2021  ? Fever 03/29/2021  ? Essential hypertension 03/10/2021  ? Diabetes mellitus ([facility]) 03/10/2021  ? Left hemiparesis ([facility]) 03/10/2021  ? Infarction of right basal ganglia ([facility]) 02/10/2021  ? Stroke determined by clinical assessment ([facility]) 02/07/2021  ? Other obesity due to excess calories 08/09/2016  ? DDD (degenerative disc  disease), cervical 10/28/2015  ? Chronic headache 05/05/2015  ? Acquired hypothyroidism 03/24/2015  ? ? ?Darleene Cleaver, PTA ?06/19/2021, 12:16 PM ? ?[place] [facility]

## 2021-06-21 ENCOUNTER — Ambulatory Visit: Payer: Medicare PPO | Admitting: Physical Therapy

## 2021-06-21 ENCOUNTER — Encounter: Payer: Self-pay | Admitting: Physical Therapy

## 2021-06-21 DIAGNOSIS — I69354 Hemiplegia and hemiparesis following cerebral infarction affecting left non-dominant side: Secondary | ICD-10-CM | POA: Diagnosis not present

## 2021-06-21 DIAGNOSIS — M62830 Muscle spasm of back: Secondary | ICD-10-CM

## 2021-06-21 DIAGNOSIS — M5459 Other low back pain: Secondary | ICD-10-CM

## 2021-06-21 DIAGNOSIS — R262 Difficulty in walking, not elsewhere classified: Secondary | ICD-10-CM

## 2021-06-21 DIAGNOSIS — M6281 Muscle weakness (generalized): Secondary | ICD-10-CM

## 2021-06-21 NOTE — Patient Instructions (Signed)
? ? ?  Access Code: [email or web address] ?URL: [email or web address] ?Date: 06/21/2021 ?Prepared by: Glenetta Hew ? ?Exercises ?- Seated Flexion Stretch with Swiss Ball  - 2 x daily - 7 x weekly - 3 reps - 30 sec hold ?- Seated Thoracic Flexion and Rotation with Swiss Ball  - 2 x daily - 7 x weekly - 3 reps - 30 sec hold ?- Supine ITB Stretch with Strap  - 2 x daily - 7 x weekly - 3 reps - 30 sec hold ?- Standing ITB Stretch  - 2 x daily - 7 x weekly - 3 reps - 30 sec hold ?- Supine March with Resistance Band  - 1 x daily - 3 x weekly - 2 sets - 10 reps - 2-3 sec hold hold ?- Hooklying Isometric Clamshell  - 1 x daily - 3 x weekly - 2 sets - 10 reps - 3 sec hold ?- Hip Abduction with Resistance Loop  - 1 x daily - 3 x weekly - 2 sets - 10 reps - 3 sec hold ?- Hip Extension with Resistance Loop  - 1 x daily - 3 x weekly - 2 sets - 10 reps - 3 sec hold ?- Standing Glute Med Mobilization with Small Ball on Wall  - 1 x daily - 7 x weekly - 1-2 reps - 1-2 min hold ?- Seated March with Resistance  - 1 x daily - 3 x weekly - 2 sets - 10 reps - 3 sec hold ?- Seated Isometric Hip Abduction with Resistance  - 1 x daily - 3 x weekly - 2 sets - 10 reps - 3 sec hold ?- Sternocleidomastoid Stretch  - 2 x daily - 7 x weekly - 3 reps - 30 sec hold ?- Standing Bilateral Low Shoulder Row with Anchored Resistance  - 1 x daily - 3 x weekly - 2 sets - 10 reps - 5 sec hold ?- Scapular Retraction with Resistance Advanced  - 1 x daily - 3 x weekly - 2 sets - 10 reps - 5 sec hold ?- Shoulder External Rotation and Scapular Retraction with Resistance  - 1 x daily - 3 x weekly - 2 sets - 10 reps - 3-5 sec hold ?- Supine Hamstring Stretch with Strap  - 2 x daily - 7 x weekly - 3 reps - 30 sec hold ?- Supine Quadriceps Stretch with Strap on Table  - 2 x daily - 7 x weekly - 3 reps - 30 sec hold ?- Clamshell with Resistance  - 1 x daily - 7 x weekly - 2 sets - 10 reps - 3-5 sec hold ?- Heel Toe Raises with Counter Support  - 1 x daily - 7 x  weekly - 2 sets - 10 reps - 3 sec hold ?- Standing Eccentric Heel Raise  - 1 x daily - 7 x weekly - 2 sets - 10 reps - 3 sec hold ?

## 2021-06-21 NOTE — Therapy (Signed)
[facility] ?[facility] ?[place] ?[place], [place], [address] ?Phone: [phone]   Fax:  [phone] ? ?Physical Therapy Treatment ? ?Patient Details  ?Name: Lenyx Boody ?MRN: [id number] ?Date of Birth: February 23, 1950 ?Referring Provider (PT): Courtney Heys, MD ? ? ?Encounter Date: 06/21/2021 ? ? PT End of Session - 06/21/21 1020   ? ? Visit Number 8   ? Number of Visits 13   ? Date for PT Re-Evaluation 07/10/21   ? Authorization Type [employer]   ? Authorization Time Period [employer]: 05/30/20 - 07/10/21   ? Authorization - Visit Number 7   ? Authorization - Number of Visits 12   ? PT Start Time 1020   ? PT Stop Time 1117   ? PT Time Calculation (min) 57 min   ? Activity Tolerance Patient tolerated treatment well   ? Behavior During Therapy [facility] for tasks assessed/performed   ? ?  ?  ? ?  ? ? ?Past Medical History:  ?Diagnosis Date  ? Diabetes mellitus without complication ([place])   ? Hypertension   ? Migraine headache   ? Mitral valve prolapse   ? Obesity (BMI 30.0-34.9)   ? Sleep apnea   ? ? ?History reviewed. No pertinent surgical history. ? ?There were no vitals filed for this visit. ? ? Subjective Assessment - 06/21/21 1021   ? ? Subjective Pt reports her legs have been cramping since her last therapy session.   ? 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 Diagnostic tests 02/08/22 - Small, ill-defined subacute infarct along the para thalamic region on the right.   ? Patient Stated Goals "To strengthen my L leg a little bit more"   ? Currently in Pain? Yes   ? Pain Score 8    ? Pain Location Knee   ? Pain Orientation Right   ? Pain Descriptors / Indicators Aching   ? Pain Type Acute pain   ? Pain Frequency Intermittent   ? Pain Score 8   ? Pain Location Shoulder   ? Pain Orientation Right   ? Pain Descriptors / Indicators Constant;Sharp   ? Pain Type Acute pain   ? Pain Frequency Constant   ? ?  ?  ? ?   ? ? ? ? ? [facility] PT Assessment - 06/21/21 1020   ? ?  ? Assessment  ? 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 Referring Provider (PT) Courtney Heys, MD   ? Onset Date/Surgical Date 02/07/21   ? Next MD Visit 09/18/21   ?  ? Strength  ? Right Hip Flexion 4+/5   ? Right Hip Extension 4/5   ? Right Hip External Rotation  4/5   ? Right Hip Internal Rotation 4/5   ? Right Hip ABduction 4/5   ? Right Hip ADduction 4/5   ? Left Hip Flexion 4/5   ? Left Hip Extension 4/5   ? Left Hip External Rotation 4-/5   ? Left Hip Internal Rotation 4/5   ? Left Hip ABduction 4/5   ? Left Hip ADduction 4/5   ? Right Knee Flexion 4+/5   ? Right Knee Extension 4+/5   ? Left Knee Flexion 4+/5   ? Left Knee Extension 4+/5   ? Right Ankle Dorsiflexion 4+/5   ? Right Ankle Plantar Flexion 4+/5   15 SLS heel raises - limited  by R knee pain  ? Left Ankle Dorsiflexion 4+/5   ? Left Ankle Plantar Flexion 4/5   14 SLS heel raises - limited lift on some reps  ?  ? Standardized Balance Assessment  ? Five times sit to stand comments  13.67 sec   ? ?  ?  ? ?  ? ? ? ? ? ? ? ? ? ? ? ? ? ? ? ? [facility] Adult PT Treatment/Exercise - 06/21/21 1020   ? ?  ? Lumbar Exercises: Stretches  ? Passive Hamstring Stretch Right;Left;2 reps;30 seconds   ? Passive Hamstring Stretch Limitations 1st rep - manual by PT, 2nd rep - hooklying with strap; both reps + DF for calf stretch   ? Hip Flexor Stretch Right;Left;2 reps;30 seconds   ? Hip Flexor Stretch Limitations mod thomas with strap   ?  ? Lumbar Exercises: Aerobic  ? Nustep L7 x 6 min (UE/LE)   ?  ? Lumbar Exercises: Standing  ? Heel Raises 10 reps;3 seconds   3 sets  ? Heel Raises Limitations 1st set - heel/toe raises; 2nd & 3rd sets - B concentric with R/L single leg eccentric   ?  ? Lumbar Exercises: Sidelying  ? Clam Left;Right;10 reps;3 seconds   2 sets  ? Clam Limitations 1st set - red TB, 2nd set - green TB   ?  ? Knee/Hip Exercises: Seated  ? Clamshell with TheraBand Green   15 x 3"  ?  Marching Both;10 reps;Strengthening   ? Marching Limitations looped green TB above knees   ? ?  ?  ? ?  ? ? ? ? ? ? ? ? ? ? PT Education - 06/21/21 1115   ? ? Education Details HEP update   ? Person(s) Educated Patient   ? Methods Explanation;Demonstration;Verbal cues;Handout   ? Comprehension Verbalized understanding;Verbal cues required;Returned demonstration;Need further instruction   ? ?  ?  ? ?  ? ? ? PT Short Term Goals - 06/07/21 1034   ? ?  ? PT SHORT TERM GOAL #1  ? Title Patient will be independent in initial HEP to improve strength/mobility for better functional independence with ADLs   ? Status Achieved   06/07/21  ? Target Date 06/19/21   ? ?  ?  ? ?  ? ? ? ? PT Long Term Goals - 06/21/21 1027   ? ?  ? PT LONG TERM GOAL #1  ? Title Patient will demonstrate independent use of ongoing/advanced HEP to facilitate ability to maintain/progress functional gains from skilled physical therapy services   ? Status Partially Met   ? Target Date 07/10/21   ?  ? PT LONG TERM GOAL #2  ? Title Patient will demonstrate improved B LE strength to >/= 4+/5 for improved stability and ease of mobility   ? Status Partially Met   06/21/21 - met for knees, partially met for ankles and R hip flexion  ? Target Date 07/10/21   ?  ? PT LONG TERM GOAL #3  ? Title Patient to report reduction in frequency and intensity of LBP by >/= 50% to allow for improved activity and walking tolerance   ? Status Achieved   06/07/21- 50% improvement  ? Target Date 07/10/21   ?  ? PT LONG TERM GOAL #4  ? Title Patient will improve 5x STS time to </= 12.6 seconds to demonstrated improved functional strength and transfer efficiency   ? Baseline 15.59 sec (05/29/21); 13.67 sec (06/21/21)   ?  Status On-going   ? Target Date 07/10/21   ?  ? PT LONG TERM GOAL #5  ? Title Patient to report ability to perform ADLs, household tasks and leisure activities including walking her dog without limitation due to pain, weakness or lack of endurance   ? Status Partially Met    06/21/21 - Pt reports walking 1/2 mile ~4-5x/day with her dog  ? Target Date 07/10/21   ?  ? PT LONG TERM GOAL #6  ? Title Patient will improve FOTO to >/= 65 to demonstrate improving function   ? Status On-going   ? Target Date 07/10/21   ? ?  ?  ? ?  ? ? ? ? ? ? ? ? Plan - 06/21/21 1117   ? ? Clinical Impression Statement Marjan reports increased muscle spasms since last PT visit, therefore provided instruction in stretching to help alleviate spasms with some relief noted. 5xSTS time reduced by ~2 sec and currently w/in 1 sec of goal. B LE strength improved with average hip MMT now 4/5, with knees and ankles grossly 4+/5. Exercises progressed to target areas of ongoing weakness with HEP updated accordingly. Will continue to focus on strengthening and balance in upcoming visits.   ? 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 Rehab Potential Good   ? PT Frequency 2x / week   ? PT Duration 6 weeks   ? PT Treatment/Interventions ADLs/Self Care Home Management;Cryotherapy;Electrical Stimulation;Moist Heat;DME Instruction;Gait training;Stair training;Functional mobility training;Therapeutic activities;Therapeutic exercise;Balance training;Neuromuscular re-education;Patient/family education;Manual techniques;Passive range of motion;Dry needling;Taping;Spinal Manipulations   ? PT Next Visit Plan progress lumbopelvic/LE flexibility and strengthening; shoulder strengthening to address R shoulder pain; balance training and dynamic stepping activities; MT and/or modalitites as indicated   ? PT Home Exercise  Plan Access Code: [email or web address]   ? Consulted and Agree with Plan of Care Patient   ? ?  ?  ? ?  ? ? ?Patient will benefit from skilled therapeutic intervention in order to improve the following deficits and impair

## 2021-06-26 ENCOUNTER — Ambulatory Visit: Payer: Medicare PPO | Admitting: Physical Therapy

## 2021-06-26 ENCOUNTER — Encounter: Payer: Self-pay | Admitting: Physical Therapy

## 2021-06-26 DIAGNOSIS — M6281 Muscle weakness (generalized): Secondary | ICD-10-CM

## 2021-06-26 DIAGNOSIS — M5459 Other low back pain: Secondary | ICD-10-CM

## 2021-06-26 DIAGNOSIS — R262 Difficulty in walking, not elsewhere classified: Secondary | ICD-10-CM

## 2021-06-26 DIAGNOSIS — M62830 Muscle spasm of back: Secondary | ICD-10-CM

## 2021-06-26 DIAGNOSIS — I69354 Hemiplegia and hemiparesis following cerebral infarction affecting left non-dominant side: Secondary | ICD-10-CM | POA: Diagnosis not present

## 2021-06-26 NOTE — Therapy (Signed)
[facility] ?[facility] ?[place] ?[place], [place], [address] ?Phone: [phone]   Fax:  [phone] ? ?Physical Therapy Treatment ? ?Patient Details  ?Name: Lindsay Rice ?MRN: [id number] ?Date of Birth: 04-22-1950 ?Referring Provider (PT): Courtney Heys, MD ? ? ?Encounter Date: 06/26/2021 ? ? PT End of Session - 06/26/21 1016   ? ? Visit Number 9   ? Number of Visits 13   ? Date for PT Re-Evaluation 07/10/21   ? Authorization Type [employer]   ? Authorization Time Period [employer]: 05/30/20 - 07/10/21   ? Authorization - Visit Number 8   ? Authorization - Number of Visits 12   ? PT Start Time 1016   ? PT Stop Time 1100   ? PT Time Calculation (min) 44 min   ? Activity Tolerance Patient tolerated treatment well   ? Behavior During Therapy [facility] for tasks assessed/performed   ? ?  ?  ? ?  ? ? ?Past Medical History:  ?Diagnosis Date  ? Diabetes mellitus without complication ([place])   ? Hypertension   ? Migraine headache   ? Mitral valve prolapse   ? Obesity (BMI 30.0-34.9)   ? Sleep apnea   ? ? ?History reviewed. No pertinent surgical history. ? ?There were no vitals filed for this visit. ? ? Subjective Assessment - 06/26/21 1020   ? ? Subjective Pt reports her R knee has been giving her more trouble and she feels that it is swollen.   ? 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 Diagnostic tests 02/08/22 - Small, ill-defined subacute infarct along the para thalamic region on the right.   ? Patient Stated Goals "To strengthen my L leg a little bit more"   ? Currently in Pain? Yes   ? Pain Score 7    ? Pain Orientation Right   ? Pain Descriptors / Indicators Aching   ? Pain Type Acute pain   ? Pain Score 5   ? Pain Location Shoulder   ? Pain Orientation Right   ? Pain Descriptors / Indicators Aching   ? Pain Type Acute pain   ? ?  ?  ? ?  ? ? ? ? ? ? ? ? ? ? ? ? ? ? ? ? ? ? ? ? [facility] Adult PT Treatment/Exercise -  06/26/21 1016   ? ?  ? Lumbar Exercises: Aerobic  ? Nustep L7 x 6 min (UE/LE)   ?  ? Knee/Hip Exercises: Standing  ? Hip Flexion Right;Left;10 reps;Stengthening;Knee straight   ? Hip Flexion Limitations Red TB on single ankle; 2 pole A for balance   ? Hip ADduction Right;Left;10 reps;Strengthening   ? Hip ADduction Limitations Red TB on single ankle; 2 pole A for balance   ? Hip Abduction Right;Left;10 reps;Stengthening;Knee straight   ? Abduction Limitations Red TB on single ankle; 2 pole A for balance   ? Hip Extension Right;Left;10 reps;Stengthening;Knee straight   ? Extension Limitations Red TB on single ankle; 2 pole A for balance   ?  ? Manual Therapy  ? Manual Therapy Soft tissue mobilization;Taping   ? Soft tissue mobilization STM & IASTM with roller stick to R quads and HS   ? Kinesiotex Edema;Inhibit Muscle   ?  ? Kinesiotix  ? Edema R knee - full knee support   Blue Kinesiotex Gold - Light Touch (sensitive skin tape)  ?  Inhibit Muscle  upper tails extending along R medial and lateral quads - 30-50% stretch   ? ?  ?  ? ?  ? ? ? ? ? ? ? ? ? ? ? ? PT Short Term Goals - 06/07/21 1034   ? ?  ? PT SHORT TERM GOAL #1  ? Title Patient will be independent in initial HEP to improve strength/mobility for better functional independence with ADLs   ? Status Achieved   06/07/21  ? Target Date 06/19/21   ? ?  ?  ? ?  ? ? ? ? PT Long Term Goals - 06/21/21 1027   ? ?  ? PT LONG TERM GOAL #1  ? Title Patient will demonstrate independent use of ongoing/advanced HEP to facilitate ability to maintain/progress functional gains from skilled physical therapy services   ? Status Partially Met   ? Target Date 07/10/21   ?  ? PT LONG TERM GOAL #2  ? Title Patient will demonstrate improved B LE strength to >/= 4+/5 for improved stability and ease of mobility   ? Status Partially Met   06/21/21 - met for knees, partially met for ankles and R hip flexion  ? Target Date 07/10/21   ?  ? PT LONG TERM GOAL #3  ? Title Patient to report  reduction in frequency and intensity of LBP by >/= 50% to allow for improved activity and walking tolerance   ? Status Achieved   06/07/21- 50% improvement  ? Target Date 07/10/21   ?  ? PT LONG TERM GOAL #4  ? Title Patient will improve 5x STS time to </= 12.6 seconds to demonstrated improved functional strength and transfer efficiency   ? Baseline 15.59 sec (05/29/21); 13.67 sec (06/21/21)   ? Status On-going   ? Target Date 07/10/21   ?  ? PT LONG TERM GOAL #5  ? Title Patient to report ability to perform ADLs, household tasks and leisure activities including walking her dog without limitation due to pain, weakness or lack of endurance   ? Status Partially Met   06/21/21 - Pt reports walking 1/2 mile ~4-5x/day with her dog  ? Target Date 07/10/21   ?  ? PT LONG TERM GOAL #6  ? Title Patient will improve FOTO to >/= 65 to demonstrate improving function   ? Status On-going   ? Target Date 07/10/21   ? ?  ?  ? ?  ? ? ? ? ? ? ? ? Plan - 06/26/21 1022   ? ? Clinical Impression Statement Genell reports only minimal relief of the muscle spasms from the stretches and continues to have pain in her R knee with some increased swelling noted. Introduced [employer] including IASTM with roller stick/rolling pin to address increased muscle tension/taut bands. Initiated trial of Ktape using sensitive skin variety tape due pt reported h/o tape sensitivity and cautioned pt to remove tape if it seems to create irritation, otherwise to wear it for no more than 2 days. Strengthening progressed to target 4-way hip strengthening while working on SLS balance with HEP updated accordingly.   ? 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 Rehab Potential Good   ? PT Frequency 2x / week   ? PT Duration 6 weeks   ? PT Treatment/Interventions ADLs/Self Care Home Management;Cryotherapy;Electrical Stimulation;Moist Heat;DME Instruction;Gait training;Stair training;Functional mobility training;Therapeutic activities;Therapeutic exercise;Balance training;Neuromuscular re-education;Patient/family education;Manual techniques;Passive range of motion;Dry needling;Taping;Spinal Manipulations   ? PT Next Visit Plan 10th visit PN; assess response to Ktape; progress lumbopelvic/LE flexibility and strengthening; shoulder strengthening to address R shoulder pain; balance training and dynamic stepping activities; MT and/or modalitites as indicated   ? PT Home Exercise Plan Access Code: [email or web address]   ? Consulted and Agree with Plan of Care Patient   ? ?  ?  ? ?  ? ? ?Patient will benefit from skilled therapeutic intervention in order to improve the following deficits and impairments:  Decreased activity tolerance, Decreased balance, Decreased endurance, Decreased knowledge of precautions, Decreased mobility, Decreased strength, Difficulty walking, Increased fascial restricitons, Increased muscle spasms, Impaired perceived functional ability, Impaired flexibility, Impaired UE functional use, Postural dysfunction, Improper body mechanics, Pain ? ?Visit Diagnosis: ?Hemiplegia and hemiparesis following cerebral infarction affecting left non-dominant side ([place]) ? ?Other low back pain ? ?Muscle weakness (generalized) ? ?Difficulty in walking, not elsewhere classified ? ?Muscle spasm of back ? ? ? ? ?Problem List ?Patient Active Problem List  ? Diagnosis Date Noted  ? Sepsis with acute organ dysfunction without septic shock ([place]) 03/29/2021  ? Fever, unknown origin 03/29/2021  ? Fever 03/29/2021  ? Essential hypertension 03/10/2021  ? Diabetes mellitus ([place]) 03/10/2021  ? Left  hemiparesis ([place]) 03/10/2021  ? Infarction of right basal ganglia ([place]) 02/10/2021  ? Stroke determined by clinical assessment ([place]) 02/07/2021  ? Other obesity due to excess calories 08/09/2016  ? DDD (degene

## 2021-06-26 NOTE — Patient Instructions (Signed)
Access Code: [email or web address] ?URL: [email or web address] ?Date: 06/26/2021 ?Prepared by: Glenetta Hew ? ?Exercises ?- Seated Flexion Stretch with Swiss Ball  - 2 x daily - 7 x weekly - 3 reps - 30 sec hold ?- Seated Thoracic Flexion and Rotation with Swiss Ball  - 2 x daily - 7 x weekly - 3 reps - 30 sec hold ?- Supine ITB Stretch with Strap  - 2 x daily - 7 x weekly - 3 reps - 30 sec hold ?- Standing ITB Stretch  - 2 x daily - 7 x weekly - 3 reps - 30 sec hold ?- Supine March with Resistance Band  - 1 x daily - 3 x weekly - 2 sets - 10 reps - 2-3 sec hold hold ?- Hooklying Isometric Clamshell  - 1 x daily - 3 x weekly - 2 sets - 10 reps - 3 sec hold ?- Standing Glute Med Mobilization with Small Ball on Wall  - 1 x daily - 7 x weekly - 1-2 reps - 1-2 min hold ?- Seated March with Resistance  - 1 x daily - 3 x weekly - 2 sets - 10 reps - 3 sec hold ?- Seated Isometric Hip Abduction with Resistance  - 1 x daily - 3 x weekly - 2 sets - 10 reps - 3 sec hold ?- Sternocleidomastoid Stretch  - 2 x daily - 7 x weekly - 3 reps - 30 sec hold ?- Standing Bilateral Low Shoulder Row with Anchored Resistance  - 1 x daily - 3 x weekly - 2 sets - 10 reps - 5 sec hold ?- Scapular Retraction with Resistance Advanced  - 1 x daily - 3 x weekly - 2 sets - 10 reps - 5 sec hold ?- Shoulder External Rotation and Scapular Retraction with Resistance  - 1 x daily - 3 x weekly - 2 sets - 10 reps - 3-5 sec hold ?- Supine Hamstring Stretch with Strap  - 2 x daily - 7 x weekly - 3 reps - 30 sec hold ?- Supine Quadriceps Stretch with Strap on Table  - 2 x daily - 7 x weekly - 3 reps - 30 sec hold ?- Clamshell with Resistance  - 1 x daily - 7 x weekly - 2 sets - 10 reps - 3-5 sec hold ?- Heel Toe Raises with Counter Support  - 1 x daily - 7 x weekly - 2 sets - 10 reps - 3 sec hold ?- Standing Eccentric Heel Raise  - 1 x daily - 7 x weekly - 2 sets - 10 reps - 3 sec hold ?- Standing Hip Flexion with Anchored Resistance and Chair Support  - 1  x daily - 3 x weekly - 2 sets - 10 reps - 3 sec hold ?- Standing Hip Adduction with Anchored Resistance  - 1 x daily - 3 x weekly - 2 sets - 10 reps - 3 sec hold ?- Standing Hip Extension with Anchored Resistance  - 1 x daily - 3 x weekly - 2 sets - 10 reps - 3 sec hold ?- Standing Hip Abduction with Anchored Resistance  - 1 x daily - 3 x weekly - 2 sets - 10 reps - 3 sec hold ? ? ?

## 2021-06-28 ENCOUNTER — Other Ambulatory Visit: Payer: Self-pay

## 2021-06-28 ENCOUNTER — Ambulatory Visit: Payer: Medicare PPO | Admitting: Physical Therapy

## 2021-06-28 ENCOUNTER — Encounter: Payer: Self-pay | Admitting: Physical Therapy

## 2021-06-28 DIAGNOSIS — M62830 Muscle spasm of back: Secondary | ICD-10-CM

## 2021-06-28 DIAGNOSIS — M5459 Other low back pain: Secondary | ICD-10-CM

## 2021-06-28 DIAGNOSIS — I69354 Hemiplegia and hemiparesis following cerebral infarction affecting left non-dominant side: Secondary | ICD-10-CM | POA: Diagnosis not present

## 2021-06-28 DIAGNOSIS — R262 Difficulty in walking, not elsewhere classified: Secondary | ICD-10-CM

## 2021-06-28 DIAGNOSIS — M6281 Muscle weakness (generalized): Secondary | ICD-10-CM

## 2021-06-28 NOTE — Therapy (Signed)
[place] [facility] [place] ?[address] ?[place], [place], [address] ?Phone: [phone]   Fax:  [phone] ? ?Physical Therapy Treatment and 10th Visit Progress Note ? ?Patient Details  ?Name: Lindsay Rice ?MRN: [id number] ?Date of Birth: [birth year] ?Referring Provider (PT): Lindsay Heys, MD ? ? ?Encounter Date: 06/28/2021 ? ?Physical Therapy Progress Note ? ?Dates of Reporting Period: 05/29/21 to 06/28/21 ? ? PT End of Session - 06/28/21 1106   ? ? Visit Number 10   ? Number of Visits 13   ? Date for PT Re-Evaluation 07/10/21   ? Authorization Type [employer] & [employer]   ? Authorization Time Period [employer] Medicare: 05/30/20 - 07/10/21   ? Authorization - Visit Number 10   ? Authorization - Number of Visits 12   ? PT Start Time 1105   ? PT Stop Time 1145   ? PT Time Calculation (min) 40 min   ? Activity Tolerance Patient tolerated treatment well   ? Behavior During Therapy [facility] for tasks assessed/performed   ? ?  ?  ? ?  ? ? ?Past Medical History:  ?Diagnosis Date  ? Diabetes mellitus without complication ([place])   ? Hypertension   ? Migraine headache   ? Mitral valve prolapse   ? Obesity (BMI 30.0-34.9)   ? Sleep apnea   ? ? ?History reviewed. No pertinent surgical history. ? ?There were no vitals filed for this visit. ? ? Subjective Assessment - 06/28/21 1108   ? ? Subjective The tape has been really helpful. She reports improvements since starting PT. I'm walking better with my cane. My balance is better. Still gets dizzy turning to the right.   ? 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 Limitations Standing;Walking;Lifting;House hold activities   ? How long can you walk comfortably? 1/2 mile 3-4 x/day   ? Diagnostic tests 02/08/22 - Small, ill-defined subacute infarct along the para thalamic region on the right.   ? Patient Stated Goals "To strengthen my L leg a little bit more"   ? Currently in Pain? Yes   ? Pain Score 7    ?  Pain Location Knee   ? Pain Orientation Right   ? Pain Descriptors / Indicators Aching   ? Pain Type Acute pain   ? Pain Score 7   ? Pain Location Shoulder   ? Pain Orientation Right   ? Pain Descriptors / Indicators Aching   ? Pain Type Acute pain   ? Pain Frequency Constant   ? ?  ?  ? ?  ? ? ? ? ? [facility] PT Assessment - 06/28/21 0001   ? ?  ? Observation/Other Assessments  ? Focus on Therapeutic Outcomes (FOTO)  54   ?  ? Strength  ? Right Hip Flexion 4+/5   ? Right Hip Extension 5/5   ? Right Hip External Rotation  5/5   ? Right Hip Internal Rotation 5/5   ? Right Hip ABduction 4+/5   ? Right Hip ADduction 5/5   ? Left Hip Flexion 4/5   ? Left Hip Extension 4+/5   ? Left Hip External Rotation 5/5   ? Left Hip Internal Rotation 5/5   ? Left Hip ABduction 4+/5   ? Left Hip ADduction 4+/5   ? Right Knee Flexion 5/5   ? Right Knee Extension 5/5   ? Left Knee Flexion 4+/5   ? Left Knee  Extension 5/5   ? Right Ankle Dorsiflexion 5/5   ? Right Ankle Plantar Flexion 4+/5   ? Left Ankle Dorsiflexion 5/5   ? Left Ankle Plantar Flexion 4/5   10 SLS heel raises - limited lift on some reps  ? ?  ?  ? ?  ? ? ? ? ? ? ? ? ? ? ? ? ? ? ? ? [facility] Adult PT Treatment/Exercise - 06/28/21 0001   ? ?  ? Lumbar Exercises: Aerobic  ? Stationary Bike L3 x 7 min   ?  ? Lumbar Exercises: Standing  ? Heel Raises 10 reps   ? Heel Raises Limitations unilaterally cues to avoid rocking   ?  ? Knee/Hip Exercises: Standing  ? Hip Flexion Right;Left;Stengthening;Knee straight;15 reps   ? Hip Flexion Limitations Red TB on single ankle; 2 pole A for balance   ? Hip ADduction Right;Left;Strengthening;15 reps   ? Hip ADduction Limitations Red TB on single ankle; 2 pole A for balance   ? Hip Abduction Right;Left;Stengthening;Knee straight;15 reps   ? Abduction Limitations Red TB on single ankle; 2 pole A for balance   ? Hip Extension Right;Left;Stengthening;Knee straight;15 reps   ? Extension Limitations Red TB on single ankle; 2 pole A for balance   ?  ?  Manual Therapy  ? Kinesiotex Edema;Inhibit Muscle   ?  ? Kinesiotix  ? Edema R knee - full knee support   Blue Kinesiotex Gold - Light Touch (sensitive skin tape)  ? Inhibit Muscle  upper tails extending along R medial and lateral quads - 30-50% stretch   ? ?  ?  ? ?  ? ? ? ? ? ? ? ? ? ? ? ? PT Short Term Goals - 06/07/21 1034   ? ?  ? PT SHORT TERM GOAL #1  ? Title Patient will be independent in initial HEP to improve strength/mobility for better functional independence with ADLs   ? Status Achieved   06/07/21  ? Target Date 06/19/21   ? ?  ?  ? ?  ? ? ? ? PT Long Term Goals - 06/28/21 1112   ? ?  ? PT LONG TERM GOAL #1  ? Title Patient will demonstrate independent use of ongoing/advanced HEP to facilitate ability to maintain/progress functional gains from skilled physical therapy services   ? Status Partially Met   ?  ? PT LONG TERM GOAL #2  ? Title Patient will demonstrate improved B LE strength to >/= 4+/5 for improved stability and ease of mobility   ? Status Partially Met   ?  ? PT LONG TERM GOAL #3  ? Title Patient to report reduction in frequency and intensity of LBP by >/= 50% to allow for improved activity and walking tolerance   ? Status Achieved   ?  ? PT LONG TERM GOAL #4  ? Title Patient will improve 5x STS time to </= 12.6 seconds to demonstrated improved functional strength and transfer efficiency   ? Baseline 15.59 sec (05/29/21); 13.67 sec (06/21/21); 10.91 sec (06/28/21)   ? Status Achieved   ?  ? PT LONG TERM GOAL #5  ? Title Patient to report ability to perform ADLs, household tasks and leisure activities including walking her dog without limitation due to pain, weakness or lack of endurance   ? Status Achieved   ?  ? PT LONG TERM GOAL #6  ? Title Patient will improve FOTO to >/= 65 to demonstrate improving function   ?  Baseline 54 (06/28/21 down 4 points)   ? Status On-going   ? ?  ?  ? ?  ? ? ? ? ? ? ? ? Plan - 06/28/21 1758   ? ? Clinical Impression Statement Lindsay Rice reports overall improvement with  ADLS and has met 3/6 LTGs and partially met 2/6. She reports functional improvements, however her FOTO score actually decreased 4 points today vs. intial evaluation. Her strength has improved considerably and she has met her 5x sit to stand goal indicating decreased fall risk. She continues to report right knee pain which was helped by the Kinesiotape last visit. This was reapplied today. She will benefit from continued PT until the end of her POC and should be ready for discharge to her HEP.   ? Personal Factors and Comorbidities Age;Comorbidity 3+;Fitness;Past/Current Experience;Time since onset of injury/illness/exacerbation   ? 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 Examination-Activity Limitations Hygiene/Grooming;Lift;Carry;Caring for Others;Transfers;Stand;Locomotion Level;Stairs   ? Examination-Participation Restrictions Cleaning;Community Activity;Laundry;Meal Prep;Shop;Yard Work   ? Stability/Clinical Decision Making Evolving/Moderate complexity   ? PT Frequency 2x / week   ? PT Duration 6 weeks   ? PT Treatment/Interventions ADLs/Self Care Home Management;Cryotherapy;Electrical Stimulation;Moist Heat;DME Instruction;Gait training;Stair training;Functional mobility training;Therapeutic activities;Therapeutic exercise;Balance training;Neuromuscular re-education;Patient/family education;Manual techniques;Passive range of motion;Dry needling;Taping;Spinal Manipulations   ? PT Next Visit Plan progress lumbopelvic/LE flexibility and strengthening; shoulder strengthening to address R  shoulder pain; balance training and dynamic stepping activities; MT and/or modalitites as indicated   ? PT Home Exercise Plan Access Code: [email or web address]   ? Consulted and Agree with Plan of Care Patient   ? ?  ?

## 2021-07-03 ENCOUNTER — Ambulatory Visit: Payer: Medicare PPO

## 2021-07-03 DIAGNOSIS — M62830 Muscle spasm of back: Secondary | ICD-10-CM

## 2021-07-03 DIAGNOSIS — M5459 Other low back pain: Secondary | ICD-10-CM

## 2021-07-03 DIAGNOSIS — R262 Difficulty in walking, not elsewhere classified: Secondary | ICD-10-CM

## 2021-07-03 DIAGNOSIS — I69354 Hemiplegia and hemiparesis following cerebral infarction affecting left non-dominant side: Secondary | ICD-10-CM | POA: Diagnosis not present

## 2021-07-03 DIAGNOSIS — M6281 Muscle weakness (generalized): Secondary | ICD-10-CM

## 2021-07-03 NOTE — Therapy (Signed)
?OUTPATIENT PHYSICAL THERAPY TREATMENT NOTE ? ? ?Patient Name: Lindsay Rice ?MRN: [id number] ?DOB:[birth year], 71 y.o., female ?Today's Date: 07/03/2021 ? ?PCP: Scherrie Gerlach, PA ?REFERRING PROVIDER: Courtney Heys, MD ? ? PT End of Session - 07/03/21 1400   ? ? Visit Number 11   ? Number of Visits 13   ? Date for PT Re-Evaluation 07/10/21   ? Authorization Type [employer] & [employer]   ? Authorization Time Period [employer] Medicare: 05/30/20 - 07/10/21   ? Authorization - Visit Number 11   ? Authorization - Number of Visits 12   ? PT Start Time 1316   ? PT Stop Time 1358   ? PT Time Calculation (min) 42 min   ? Activity Tolerance Patient tolerated treatment well   ? Behavior During Therapy [facility] for tasks assessed/performed   ? ?  ?  ? ?  ? ? ?Past Medical History:  ?Diagnosis Date  ? Diabetes mellitus without complication ([place])   ? Hypertension   ? Migraine headache   ? Mitral valve prolapse   ? Obesity (BMI 30.0-34.9)   ? Sleep apnea   ? ?History reviewed. No pertinent surgical history. ?Patient Active Problem List  ? Diagnosis Date Noted  ? Sepsis with acute organ dysfunction without septic shock ([place]) 03/29/2021  ? Fever, unknown origin 03/29/2021  ? Fever 03/29/2021  ? Essential hypertension 03/10/2021  ? Diabetes mellitus ([place]) 03/10/2021  ? Left hemiparesis ([place]) 03/10/2021  ? Infarction of right basal ganglia ([place]) 02/10/2021  ? Stroke determined by clinical assessment ([place]) 02/07/2021  ? Other obesity due to excess calories 08/09/2016  ? DDD (degenerative disc disease), cervical 10/28/2015  ? Chronic headache 05/05/2015  ? Acquired hypothyroidism 03/24/2015  ? ? 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 ?THERAPY DIAG:  ?Other low back pain ? ?Muscle weakness (generalized) ? ?Difficulty in walking, not elsewhere classified ? ?Muscle spasm of back ? ?Hemiplegia and hemiparesis following cerebral infarction affecting left non-dominant side ([place]) ? ?PERTINENT HISTORY: 02/07/21 - R basal ganglia  infarct with L hemiparesis ? ?PRECAUTIONS: Fall ? ?SUBJECTIVE: Pt reports that her knee is still "giving her a fit." ? ?PAIN:  ?Are you having pain? Yes: NPRS scale: 12/10 ?Pain location: R knee ?Pain description: aching ? ? ? ? OBJECTIVE:  ? ?  ?Strength   ?  Strength Assessment Site Hip;Knee;Ankle   ?  Right/Left Hip Right;Left   ?  Right Hip Flexion 4/5   ?  Right Hip Extension 3+/5   ?  Right Hip External Rotation  4-/5   ?  Right Hip Internal Rotation 4/5   ?  Right Hip ABduction 4-/5   ?  Right Hip ADduction 4-/5   ?  Left Hip Flexion 4-/5   ?  Left Hip Extension 3+/5   ?  Left Hip External Rotation 4-/5   ?  Left Hip Internal Rotation 4-/5   ?  Left Hip ABduction 3+/5   ?  Left Hip ADduction 3+/5   ?  Right/Left Knee Right;Left   ?  Right Knee Flexion 4+/5   ?  Right Knee Extension 4+/5   ?  Left Knee Flexion 4/5   ?  Left Knee Extension 4/5   ?  Right/Left Ankle Right;Left   ?  Right Ankle Dorsiflexion 4+/5   ?  Right Ankle Plantar Flexion 4+/5   15 SLS heel raises  ?  Left Ankle Dorsiflexion 4/5   ?  Left Ankle Plantar Flexion  4/5   10 SLS heel raises  ?     ?  Palpation  ?  Palpation comment increased muscle tension & TTP in L lumbar paraspinals and glutes   ?     ?  Ambulation/Gait  ?  Ambulation/Gait Yes   ?  Ambulation/Gait Assistance 5: Supervision;7: Independent   ?  Assistive device None   ?  Gait Pattern Within Functional Limits   ?  Ambulation Surface Level;Indoor   ?  Gait velocity 3.2 ft/sec   ?     ?  Standardized Balance Assessment  ?  Standardized Balance Assessment Five Times Sit to Stand;10 meter walk test;Timed Up and Go Test   ?  Five times sit to stand comments  15.59 sec   ?  10 Meter Walk 10.25 sec   ?     ?  Timed Up and Go Test  ?  Normal TUG (seconds) 11.66   ? ? ?TODAY'S TREATMENT:  ?07/03/21 ?Manual Therapy:  ?IASTM to R quads with stainless steel tools ? ?Therapeutic Exercise:  ?Nu Step L6x14mn ?Seated hs curl with peanut ball 20x ?Supine quad stretch with strap 3x20" ?Bridges with  feet on BOSU 5x ( pt limited by hip pain) ?Cybex knee extension 10# BLE x 10 ?Cybex knee flexion 20# BLE x 10 ? ? ? ?HOME EXERCISE PROGRAM: ?Access Code: [email or web address]? ?ASSESSMENT:  ? ?CLINICAL IMPRESSION: ? Pt still reports that her knee pain persist. She demonstrated a lot of muscle restriction along the R quads. She noted just a little bit of knee pain with the seated hamstring curls. Cues to avoid full knee extension with leg extensions to avoid pain. She noted that she still feels unsure about ending PT, I anticipate she will need recert at the end of POC.  ? PT Short Term Goals - 06/07/21 1034   ? ?  ? PT SHORT TERM GOAL #1  ? Title Patient will be independent in initial HEP to improve strength/mobility for better functional independence with ADLs   ? Status Achieved   06/07/21  ? Target Date 06/19/21   ? ?  ?  ? ?  ? ? ? PT Long Term Goals - 06/28/21 1112   ? ?  ? PT LONG TERM GOAL #1  ? Title Patient will demonstrate independent use of ongoing/advanced HEP to facilitate ability to maintain/progress functional gains from skilled physical therapy services   ? Status Partially Met   ?  ? PT LONG TERM GOAL #2  ? Title Patient will demonstrate improved B LE strength to >/= 4+/5 for improved stability and ease of mobility   ? Status Partially Met   ?  ? PT LONG TERM GOAL #3  ? Title Patient to report reduction in frequency and intensity of LBP by >/= 50% to allow for improved activity and walking tolerance   ? Status Achieved   ?  ? PT LONG TERM GOAL #4  ? Title Patient will improve 5x STS time to </= 12.6 seconds to demonstrated improved functional strength and transfer efficiency   ? Baseline 15.59 sec (05/29/21); 13.67 sec (06/21/21); 10.91 sec (06/28/21)   ? Status Achieved   ?  ? PT LONG TERM GOAL #5  ? Title Patient to report ability to perform ADLs, household tasks and leisure activities including walking her dog without limitation due to pain, weakness or lack of endurance   ? Status Achieved   ?  ? PT LONG TERM  GOAL #6  ?  Title Patient will improve FOTO to >/= 65 to demonstrate improving function   ? Baseline 54 (06/28/21 down 4 points)   ? Status On-going   ? ?  ?  ? ?  ? ?Plan: ? ?PT Frequency 2x / week    ?  PT Duration 6 weeks   ?  PT Treatment/Interventions ADLs/Self Care Home Management;Cryotherapy;Electrical Stimulation;Moist Heat;DME Instruction;Gait training;Stair training;Functional mobility training;Therapeutic activities;Therapeutic exercise;Balance training;Neuromuscular re-education;Patient/family education;Manual techniques;Passive range of motion;Dry needling;Taping;Spinal Manipulations   ?  PT Next Visit Plan progress lumbopelvic/LE flexibility and strengthening; shoulder strengthening to address R shoulder pain; balance training and dynamic stepping activities; MT and/or modalitites as indicated   ?  PT Home Exercise Plan Access Code: [email or web address]   ?  Consulted and Agree with Plan of Care Patient   ? ? ? ? ? ?Artist Pais, PTA ?07/03/2021, 2:38 PM ? ?   ?

## 2021-07-05 ENCOUNTER — Ambulatory Visit: Payer: Medicare PPO | Admitting: Physical Therapy

## 2021-07-05 ENCOUNTER — Encounter: Payer: Self-pay | Admitting: Physical Therapy

## 2021-07-05 DIAGNOSIS — M62830 Muscle spasm of back: Secondary | ICD-10-CM

## 2021-07-05 DIAGNOSIS — M5459 Other low back pain: Secondary | ICD-10-CM

## 2021-07-05 DIAGNOSIS — I69354 Hemiplegia and hemiparesis following cerebral infarction affecting left non-dominant side: Secondary | ICD-10-CM

## 2021-07-05 DIAGNOSIS — M6281 Muscle weakness (generalized): Secondary | ICD-10-CM

## 2021-07-05 DIAGNOSIS — R262 Difficulty in walking, not elsewhere classified: Secondary | ICD-10-CM

## 2021-07-05 NOTE — Therapy (Signed)
[place] [facility] [place] [facility] ?[address]  [address] ?[place], [place], [address] ?Phone: [phone]   Fax:  [phone] ? ?Physical Therapy Treatment ? ?Patient Details  ?Name: Lindsay Rice ?MRN: [id number] ?Date of Birth: 05/01/1950 ?Referring Provider (PT): Megan Lovorn, MD ? ? ?Encounter Date: 07/05/2021 ? ? PT End of Session - 07/05/21 1016   ? ? Visit Number 12   ? Number of Visits 13   ? Date for PT Re-Evaluation 07/10/21   ? Authorization Type [employer] & [employer]   ? Authorization Time Period [employer] Medicare: 05/30/20 - 07/10/21   ? Authorization - Visit Number 11   ? Authorization - Number of Visits 12   ? PT Start Time 1016   ? PT Stop Time 1128   ? PT Time Calculation (min) 72 min   ? Activity Tolerance Patient tolerated treatment well   ? Behavior During Therapy [facility] for tasks assessed/performed   ? ?  ?  ? ?  ? ? ?Past Medical History:  ?Diagnosis Date  ? Diabetes mellitus without complication ([facility])   ? Hypertension   ? Migraine headache   ? Mitral valve prolapse   ? Obesity (BMI 30.0-34.9)   ? Sleep apnea   ? ? ?History reviewed. No pertinent surgical history. ? ?There were no vitals filed for this visit. ? ? Subjective Assessment - 07/05/21 1019   ? ? Subjective Pt reports increased pain today - not sure if she overdid things yesterday (went to get her license renewed and then to a stroke support group at [facility]). Pt states she would like to continue with PT but reports her procedure tor her loop recorder implantation is scheduled for 07/27/21.   ? 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 How long can you walk comfortably? 1/2 mile 3-4 x/day   ? Diagnostic tests 02/08/22 - Small, ill-defined subacute infarct along the para thalamic region on the right.   ? Patient Stated Goals "To strengthen my L leg a little bit more"   ? Currently in Pain? Yes   ? Pain Score 8    8-9/10  ? Pain Location Knee   ? Pain Orientation Right   ? Pain  Descriptors / Indicators Sharp   ? Pain Type Acute pain   ? Pain Score 5   ? Pain Location Shoulder   ? Pain Orientation Right   ? Pain Descriptors / Indicators Dull;Aching   ? Pain Score 8   8-9/10  ? Pain Location Back   ? Pain Orientation Lower;Right;Left   ? Pain Descriptors / Indicators Sharp   ? Pain Type Acute pain   ? ?  ?  ? ?  ? ? ? ? ? ? ? ? ? ? ? ? ? ? ? ? ? ? ? ? [facility] Adult PT Treatment/Exercise - 07/05/21 1016   ? ?  ? Lumbar Exercises: Supine  ? Clam 10 reps;3 seconds   ? Clam Limitations TrA/PPT + alt green TB bent knee fallout   ? Bent Knee Raise 10 reps;3 seconds   ? Bent Knee Raise Limitations TrA/PPT + green TB march   ? Bridge --   3 reps  ? Bridge Limitations + green TB hip ABD isometric   discontinued due to increased LBP  ?  ? Shoulder Exercises: Supine  ? Horizontal ABduction Both;10 reps;Strengthening;Theraband   ? Theraband Level (Shoulder Horizontal ABduction) Level 2 (Red)   ?   Horizontal ABduction Limitations + TrA in hooklying   ? External Rotation Both;10 reps;Strengthening;Theraband   ? Theraband Level (Shoulder External Rotation) Level 2 (Red)   ? External Rotation Limitations + TrA in hooklying   ?  ? Modalities  ? Modalities Electrical Stimulation;Moist Heat   ?  ? Moist Heat Therapy  ? Number Minutes Moist Heat 15 Minutes   ? Moist Heat Location Lumbar Spine   ?  ? Electrical Stimulation  ? Electrical Stimulation Location B lumbar paraspinals   ? Electrical Stimulation Action IFC   ? Electrical Stimulation Parameters 80-150 Hz, intensity to tolerance x 15'   ? Electrical Stimulation Goals Pain   ?  ? Manual Therapy  ? Kinesiotex Edema;Inhibit Muscle   ?  ? Kinesiotix  ? Edema R knee - full knee support   Blue Classic Kinesio [place] tape  ? Inhibit Muscle  upper tails extending along R medial and lateral quads - 30-50% stretch   ? ?  ?  ? ?  ? ? ? ? ? ? ? ? ? ? ? ? PT Short Term Goals - 06/07/21 1034   ? ?  ? PT SHORT TERM GOAL #1  ? Title Patient will be independent in initial HEP to  improve strength/mobility for better functional independence with ADLs   ? Status Achieved   06/07/21  ? Target Date 06/19/21   ? ?  ?  ? ?  ? ? ? ? PT Long Term Goals - 06/28/21 1112   ? ?  ? PT LONG TERM GOAL #1  ? Title Patient will demonstrate independent use of ongoing/advanced HEP to facilitate ability to maintain/progress functional gains from skilled physical therapy services   ? Status Partially Met   ?  ? PT LONG TERM GOAL #2  ? Title Patient will demonstrate improved B LE strength to >/= 4+/5 for improved stability and ease of mobility   ? Status Partially Met   ?  ? PT LONG TERM GOAL #3  ? Title Patient to report reduction in frequency and intensity of LBP by >/= 50% to allow for improved activity and walking tolerance   ? Status Achieved   ?  ? PT LONG TERM GOAL #4  ? Title Patient will improve 5x STS time to </= 12.6 seconds to demonstrated improved functional strength and transfer efficiency   ? Baseline 15.59 sec (05/29/21); 13.67 sec (06/21/21); 10.91 sec (06/28/21)   ? Status Achieved   ?  ? PT LONG TERM GOAL #5  ? Title Patient to report ability to perform ADLs, household tasks and leisure activities including walking her dog without limitation due to pain, weakness or lack of endurance   ? Status Achieved   ?  ? PT LONG TERM GOAL #6  ? Title Patient will improve FOTO to >/= 65 to demonstrate improving function   ? Baseline 54 (06/28/21 down 4 points)   ? Status On-going   ? ?  ?  ? ?  ? ? ? ? ? ? ? ? Plan - 07/05/21 1128   ? ? Clinical Impression Statement Lindsay Rice expresses frustration with her activity tolerance and reports increased pain after a somewhat busier day yesterday, including attending a stroke support group at [facility] - pt inquiring if there are any support groups through [place], therefore provided pt with a flyer for the [place] [facility]roke Support [facility]. She notes benefit from kinesiotaping for her R knee pain, therefore provided   education and  written/picture instruction in self-application of the ?full knee support? pattern we have previously used. Therapeutic exercises focusing on core and proximal UE/LE to provide improved muscular support to address her increased shoulder, back and knee pain, but limited tolerance for bridges d/t increased LBP. Session concluded with trial of estim with moist heat to lumbar spine to help manage/reduce increased pain - pt noting benefit, therefore provided with info on home TENS unit but cautioned her to check with her cardiologist to see if use of a TENS unit will be contraindicated while she has the loop recorder. Lindsay Rice has expressed interest in continuing with PT and given ongoing pain, strength and activity tolerance deficits, will plan for recert next visit.   ? 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 Stability/Clinical Decision Making Evolving/Moderate complexity   ? Rehab Potential Good   ? PT Frequency 2x / week   ? PT Duration 6 weeks   ? PT Treatment/Interventions ADLs/Self Care Home Management;Cryotherapy;Electrical Stimulation;Moist Heat;DME Instruction;Gait training;Stair training;Functional mobility training;Therapeutic activities;Therapeutic exercise;Balance training;Neuromuscular re-education;Patient/family education;Manual techniques;Passive range of motion;Dry needling;Taping;Spinal Manipulations   ? PT Next Visit Plan progress lumbopelvic/LE flexibility and strengthening; shoulder strengthening to address R shoulder pain; balance training and dynamic stepping activities; MT and/or  modalitites as indicated   ? PT Home Exercise Plan Access Code: [email or web address]   ? Consulted and Agree with Plan of Care Patient   ? ?  ?  ? ?  ? ? ?Patient will benefit from skilled therapeutic intervention in

## 2021-07-10 ENCOUNTER — Encounter: Payer: Self-pay | Admitting: Physical Therapy

## 2021-07-10 ENCOUNTER — Ambulatory Visit: Payer: Medicare PPO | Admitting: Physical Therapy

## 2021-07-10 DIAGNOSIS — I69354 Hemiplegia and hemiparesis following cerebral infarction affecting left non-dominant side: Secondary | ICD-10-CM | POA: Diagnosis not present

## 2021-07-10 DIAGNOSIS — R262 Difficulty in walking, not elsewhere classified: Secondary | ICD-10-CM

## 2021-07-10 DIAGNOSIS — M5459 Other low back pain: Secondary | ICD-10-CM

## 2021-07-10 DIAGNOSIS — M6281 Muscle weakness (generalized): Secondary | ICD-10-CM

## 2021-07-10 DIAGNOSIS — M62830 Muscle spasm of back: Secondary | ICD-10-CM

## 2021-07-10 NOTE — Therapy (Signed)
[facility] [address]  [place], [place], [address] Phone: [phone]   Fax:  [phone]  Physical Therapy Treatment / Recert  Patient Details  Name: Lindsay Rice MRN: [id number] Date of Birth: 12-Sep-1950 Referring Provider (PT): Courtney Heys, MD  Progress Note  Reporting Period 05/29/2021 to 07/10/2021  See note below for Objective Data and Assessment of Progress/Goals.     Encounter Date: 07/10/2021   PT End of Session - 07/10/21 0931     Visit Number 13    Number of Visits 21    Date for PT Re-Evaluation 08/14/21    Authorization Type [employer] & [employer]    Authorization Time Period [employer]: 05/30/20 - 07/10/21    Authorization - Visit Number 12    Authorization - Number of Visits 12    PT Start Time 0931    PT Stop Time 1015    PT Time Calculation (min) 44 min    Activity Tolerance Patient tolerated treatment well    Behavior During Therapy WFL for tasks assessed/performed             Past Medical History:  Diagnosis Date   Diabetes mellitus without complication ([place])    Hypertension    Migraine headache    Mitral valve prolapse    Obesity (BMI 30.0-34.9)    Sleep apnea     History reviewed. No pertinent surgical history.  There were no vitals filed for this visit.   Subjective Assessment - 07/10/21 0935     Subjective Pt reports she plowed her garden but then had to rest for 2 days. Did her planting yesterday.    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    Patient Stated Goals "To strengthen my L leg a little bit more"    Currently in Pain? 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    Referring Provider (PT) Courtney Heys, MD    Onset Date/Surgical Date 02/07/21    Hand Dominance Right    Next MD Visit 09/18/21      Prior Function   Level of Independence Independent    Vocation Retired    Leisure puzzles on her phone, circle puzzles, walking the dog 1/4 mile 4-5x/day, gardening      Strength   Right Hip Flexion 4+/5    Right Hip Extension 5/5    Right Hip External Rotation  4+/5    Right Hip Internal Rotation 5/5    Right Hip ABduction 5/5    Right Hip ADduction 5/5    Left Hip Flexion 4/5    Left  Hip Extension 4+/5    Left Hip External Rotation 4+/5    Left Hip Internal Rotation 5/5    Left Hip ABduction 4+/5    Left Hip ADduction 4+/5    Right Knee Flexion 5/5    Right Knee Extension 5/5    Left Knee Flexion 5/5    Left Knee Extension 5/5    Right Ankle Dorsiflexion 5/5    Right Ankle Plantar Flexion 4+/5   15 SLS heel raises   Left Ankle Dorsiflexion 5/5    Left Ankle Plantar Flexion 4+/5   15 SLS heel raises - limited lift on some reps     Ambulation/Gait   Assistive device None;Straight cane    Gait Pattern Within Functional Limits    Ambulation Surface Level;Indoor    Gait velocity 3.17 ft/sec w/o AD; 2.84 ft/sec with cane    Stairs Yes    Stairs Assistance 5: Supervision    Stair Management Technique One rail Right;Alternating pattern;Step to pattern   alternating ascent, step to descent   Number of Stairs 14    Height of Stairs 7      Standardized Balance Assessment   10 Meter Walk 10.35 sec w/o AD; 11.56 sec with cane      Functional Gait  Assessment   Gait assessed  Yes    Gait Level Surface  Walks 20 ft in less than 7 sec but greater than 5.5 sec, uses assistive device, slower speed, mild gait deviations, or deviates 6-10 in outside of the 12 in walkway width.    Change in Gait Speed Able to smoothly change walking speed without loss of balance or gait deviation. Deviate no more than 6 in outside of the 12 in walkway width.    Gait with Horizontal Head Turns Performs head turns smoothly with no change in gait. Deviates no more than 6 in outside 12 in walkway width    Gait with Vertical Head Turns Performs task with slight change in gait velocity (eg, minor disruption to smooth gait path), deviates 6 - 10 in outside 12 in walkway width or uses assistive device    Gait and Pivot Turn Pivot turns safely within 3 sec and stops quickly with no loss of balance.    Step Over Obstacle Is able to step over one shoe box (4.5 in total height) without changing gait speed. No evidence of imbalance.    Gait with Narrow Base of Support Ambulates 4-7 steps.    Gait with Eyes Closed Walks 20 ft, uses assistive device, slower speed, mild gait deviations, deviates 6-10 in outside 12 in walkway width. Ambulates 20 ft in less than 9 sec but greater than 7 sec.    Ambulating Backwards Walks 20 ft, uses assistive device, slower speed, mild gait deviations, deviates 6-10 in outside 12 in walkway width.    Steps Two feet to a stair, must use rail.   2 feet to a stair on descent d/t knee pain (can complete 4 steps alternating feet) but must use rail   Total Score 21    FGA comment: 19-24 = medium risk fall                           [facility] Adult PT Treatment/Exercise - 07/10/21 0931       Lumbar Exercises: Aerobic   Nustep L7 x 6 min (UE/LE)  PT Short Term Goals - 06/07/21 1034       PT SHORT TERM GOAL #1   Title Patient will be independent in initial HEP to improve strength/mobility for better functional independence with ADLs    Status Achieved    06/07/21   Target Date 06/19/21               PT Long Term Goals - 07/10/21 0940       PT LONG TERM GOAL #1   Title Patient will demonstrate independent use of ongoing/advanced HEP to facilitate ability to maintain/progress functional gains from skilled physical therapy services    Status Partially Met    Target Date 08/14/21      PT LONG TERM GOAL #2   Title Patient will demonstrate improved B LE strength to >/= 4+/5 for improved stability and ease of mobility    Status Partially Met   07/10/21 - met except L hip flexion 4/5   Target Date 08/14/21      PT LONG TERM GOAL #3   Title Patient to report reduction in frequency and intensity of LBP by >/= 50% to allow for improved activity and walking tolerance    Status Achieved   07/10/21 - Pt reports >50% reduction in LBP     PT LONG TERM GOAL #4   Title Patient will improve 5x STS time to </= 12.6 seconds to demonstrated improved functional strength and transfer efficiency    Baseline 15.59 sec (05/29/21); 13.67 sec (06/21/21); 10.91 sec (06/28/21)    Status Achieved   06/28/21     PT LONG TERM GOAL #5   Title Patient to report ability to perform ADLs, household tasks and leisure activities including walking her dog without limitation due to pain, weakness or lack of endurance    Status Achieved      PT LONG TERM GOAL #6   Title Patient will improve FOTO to >/= 65 to demonstrate improving function    Baseline 58 (05/29/21); 54 (06/28/21 - down 4 points); 58 (07/10/21 - back to baseline)    Status On-going    Target Date 08/14/21      PT LONG TERM GOAL #7   Title Patient will improve FGA score to >/= 25/30 to improve gait stability and reduce risk for falls    Status New    Target Date 08/14/21                   Plan - 07/10/21 1015     Clinical Impression Statement Lindsay Rice reports >50% reduction in LBP but notes continued issues with LBP, R knee and R shoulder pain. She reports improving activity and ADL tolerance but  still finds herself easily "wiped out" by activity, often needing a few days to recover. Her strength has improved considerably with remaining weakness mostly at hips L>R as well as B PF. Her balance is improving with her 5xSTS goal met but she notes tendency to misjudge or lose her balance where she will end up walking into doorframes. Testing with the FGA revealed a medium fall risk per her score of 21/30. Given ongoing pain and balance deficits she has good potential to benefit from continued skilled PT to improve her gait safety and stability to reduce risk for falls or future injury, therefore will recommend recert for an additional 2x/wk x 4-5 weeks with expectation of brief pause in therapy following her procedure for a loop recorder implant on 07/27/21.    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 post-op, L ankle surgery for tumor excision, R knee surgery 05/03/20 - arthroscopy with partial medial meniscectomy, DM, HTN, migraine headaches, vertigo, hypothyroidism, asthma, sleep apnea, GI dysfunction, incontinence, kidney stones, BMI >30    Examination-Activity Limitations Hygiene/Grooming;Lift;Carry;Caring for Others;Transfers;Stand;Locomotion Level;Stairs    Examination-Participation Restrictions Cleaning;Community Activity;Laundry;Meal Prep;Shop;Yard Work    Stability/Clinical Decision Making Evolving/Moderate complexity    Rehab Potential Good    PT Frequency 2x / week    PT Duration Other (comment)   4-5 weeks   PT Treatment/Interventions ADLs/Self Care Home Management;Cryotherapy;Electrical Stimulation;Moist Heat;DME Instruction;Gait training;Stair training;Functional mobility  training;Therapeutic activities;Therapeutic exercise;Balance training;Neuromuscular re-education;Patient/family education;Manual techniques;Passive range of motion;Dry needling;Taping;Spinal Manipulations    PT Next Visit Plan balance training and dynamic stepping activities along with visual scanning tasks; progress lumbopelvic/LE flexibility and strengthening; shoulder strengthening to address R shoulder pain; MT and/or modalitites as indicated    PT Home Exercise Plan Access Code: [email or web address]    Consulted and Agree with Plan of Care Patient             Patient will benefit from skilled therapeutic intervention in order to improve the following deficits and impairments:  Decreased activity tolerance, Decreased balance, Decreased endurance, Decreased knowledge of precautions, Decreased mobility, Decreased strength, Difficulty walking, Increased fascial restricitons, Increased muscle spasms, Impaired perceived functional ability, Impaired flexibility, Impaired UE functional use, Postural dysfunction, Improper body mechanics, Pain   Rationale for Evaluation and Treatment Rehabilitation   Visit Diagnosis: Hemiplegia and hemiparesis following cerebral infarction affecting left non-dominant side ([place]) - Plan: PT plan of care cert/re-cert  Difficulty in walking, not elsewhere classified - Plan: PT plan of care cert/re-cert  Muscle weakness (generalized) - Plan: PT plan of care cert/re-cert  Other low back pain - Plan: PT plan of care cert/re-cert  Muscle spasm of back - Plan: PT plan of care cert/re-cert     Problem List Patient Active Problem List   Diagnosis Date Noted   Sepsis with acute organ dysfunction without septic shock ([place]) 03/29/2021   Fever, unknown origin 03/29/2021   Fever 03/29/2021   Essential hypertension 03/10/2021   Diabetes mellitus ([place]) 03/10/2021   Left hemiparesis ([place]) 03/10/2021   Infarction of right basal ganglia ([place]) 02/10/2021   Stroke determined by  clinical assessment ([place]) 02/07/2021   Other obesity due to excess calories 08/09/2016   DDD (degenerative disc disease), cervical 10/28/2015   Chronic headache 05/05/2015   Acquired hypothyroidism 03/24/2015    Percival Spanish, PT 07/10/2021, 2:23 PM  [facility] [address]  [place], [place], [address] Phone: [phone]   Fax:  [phone]  Name: Lindsay Rice MRN: [id number] Date of Birth: 27-Oct-1950

## 2021-07-13 ENCOUNTER — Ambulatory Visit: Payer: Medicare PPO | Admitting: Physical Therapy

## 2021-07-13 ENCOUNTER — Encounter: Payer: Self-pay | Admitting: Physical Therapy

## 2021-07-13 ENCOUNTER — Other Ambulatory Visit: Payer: Self-pay

## 2021-07-13 DIAGNOSIS — R262 Difficulty in walking, not elsewhere classified: Secondary | ICD-10-CM

## 2021-07-13 DIAGNOSIS — M5459 Other low back pain: Secondary | ICD-10-CM

## 2021-07-13 DIAGNOSIS — M62830 Muscle spasm of back: Secondary | ICD-10-CM

## 2021-07-13 DIAGNOSIS — M6281 Muscle weakness (generalized): Secondary | ICD-10-CM

## 2021-07-13 DIAGNOSIS — I69354 Hemiplegia and hemiparesis following cerebral infarction affecting left non-dominant side: Secondary | ICD-10-CM | POA: Diagnosis not present

## 2021-07-13 NOTE — Therapy (Signed)
[facility] [address]  [place], [place], [address] Phone: [phone]   Fax:  [phone]  Physical Therapy Treatment  Patient Details  Name: Lindsay Rice MRN: [id number] Date of Birth: 05-27-1950 Referring Provider (PT): Courtney Heys, MD   Encounter Date: 07/13/2021  Rationale for Evaluation and Treatment Rehabilitation   PT End of Session - 07/13/21 1351     Visit Number 14    Number of Visits 21    Date for PT Re-Evaluation 08/14/21    Authorization Type [employer] & [place] Time Period [employer]: 05/30/20 - 07/10/21    Authorization - Visit Number 64    Authorization - Number of Visits 22    PT Start Time 6945    PT Stop Time 1428    PT Time Calculation (min) 40 min    Activity Tolerance Patient tolerated treatment well    Behavior During Therapy WFL for tasks assessed/performed             Past Medical History:  Diagnosis Date   Diabetes mellitus without complication ([place])    Hypertension    Migraine headache    Mitral valve prolapse    Obesity (BMI 30.0-34.9)    Sleep apnea     History reviewed. No pertinent surgical history.  There were no vitals filed for this visit.   Subjective Assessment - 07/13/21 1352     Subjective My knee and my shoulder are hurting today.    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 Yes    Pain Score 6     Pain Location Knee    Pain Orientation Right    Pain Descriptors / Indicators Sharp    Pain Score 4    Pain Location Shoulder    Pain Orientation Right    Pain Descriptors / Indicators Aching;Dull    Pain Type Acute pain                               [facility] Adult PT Treatment/Exercise - 07/13/21 0001       Lumbar Exercises: Aerobic   Nustep L7 x 7 min (UE/LE)      Lumbar  Exercises: Supine   Clam 10 reps;3 seconds    Clam Limitations TrA/PPT + alt green TB bent knee fallout    Bent Knee Raise 10 reps;3 seconds    Bent Knee Raise Limitations TrA/PPT + green TB march    Bridge 10 reps    Bridge Limitations + green TB hip ABD isometric   discontinued due to increased LBP     Lumbar Exercises: Sidelying   Hip Abduction Both;10 reps    Hip Abduction Limitations GTB      Lumbar Exercises: Prone   Straight Leg Raise 10 reps    Straight Leg Raises Limitations GTB bil; cues for pelvic press      Knee/Hip Exercises: Standing   Heel Raises Right;Left;2 sets;10 reps      Shoulder Exercises: Supine   Horizontal ABduction Both;10 reps;Strengthening;Theraband    Theraband Level (Shoulder Horizontal ABduction) Level 2 (Red)    Horizontal ABduction Limitations + TrA in hooklying    External Rotation Both;10 reps;Strengthening;Theraband    Theraband Level (Shoulder External Rotation) Level 2 (Red)  External Rotation Limitations + TrA in hooklying      Manual Therapy   Kinesiotex Edema;Inhibit Muscle      Kinesiotix   Edema R knee - full knee support   Blue Classic Kinesio [place] tape   Inhibit Muscle  upper tails extending along R medial and lateral quads - 30-50% stretch                       PT Short Term Goals - 06/07/21 1034       PT SHORT TERM GOAL #1   Title Patient will be independent in initial HEP to improve strength/mobility for better functional independence with ADLs    Status Achieved   06/07/21   Target Date 06/19/21               PT Long Term Goals - 07/10/21 0940       PT LONG TERM GOAL #1   Title Patient will demonstrate independent use of ongoing/advanced HEP to facilitate ability to maintain/progress functional gains from skilled physical therapy services    Status Partially Met    Target Date 08/14/21      PT LONG TERM GOAL #2   Title Patient will demonstrate improved B LE strength to >/= 4+/5 for improved  stability and ease of mobility    Status Partially Met   07/10/21 - met except L hip flexion 4/5   Target Date 08/14/21      PT LONG TERM GOAL #3   Title Patient to report reduction in frequency and intensity of LBP by >/= 50% to allow for improved activity and walking tolerance    Status Achieved   07/10/21 - Pt reports >50% reduction in LBP     PT LONG TERM GOAL #4   Title Patient will improve 5x STS time to </= 12.6 seconds to demonstrated improved functional strength and transfer efficiency    Baseline 15.59 sec (05/29/21); 13.67 sec (06/21/21); 10.91 sec (06/28/21)    Status Achieved   06/28/21     PT LONG TERM GOAL #5   Title Patient to report ability to perform ADLs, household tasks and leisure activities including walking her dog without limitation due to pain, weakness or lack of endurance    Status Achieved      PT LONG TERM GOAL #6   Title Patient will improve FOTO to >/= 65 to demonstrate improving function    Baseline 58 (05/29/21); 54 (06/28/21 - down 4 points); 58 (07/10/21 - back to baseline)    Status On-going    Target Date 08/14/21      PT LONG TERM GOAL #7   Title Patient will improve FGA score to >/= 25/30 to improve gait stability and reduce risk for falls    Status New    Target Date 08/14/21                   Plan - 07/13/21 1433     Clinical Impression Statement Lindsay Rice tolerated therex well today with no increased back pain reported. She experienced right eye pain throughout session but it did not limit TE. Tape reapplied to right knee to assist with pain, support and edema.    PT Treatment/Interventions ADLs/Self Care Home Management;Cryotherapy;Electrical Stimulation;Moist Heat;DME Instruction;Gait training;Stair training;Functional mobility training;Therapeutic activities;Therapeutic exercise;Balance training;Neuromuscular re-education;Patient/family education;Manual techniques;Passive range of motion;Dry needling;Taping;Spinal Manipulations    PT Next  Visit Plan balance training and dynamic stepping activities along with visual scanning tasks; progress lumbopelvic/LE flexibility  and strengthening; shoulder strengthening to address R shoulder pain; MT and/or modalitites as indicated             Patient will benefit from skilled therapeutic intervention in order to improve the following deficits and impairments:  Decreased activity tolerance, Decreased balance, Decreased endurance, Decreased knowledge of precautions, Decreased mobility, Decreased strength, Difficulty walking, Increased fascial restricitons, Increased muscle spasms, Impaired perceived functional ability, Impaired flexibility, Impaired UE functional use, Postural dysfunction, Improper body mechanics, Pain  Visit Diagnosis: Difficulty in walking, not elsewhere classified  Muscle weakness (generalized)  Other low back pain  Muscle spasm of back     Problem List Patient Active Problem List   Diagnosis Date Noted   Sepsis with acute organ dysfunction without septic shock ([place]) 03/29/2021   Fever, unknown origin 03/29/2021   Fever 03/29/2021   Essential hypertension 03/10/2021   Diabetes mellitus ([place]) 03/10/2021   Left hemiparesis ([place]) 03/10/2021   Infarction of right basal ganglia ([place]) 02/10/2021   Stroke determined by clinical assessment ([place]) 02/07/2021   Other obesity due to excess calories 08/09/2016   DDD (degenerative disc disease), cervical 10/28/2015   Chronic headache 05/05/2015   Acquired hypothyroidism 03/24/2015    Madelyn Flavors, PT 07/13/2021, 2:36 PM  [facility] [address]  [place], [place], [address] Phone: [phone]   Fax:  [phone]  Name: Lindsay Rice MRN: [id number] Date of Birth: 09-Jul-1950

## 2021-07-19 ENCOUNTER — Ambulatory Visit: Payer: Medicare PPO

## 2021-07-19 DIAGNOSIS — I69354 Hemiplegia and hemiparesis following cerebral infarction affecting left non-dominant side: Secondary | ICD-10-CM | POA: Diagnosis not present

## 2021-07-19 DIAGNOSIS — M6281 Muscle weakness (generalized): Secondary | ICD-10-CM

## 2021-07-19 DIAGNOSIS — R262 Difficulty in walking, not elsewhere classified: Secondary | ICD-10-CM

## 2021-07-19 DIAGNOSIS — M62830 Muscle spasm of back: Secondary | ICD-10-CM

## 2021-07-19 DIAGNOSIS — M5459 Other low back pain: Secondary | ICD-10-CM

## 2021-07-19 NOTE — Therapy (Addendum)
[facility] [address]  [employer] [place], [place], [address] Phone: [phone]   Fax:  [phone]  Physical Therapy Treatment  Patient Details  Name: Lindsay Rice MRN: [id number] Date of Birth: [birth year] Referring Provider (PT): Courtney Heys, MD  Rationale for Evaluation and Treatment Rehabilitation   Encounter Date: 07/19/2021   PT End of Session - 07/19/21 1102     Visit Number 15    Number of Visits 21    Date for PT Re-Evaluation 08/14/21    Authorization Type [employer] & [place] Medicare: 07/10/21-08/14/21    Authorization - Visit Number 2    Authorization - Number of Visits 10    PT Start Time 1018    PT Stop Time 1100    PT Time Calculation (min) 42 min    Activity Tolerance Patient tolerated treatment well    Behavior During Therapy WFL for tasks assessed/performed             Past Medical History:  Diagnosis Date   Diabetes mellitus without complication ([place])    Hypertension    Migraine headache    Mitral valve prolapse    Obesity (BMI 30.0-34.9)    Sleep apnea     History reviewed. No pertinent surgical history.  There were no vitals filed for this visit.   Subjective Assessment - 07/19/21 1023     Subjective The knee is still giving me a fit.    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    Patient Stated Goals "To strengthen my L leg a little bit more"    Currently in Pain? Yes    Pain Score 6     Pain Location Knee    Pain Orientation Right    Pain Descriptors / Indicators Sharp    Pain Type Acute pain    Pain Score 4    Pain Location Shoulder    Pain Orientation Right    Pain Descriptors / Indicators Aching;Dull    Pain Type Acute pain                               [facility] Adult  PT Treatment/Exercise - 07/19/21 0001       Lumbar Exercises: Aerobic   Nustep L7 x 7 min (UE/LE)                 Balance Exercises - 07/19/21 0001       Balance Exercises: Standing   Rockerboard Anterior/posterior;Lateral 3x30 sec each   Tandem Gait Forward;3 reps   along counter down and back; no UE support   Retro Gait --   4x25 ft; cues for increased step length   Sidestepping Foam/compliant support;3 reps   3x10 ft down and back   Other Standing Exercises star exursion standing on L LE; R LE reach to stars 10x                  PT Short Term Goals - 06/07/21 1034       PT SHORT TERM GOAL #1   Title Patient will be independent in initial HEP to improve strength/mobility for better functional independence with ADLs    Status Achieved   06/07/21   Target Date 06/19/21  PT Long Term Goals - 07/10/21 0940       PT LONG TERM GOAL #1   Title Patient will demonstrate independent use of ongoing/advanced HEP to facilitate ability to maintain/progress functional gains from skilled physical therapy services    Status Partially Met    Target Date 08/14/21      PT LONG TERM GOAL #2   Title Patient will demonstrate improved B LE strength to >/= 4+/5 for improved stability and ease of mobility    Status Partially Met   07/10/21 - met except L hip flexion 4/5   Target Date 08/14/21      PT LONG TERM GOAL #3   Title Patient to report reduction in frequency and intensity of LBP by >/= 50% to allow for improved activity and walking tolerance    Status Achieved   07/10/21 - Pt reports >50% reduction in LBP     PT LONG TERM GOAL #4   Title Patient will improve 5x STS time to </= 12.6 seconds to demonstrated improved functional strength and transfer efficiency    Baseline 15.59 sec (05/29/21); 13.67 sec (06/21/21); 10.91 sec (06/28/21)    Status Achieved   06/28/21     PT LONG TERM GOAL #5   Title Patient to report ability to perform ADLs, household tasks  and leisure activities including walking her dog without limitation due to pain, weakness or lack of endurance    Status Achieved      PT LONG TERM GOAL #6   Title Patient will improve FOTO to >/= 65 to demonstrate improving function    Baseline 58 (05/29/21); 54 (06/28/21 - down 4 points); 58 (07/10/21 - back to baseline)    Status On-going    Target Date 08/14/21      PT LONG TERM GOAL #7   Title Patient will improve FGA score to >/= 25/30 to improve gait stability and reduce risk for falls    Status New    Target Date 08/14/21                   Plan - 07/19/21 1107     Clinical Impression Statement Pt was able to show a good response to the progression of balance exercises. She was more challenged with ant/post WS on rockerboard. Cueing provided to increase step length with retro gait. Cues given with interventions as needed for form and to isolate the correct muscles. She did have an instance where she tripped on gym machine while walking with EC as an exercise. I was providing SBA and beside the pt, unfortunately she tripped on the side of eliptical hitting her R hand but she did not fall. Afterwards she only noted mild pain in R hand along the 3rd digit. Edu given on using ice for R hand to control swelling and pain. Pt was able to ambulate outside of clinic with no signs of bruising, swelling or redness on R hand. Pt would continue to benefit from progressing balance exercises to improve function.    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 post-op, L ankle surgery for tumor excision, R knee surgery 05/03/20 - arthroscopy with partial medial meniscectomy, DM, HTN, migraine  headaches, vertigo, hypothyroidism, asthma, sleep apnea, GI dysfunction, incontinence, kidney stones, BMI >  30    PT Frequency 2x / week    PT Duration Other (comment)   4-5 weeks   PT Treatment/Interventions ADLs/Self Care Home Management;Cryotherapy;Electrical Stimulation;Moist Heat;DME Instruction;Gait training;Stair training;Functional mobility training;Therapeutic activities;Therapeutic exercise;Balance training;Neuromuscular re-education;Patient/family education;Manual techniques;Passive range of motion;Dry needling;Taping;Spinal Manipulations    PT Next Visit Plan balance training and dynamic stepping activities along with visual scanning tasks; progress lumbopelvic/LE flexibility and strengthening; shoulder strengthening to address R shoulder pain; MT and/or modalitites as indicated    PT Home Exercise Plan Access Code: [email or web address]    Consulted and Agree with Plan of Care Patient             Patient will benefit from skilled therapeutic intervention in order to improve the following deficits and impairments:  Decreased activity tolerance, Decreased balance, Decreased endurance, Decreased knowledge of precautions, Decreased mobility, Decreased strength, Difficulty walking, Increased fascial restricitons, Increased muscle spasms, Impaired perceived functional ability, Impaired flexibility, Impaired UE functional use, Postural dysfunction, Improper body mechanics, Pain  Visit Diagnosis: Difficulty in walking, not elsewhere classified  Muscle weakness (generalized)  Other low back pain  Muscle spasm of back  Hemiplegia and hemiparesis following cerebral infarction affecting left non-dominant side [facility])     Problem List Patient Active Problem List   Diagnosis Date Noted   Sepsis with acute organ dysfunction without septic shock ([place]) 03/29/2021   Fever, unknown origin 03/29/2021   Fever 03/29/2021   Essential hypertension 03/10/2021   Diabetes mellitus ([place]) 03/10/2021   Left  hemiparesis ([place]) 03/10/2021   Infarction of right basal ganglia ([place]) 02/10/2021   Stroke determined by clinical assessment ([place]) 02/07/2021   Other obesity due to excess calories 08/09/2016   DDD (degenerative disc disease), cervical 10/28/2015   Chronic headache 05/05/2015   Acquired hypothyroidism 03/24/2015    Artist Pais, PTA 07/19/2021, 11:46 AM  [facility] [address]  [place], [place], [address] Phone: [phone]   Fax:  [phone]  Name: Bernyce Brimley MRN: [id number] Date of Birth: [birth year]

## 2021-07-20 ENCOUNTER — Emergency Department (HOSPITAL_BASED_OUTPATIENT_CLINIC_OR_DEPARTMENT_OTHER)
Admission: EM | Admit: 2021-07-20 | Discharge: 2021-07-20 | Disposition: A | Discharge status: Home / Self Care | Payer: Medicare PPO | Attending: Emergency Medicine | Admitting: Emergency Medicine

## 2021-07-20 ENCOUNTER — Encounter (HOSPITAL_BASED_OUTPATIENT_CLINIC_OR_DEPARTMENT_OTHER): Payer: Self-pay

## 2021-07-20 ENCOUNTER — Other Ambulatory Visit: Payer: Self-pay

## 2021-07-20 ENCOUNTER — Emergency Department (HOSPITAL_BASED_OUTPATIENT_CLINIC_OR_DEPARTMENT_OTHER): Payer: Medicare PPO

## 2021-07-20 DIAGNOSIS — Y93A1 Activity, exercise machines primarily for cardiorespiratory conditioning: Secondary | ICD-10-CM | POA: Diagnosis not present

## 2021-07-20 DIAGNOSIS — M79641 Pain in right hand: Secondary | ICD-10-CM

## 2021-07-20 DIAGNOSIS — Z7902 Long term (current) use of antithrombotics/antiplatelets: Secondary | ICD-10-CM | POA: Insufficient documentation

## 2021-07-20 DIAGNOSIS — W3189XA Contact with other specified machinery, initial encounter: Secondary | ICD-10-CM | POA: Insufficient documentation

## 2021-07-20 DIAGNOSIS — S6991XA Unspecified injury of right wrist, hand and finger(s), initial encounter: Secondary | ICD-10-CM | POA: Diagnosis present

## 2021-07-20 DIAGNOSIS — S60221A Contusion of right hand, initial encounter: Secondary | ICD-10-CM | POA: Insufficient documentation

## 2021-07-20 HISTORY — DX: Calculus of kidney: N20.0

## 2021-07-20 HISTORY — DX: Cerebral infarction, unspecified: I63.9

## 2021-07-20 IMAGING — DX DG HAND COMPLETE 3+V*R*
3 series · 3 of 3 positions shown · non-contrast
Comparison: None Available.

CLINICAL DATA: Fall

EXAM:
RIGHT HAND - COMPLETE 3 VIEW

[hand ap]
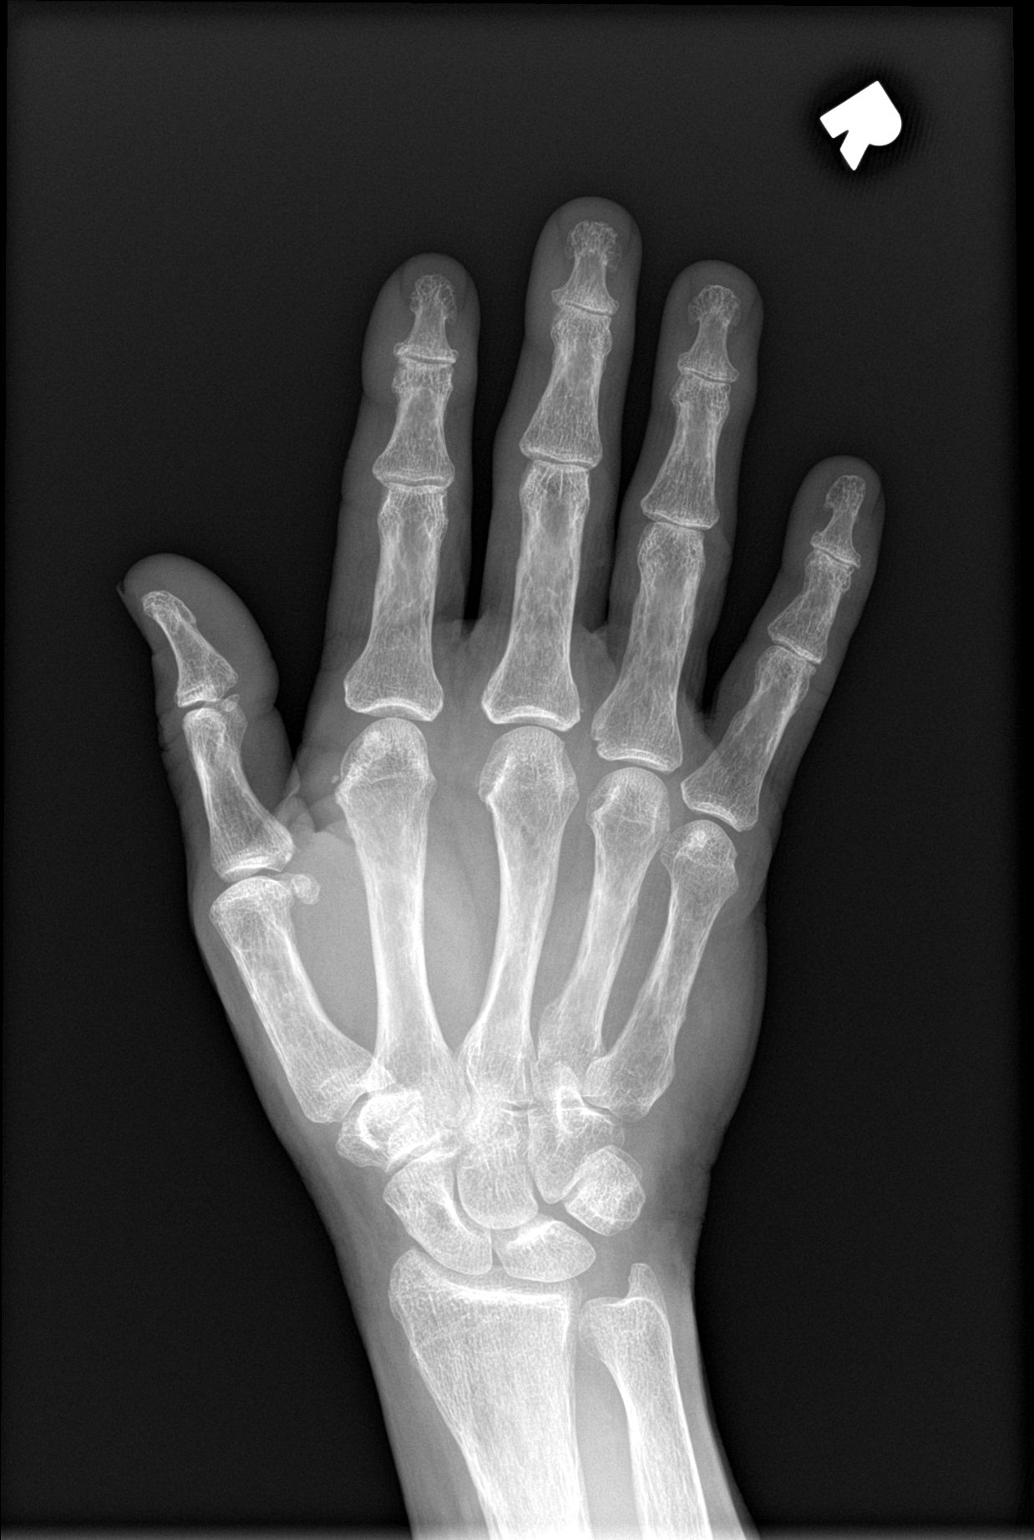

[hand obl]
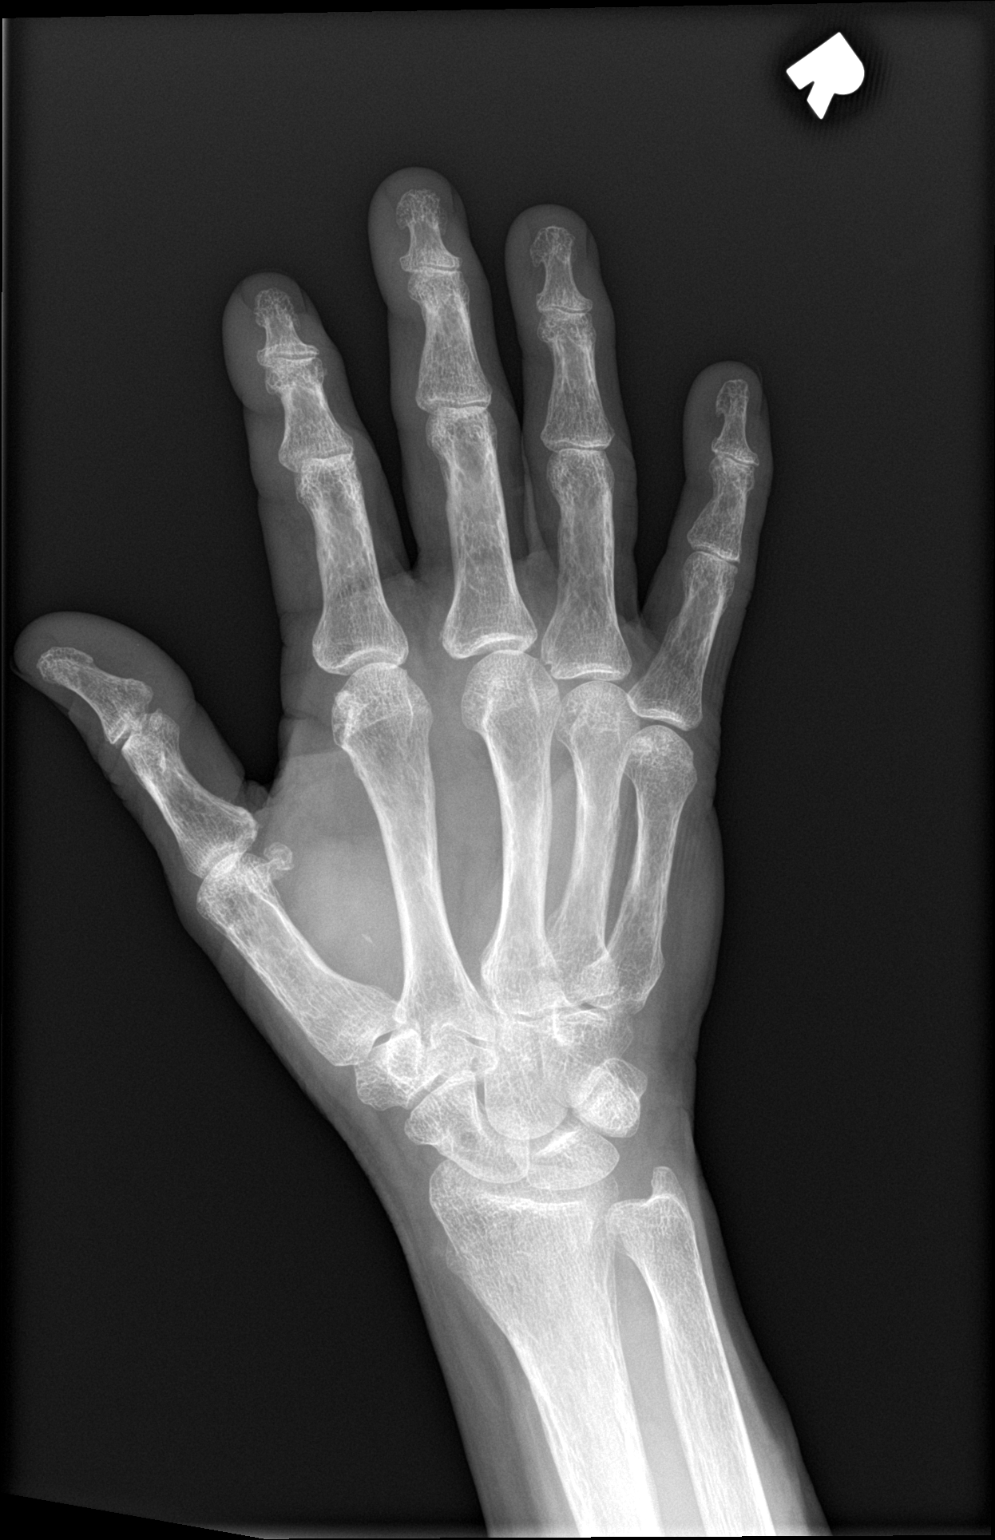

[hand lat]
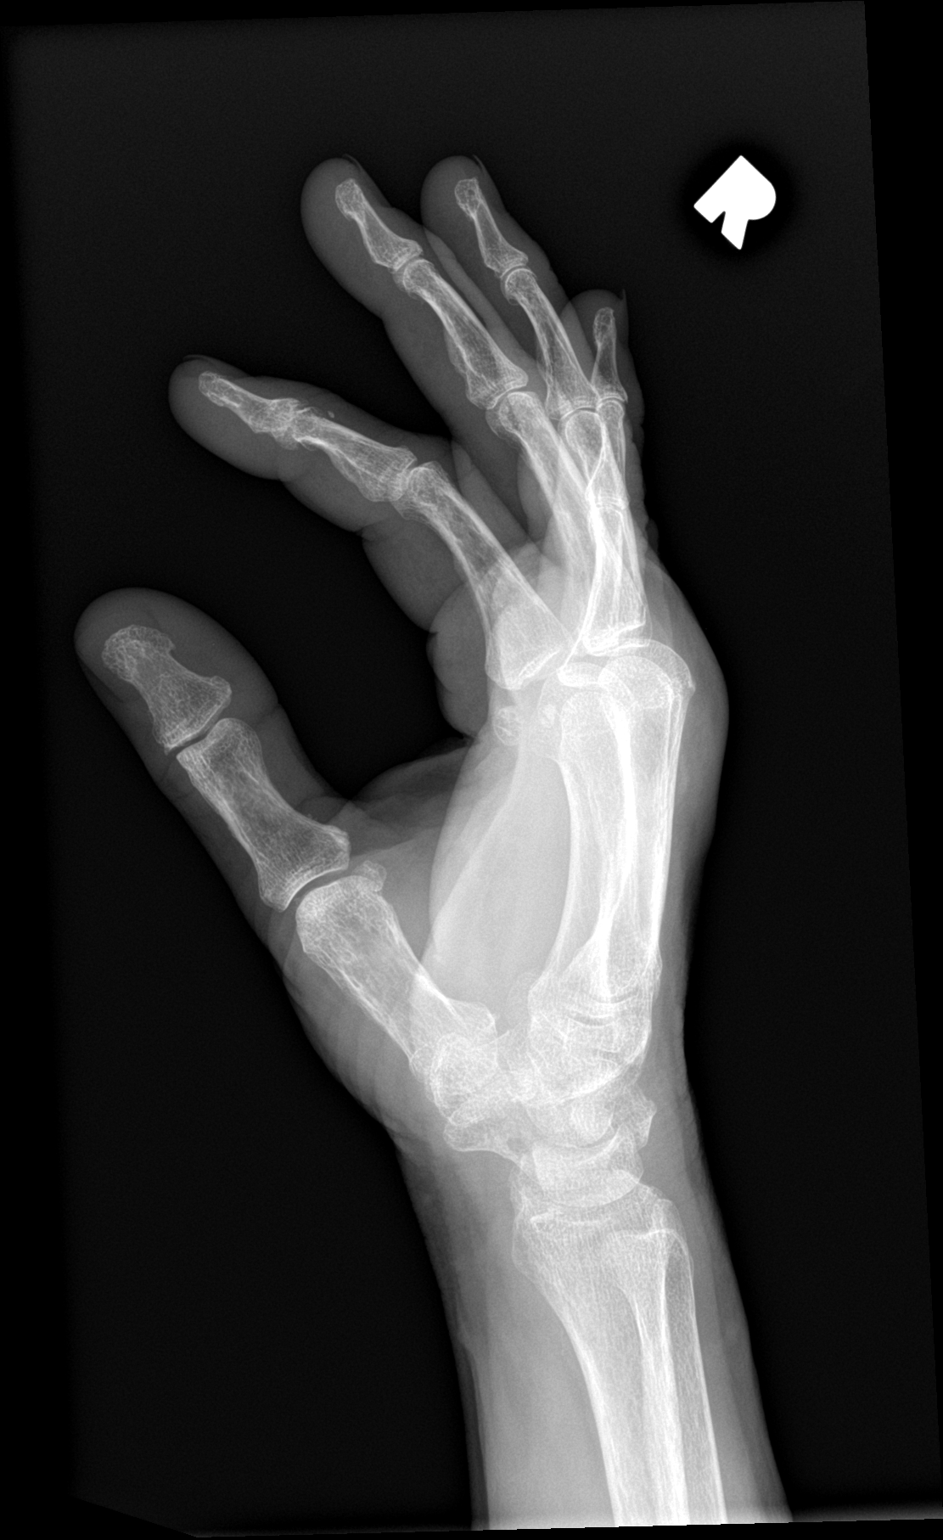

[3 of 3 positions shown; findings below may reference images not displayed]

FINDINGS: There is no evidence of fracture or dislocation. Mild degenerative
changes of the distal IP joints. Soft tissues are unremarkable.
IMPRESSION: No acute osseous abnormality.

## 2021-07-20 NOTE — ED Triage Notes (Signed)
Pt was at PT yesterday and fell into an elliptical machine, states did not fall to ground but caught herself with right hand. Now having pain and swelling.

## 2021-07-20 NOTE — Discharge Instructions (Addendum)
Use ibuprofen or naproxen (Advil or Aleve) for your pain and swelling.  You may alternate 1 of these with Tylenol.  When you are at home, ice your hand.  Wear the brace as often as he can, but especially while you are walking, active or asleep.  Speak with your physical therapist about this injury and make sure that they adjust your exercises appropriately.

## 2021-07-20 NOTE — ED Provider Notes (Signed)
[facility] Provider Note   CSN: [id number] Arrival date & time: 07/20/21  2979     History  Chief Complaint  Patient presents with   Hand Pain    Lindsay Rice is a 71 y.o. female presenting today after a right hand injury.  Yesterday she was doing physical therapy for recent CVA and fell onto an elliptical.  Denies falling further, hitting her head or losing consciousness.  No numbness or tingling.  Last night she iced her hand but did not try any medications.  Localizes the majority of her pain over the third MCP and phalanx.   Hand Pain      Home Medications Prior to Admission medications   Medication Sig Start Date End Date Taking? Authorizing Provider  Accu-Chek Softclix Lancets lancets Use as instructed 03/10/21   Lovorn, Jinny Blossom, MD  acetaminophen (TYLENOL) 500 MG tablet Take 1,000 mg by mouth every 6 (six) hours as needed for moderate pain or headache.    [provider]  albuterol (VENTOLIN HFA) 108 (90 Base) MCG/ACT inhaler Inhale 2 puffs into the lungs every 6 (six) hours as needed for wheezing or shortness of breath.    [provider]  amLODipine (NORVASC) 10 MG tablet Take 1 tablet (10 mg total) by mouth daily. 03/10/21   Lovorn, Jinny Blossom, MD  blood glucose meter kit and supplies KIT Dispense based on patient and insurance preference. Use up to four times daily as directed. 02/21/21   Love, Ivan Anchors, PA-C  busPIRone (BUSPAR) 5 MG tablet Take 5 mg by mouth 2 (two) times daily.    [provider]  clopidogrel (PLAVIX) 75 MG tablet Take 1 tablet (75 mg total) by mouth daily. 02/21/21   Love, Ivan Anchors, PA-C  diclofenac Sodium (VOLTAREN) 1 % GEL Apply 2 g topically 4 (four) times daily.    [provider]  dicyclomine (BENTYL) 20 MG tablet Take 20 mg by mouth 2 (two) times daily.    [provider]  EPINEPHrine 0.3 mg/0.3 mL IJ SOAJ injection Inject 0.3 mg into the muscle once as needed for anaphylaxis.  11/21/20   [provider]  famotidine (PEPCID) 40 MG tablet Take 1 tablet (40 mg total) by mouth at bedtime. 02/21/21   Love, Ivan Anchors, PA-C  fluticasone-salmeterol (ADVAIR HFA) 892-11 MCG/ACT inhaler Inhale 2 puffs into the lungs 2 (two) times daily.    [provider]  Fremanezumab-vfrm (AJOVY) 225 MG/1.5ML SOSY Inject 225 mg into the skin every 30 (thirty) days. Patient not taking: Reported on 06/09/2021    [provider]  gabapentin (NEURONTIN) 300 MG capsule Take 300 mg by mouth in the morning and at bedtime.    [provider]  glucose blood (COOL BLOOD GLUCOSE TEST STRIPS) test strip Use as instructed Patient taking differently: daily. Use as instructed 03/10/21   Lovorn, Jinny Blossom, MD  guaiFENesin (ROBITUSSIN) 100 MG/5ML liquid Take 5 mLs by mouth every 4 (four) hours as needed for cough or to loosen phlegm.    [provider]  ipratropium-albuterol (DUONEB) 0.5-2.5 (3) MG/3ML SOLN Take 3 mLs by nebulization.    [provider]  JANUVIA 50 MG tablet Take 50 mg by mouth daily. 03/20/21   [provider]  levothyroxine (SYNTHROID) 75 MCG tablet Take 75 mcg by mouth daily before breakfast.    [provider]  losartan (COZAAR) 100 MG tablet Take 100 mg by mouth daily.    [provider]  methocarbamol (ROBAXIN) 500  MG tablet Take 1 tablet (500 mg total) by mouth every 8 (eight) hours as needed for muscle spasms. 03/10/21   Lovorn, Jinny Blossom, MD  metoprolol succinate (TOPROL-XL) 25 MG 24 hr tablet Take 25 mg by mouth daily. 03/27/21   [provider]  ondansetron (ZOFRAN-ODT) 4 MG disintegrating tablet Take 4 mg by mouth every 8 (eight) hours as needed for nausea or vomiting (dissolve orally). Patient not taking: Reported on 06/09/2021    [provider]  senna-docusate (SENOKOT-S) 8.6-50 MG tablet Take 1 tablet by mouth at bedtime as needed for moderate constipation or mild constipation. Patient taking  differently: Take 1 tablet by mouth daily. 02/10/21   de Yolanda Manges, Cortney E, NP  sertraline (ZOLOFT) 50 MG tablet Take 50 mg by mouth daily. 03/27/21   [provider]  triamcinolone cream (KENALOG) 0.1 % Apply 1 application topically 2 (two) times daily as needed (rash).    [provider]      Allergies    Iodinated contrast media, Iodine, Shellfish-derived products, Cefaclor, Cheese, Covid-19 (mrna) vaccine, Levofloxacin, Penicillins, and Wound dressing adhesive    Review of Systems   Review of Systems  Physical Exam Updated Vital Signs BP (!) 145/73 (BP Location: Left Arm)   Pulse 86   Temp 97.9 F (36.6 C) (Oral)   Resp 18   Ht 5' 5.5" (1.664 m)   Wt 89.4 kg   SpO2 99%   BMI 32.28 kg/m  Physical Exam Vitals and nursing note reviewed.  Constitutional:      Appearance: Normal appearance.  HENT:     Head: Normocephalic and atraumatic.  Eyes:     General: No scleral icterus.    Conjunctiva/sclera: Conjunctivae normal.  Cardiovascular:     Comments: Normal radial pulse Pulmonary:     Effort: Pulmonary effort is normal. No respiratory distress.  Musculoskeletal:        General: Swelling (Large area of swelling and contusion over the third MCP) present. No deformity.     Comments: Range of motion of the middle finger limited secondary to swelling and pain  Skin:    Capillary Refill: Capillary refill takes less than 2 seconds.     Findings: No rash.  Neurological:     Mental Status: She is alert.     Sensory: No sensory deficit.  Psychiatric:        Mood and Affect: Mood normal.    ED Results / Procedures / Treatments   Labs (all labs ordered are listed, but only abnormal results are displayed) Labs Reviewed - No data to display  EKG None  Radiology DG Hand Complete Right  Result Date: 07/20/2021 CLINICAL DATA:  Fall EXAM: RIGHT HAND - COMPLETE 3 VIEW COMPARISON:  None Available. FINDINGS: There is no evidence of fracture or dislocation. Mild  degenerative changes of the distal IP joints. Soft tissues are unremarkable. IMPRESSION: No acute osseous abnormality. Electronically Signed   By: Yetta Glassman M.D.   On: 07/20/2021 09:17    Procedures Procedures   Medications Ordered in ED Medications - No data to display  ED Course/ Medical Decision Making/ A&P                           Medical Decision Making Amount and/or Complexity of Data Reviewed Radiology: ordered.   This patient presents to the ED for concern of hand injury. Imaging Studies: I ordered imaging studies including x-ray of hand. I independently visualized and  interpreted imaging which showed no abnormalities. I agree with the radiologist interpretation.    Medications: Declined NSAIDs, said that she is not supposed to take them since she had a kidney stone.  We discussed that her kidney function was normal in February but because she is on Plavix, we will avoid NSAIDs.   Disposition: After consideration of the diagnostic results and the patients response to treatment, I feel that the patient is stable for discharge home with Ace wrap.  She will avoid NSAIDs because she is anticoagulated.  Discharged with Tylenol and RICE therapy and was neurovascularly intact when she ambulated out of the department.  Final Clinical Impression(s) / ED Diagnoses Final diagnoses:  Hand pain, right    Rx / DC Orders Results and diagnoses were explained to the patient. Return precautions discussed in full. Patient had no additional questions and expressed complete understanding.   This chart was dictated using voice recognition software.  Despite best efforts to proofread,  errors can occur which can change the documentation meaning.    Rhae Hammock, PA-C 07/20/21 4175    Jeanell Sparrow, DO 07/20/21 1529

## 2021-07-21 ENCOUNTER — Ambulatory Visit: Payer: Medicare PPO | Attending: Physical Medicine and Rehabilitation

## 2021-07-21 DIAGNOSIS — M5459 Other low back pain: Secondary | ICD-10-CM | POA: Insufficient documentation

## 2021-07-21 DIAGNOSIS — R262 Difficulty in walking, not elsewhere classified: Secondary | ICD-10-CM | POA: Diagnosis present

## 2021-07-21 DIAGNOSIS — M6281 Muscle weakness (generalized): Secondary | ICD-10-CM | POA: Diagnosis present

## 2021-07-21 DIAGNOSIS — M62830 Muscle spasm of back: Secondary | ICD-10-CM | POA: Diagnosis present

## 2021-07-21 DIAGNOSIS — I69354 Hemiplegia and hemiparesis following cerebral infarction affecting left non-dominant side: Secondary | ICD-10-CM | POA: Diagnosis present

## 2021-07-21 NOTE — Therapy (Signed)
[facility] [address]  [place], [place], [address] Phone: [phone]   Fax:  [phone]  Physical Therapy Treatment  Patient Details  Name: Lindsay Rice MRN: [id number] Date of Birth: [birth year] Referring Provider (PT): Courtney Heys, MD   Encounter Date: 07/21/2021   PT End of Session - 07/21/21 1057     Visit Number 16    Number of Visits 21    Date for PT Re-Evaluation 08/14/21    Authorization Type [employer] & [place] [employer]: 07/10/21-08/14/21    Authorization - Visit Number 3    Authorization - Number of Visits 10    PT Start Time 1006    PT Stop Time 1050    PT Time Calculation (min) 44 min    Activity Tolerance Patient tolerated treatment well    Behavior During Therapy [facility] for tasks assessed/performed             Past Medical History:  Diagnosis Date   Diabetes mellitus without complication ([place])    Hypertension    Kidney stone    Migraine headache    Mitral valve prolapse    Obesity (BMI 30.0-34.9)    Sleep apnea    Stroke [facility])     Past Surgical History:  Procedure Laterality Date   ABDOMINAL HYSTERECTOMY     ANKLE SURGERY Left    CHOLECYSTECTOMY     KNEE SURGERY Right    NECK SURGERY     TONSILLECTOMY      There were no vitals filed for this visit.   Subjective Assessment - 07/21/21 1006     Subjective Pt reports going to ED yesterday, she did have some swelling in her R hand and has some pain, now using an ACE wrap for edema and pain medication. The R knee is still bothering her.    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    Patient Stated Goals "To strengthen my L leg a little bit more"    Currently in Pain? Yes    Pain Score 5     Pain Location Knee    Pain Orientation Right    Pain  Descriptors / Indicators Aching    Pain Type Acute pain                [facility] PT Assessment - 07/21/21 0001       Observation/Other Assessments   Focus on Therapeutic Outcomes (FOTO)  Neuromuscular disorder = 60 (risk adjusted = 48); predicted = 65                           [facility] Adult PT Treatment/Exercise - 07/21/21 0001       Lumbar Exercises: Aerobic   Recumbent Bike L2x60mn      Lumbar Exercises: Standing   Other Standing Lumbar Exercises standing 6 way WS modified SLS with back leg  R/L 10x with one arm counter support    Other Standing Lumbar Exercises retro step 10x R/L; next set progressed with arm raise 10x      Knee/Hip Exercises: Standing   Hip Flexion AROM;Both;20 reps;Knee bent    Hip Flexion Limitations one arm wall support    SLS dot excursion 4 way stepping with R/L LE no UE  support 10x - more difficulty going backward    Other Standing Knee Exercises single leg deadlift with arm touch to mat table 10x R/L                       PT Short Term Goals - 06/07/21 1034       PT SHORT TERM GOAL #1   Title Patient will be independent in initial HEP to improve strength/mobility for better functional independence with ADLs    Status Achieved   06/07/21   Target Date 06/19/21               PT Long Term Goals - 07/10/21 0940       PT LONG TERM GOAL #1   Title Patient will demonstrate independent use of ongoing/advanced HEP to facilitate ability to maintain/progress functional gains from skilled physical therapy services    Status Partially Met    Target Date 08/14/21      PT LONG TERM GOAL #2   Title Patient will demonstrate improved B LE strength to >/= 4+/5 for improved stability and ease of mobility    Status Partially Met   07/10/21 - met except L hip flexion 4/5   Target Date 08/14/21      PT LONG TERM GOAL #3   Title Patient to report reduction in frequency and intensity of LBP by >/= 50% to allow for improved  activity and walking tolerance    Status Achieved   07/10/21 - Pt reports >50% reduction in LBP     PT LONG TERM GOAL #4   Title Patient will improve 5x STS time to </= 12.6 seconds to demonstrated improved functional strength and transfer efficiency    Baseline 15.59 sec (05/29/21); 13.67 sec (06/21/21); 10.91 sec (06/28/21)    Status Achieved   06/28/21     PT LONG TERM GOAL #5   Title Patient to report ability to perform ADLs, household tasks and leisure activities including walking her dog without limitation due to pain, weakness or lack of endurance    Status Achieved      PT LONG TERM GOAL #6   Title Patient will improve FOTO to >/= 65 to demonstrate improving function    Baseline 58 (05/29/21); 54 (06/28/21 - down 4 points); 58 (07/10/21 - back to baseline)    Status On-going    Target Date 08/14/21      PT LONG TERM GOAL #7   Title Patient will improve FGA score to >/= 25/30 to improve gait stability and reduce risk for falls    Status New    Target Date 08/14/21                   Plan - 07/21/21 1058     Clinical Impression Statement Pt demonstrated a good response to treatment w/o any increased pain. Worked on proprioceptive exercises to improve muscular contraction around joints to improve balance. Pt was more challenged with posterior steppping. Cues given during session to isolate hip abductors during the this exercise. Close superivision and guarding was required for safety, instruction was given for proper movement and WS with exercises to allow for co-contraction of muscles.    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 post-op,  L ankle surgery for tumor excision,  R knee surgery 05/03/20 - arthroscopy with partial medial meniscectomy, DM, HTN, migraine headaches, vertigo, hypothyroidism, asthma, sleep apnea, GI dysfunction, incontinence, kidney stones, BMI >30    PT Frequency 2x / week    PT Duration Other (comment)    PT Treatment/Interventions ADLs/Self Care Home Management;Cryotherapy;Electrical Stimulation;Moist Heat;DME Instruction;Gait training;Stair training;Functional mobility training;Therapeutic activities;Therapeutic exercise;Balance training;Neuromuscular re-education;Patient/family education;Manual techniques;Passive range of motion;Dry needling;Taping;Spinal Manipulations    PT Next Visit Plan balance training and dynamic stepping activities along with visual scanning tasks; progress lumbopelvic/LE flexibility and strengthening; shoulder strengthening to address R shoulder pain; MT and/or modalitites as indicated    PT Home Exercise Plan Access Code: [email or web address]    Consulted and Agree with Plan of Care Patient             Patient will benefit from skilled therapeutic intervention in order to improve the following deficits and impairments:  Decreased activity tolerance, Decreased balance, Decreased endurance, Decreased knowledge of precautions, Decreased mobility, Decreased strength, Difficulty walking, Increased fascial restricitons, Increased muscle spasms, Impaired perceived functional ability, Impaired flexibility, Impaired UE functional use, Postural dysfunction, Improper body mechanics, Pain  Visit Diagnosis: Difficulty in walking, not elsewhere classified  Muscle weakness (generalized)  Other low back pain  Muscle spasm of back  Hemiplegia and hemiparesis following cerebral infarction affecting left non-dominant side [facility])     Problem List Patient Active Problem List   Diagnosis Date Noted   Sepsis with acute organ dysfunction without septic shock ([place]) 03/29/2021   Fever, unknown origin 03/29/2021   Fever 03/29/2021    Essential hypertension 03/10/2021   Diabetes mellitus ([place]) 03/10/2021   Left hemiparesis ([place]) 03/10/2021   Infarction of right basal ganglia ([place]) 02/10/2021   Stroke determined by clinical assessment ([place]) 02/07/2021   Other obesity due to excess calories 08/09/2016   DDD (degenerative disc disease), cervical 10/28/2015   Chronic headache 05/05/2015   Acquired hypothyroidism 03/24/2015    Artist Pais, PTA 07/21/2021, 11:56 AM  [facility] [address]  [place], [place], [address] Phone: [phone]   Fax:  [phone]  Name: Lindsay Rice MRN: [id number] Date of Birth: 1950/07/19

## 2021-07-26 ENCOUNTER — Ambulatory Visit: Payer: Medicare PPO | Admitting: Physical Therapy

## 2021-07-26 ENCOUNTER — Encounter: Payer: Self-pay | Admitting: Physical Therapy

## 2021-07-26 DIAGNOSIS — I69354 Hemiplegia and hemiparesis following cerebral infarction affecting left non-dominant side: Secondary | ICD-10-CM

## 2021-07-26 DIAGNOSIS — M62830 Muscle spasm of back: Secondary | ICD-10-CM

## 2021-07-26 DIAGNOSIS — R262 Difficulty in walking, not elsewhere classified: Secondary | ICD-10-CM

## 2021-07-26 DIAGNOSIS — M5459 Other low back pain: Secondary | ICD-10-CM

## 2021-07-26 DIAGNOSIS — M6281 Muscle weakness (generalized): Secondary | ICD-10-CM

## 2021-07-26 NOTE — Therapy (Signed)
[facility] [address]  [place], [place], [address] Phone: [phone]   Fax:  [phone]  Physical Therapy Treatment  Patient Details  Name: Lindsay Rice MRN: [id number] Date of Birth: 07-18-1950 Referring Provider (PT): Courtney Heys, MD   Encounter Date: 07/26/2021   PT End of Session - 07/26/21 1100     Visit Number 17    Number of Visits 21    Date for PT Re-Evaluation 08/14/21    Authorization Type [employer] & [place] Medicare: 07/10/21-08/14/21    Authorization - Visit Number 4    Authorization - Number of Visits 10    PT Start Time 1100    PT Stop Time 1145    PT Time Calculation (min) 45 min    Activity Tolerance Patient tolerated treatment well    Behavior During Therapy [facility] for tasks assessed/performed             Past Medical History:  Diagnosis Date   Diabetes mellitus without complication ([place])    Hypertension    Kidney stone    Migraine headache    Mitral valve prolapse    Obesity (BMI 30.0-34.9)    Sleep apnea    Stroke [facility])     Past Surgical History:  Procedure Laterality Date   ABDOMINAL HYSTERECTOMY     ANKLE SURGERY Left    CHOLECYSTECTOMY     KNEE SURGERY Right    NECK SURGERY     TONSILLECTOMY      There were no vitals filed for this visit.   Subjective Assessment - 07/26/21 1105     Subjective Pt reports her R hand is still swollen but moving better. Due to difficulty gripping her cane 2 her R hand, she has been walking more w/o the cane and has not noted any issues. She will have the loop recorder insertion procedure tomorrow.    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    Patient Stated Goals "To strengthen my L leg a little bit more"    Currently in Pain? Yes    Pain Score 2      Pain Location Knee    Pain Orientation Right    Pain Score 0    Pain Location Shoulder    Pain Orientation Right                               [facility] Adult PT Treatment/Exercise - 07/26/21 1100       Neuro Re-ed    Neuro Re-ed Details  Visual scanning while walking through clinic to locate and retreive 10 cones.  Alt fwd step with crossboday reach to cone atop 36" FR x 20, adding Airex pad under fwd foot for add'l 10 reps on each side.      Lumbar Exercises: Aerobic   Nustep L7 x 7 min (UE/LE)                 Balance Exercises - 07/26/21 1100       Balance Exercises: Standing   SLS Eyes open;Solid surface;Foam/compliant surface;Upper extremity support 1;5 reps;10 secs   aiming for 10 sec   SLS Limitations only able to maintain SLS on foam oval for 3-5 sec  SLS with Vectors Foam/compliant surface;5 reps    SLS with Vectors Limitations standing on blue foam oval + 4-way tap to balance pebbles                  PT Short Term Goals - 06/07/21 1034       PT SHORT TERM GOAL #1   Title Patient will be independent in initial HEP to improve strength/mobility for better functional independence with ADLs    Status Achieved   06/07/21   Target Date 06/19/21               PT Long Term Goals - 07/26/21 1145       PT LONG TERM GOAL #1   Title Patient will demonstrate independent use of ongoing/advanced HEP to facilitate ability to maintain/progress functional gains from skilled physical therapy services    Status Partially Met    Target Date 08/14/21      PT LONG TERM GOAL #2   Title Patient will demonstrate improved B LE strength to >/= 4+/5 for improved stability and ease of mobility    Status Partially Met   07/10/21 - met except L hip flexion 4/5   Target Date 08/14/21      PT LONG TERM GOAL #3   Title Patient to report reduction in frequency and intensity of LBP by >/= 50% to allow for improved activity and walking tolerance    Status  Achieved   07/10/21 - Pt reports >50% reduction in LBP     PT LONG TERM GOAL #4   Title Patient will improve 5x STS time to </= 12.6 seconds to demonstrated improved functional strength and transfer efficiency    Baseline 15.59 sec (05/29/21); 13.67 sec (06/21/21); 10.91 sec (06/28/21)    Status Achieved   06/28/21     PT LONG TERM GOAL #5   Title Patient to report ability to perform ADLs, household tasks and leisure activities including walking her dog without limitation due to pain, weakness or lack of endurance    Status Achieved      PT LONG TERM GOAL #6   Title Patient will improve FOTO to >/= 65 to demonstrate improving function    Baseline 58 (05/29/21); 54 (06/28/21 - down 4 points); 58 (07/10/21 - back to baseline)    Status On-going    Target Date 08/14/21      PT LONG TERM GOAL #7   Title Patient will improve FGA score to >/= 25/30 to improve gait stability and reduce risk for falls    Status On-going    Target Date 08/14/21                   Plan - 07/26/21 1145     Clinical Impression Statement Lindsay Rice reports decreasing reliance on her cane since her hand has been sore/swollen but denies any concerns regarding unsteadiness. She feels that she still needs to work on her balance, therefore session predominantly focusing on static and dynamic standing balance as well as visual scanning and stability during gait. Pt becoming frustrated with limited ability to complete SLS on soft/compliant surfaces, therefore transitioned to firm surface while reassuring pt that the intent of therapeutic activities is to challenge her and not all activities will be easy or mastered on the initial attempt.    Comorbidities Acute on chronic LBP, lumbar DDD/spondylosis with lumbar radiculopathy, SIJ degenerative changes/OA, L HS tendinitis, R hip bursitis, cervical DDD, OA, chronic R RTC tear s/p 2 failed  RCR with repeat tears post-op, L ankle surgery for tumor excision, R knee surgery 05/03/20 -  arthroscopy with partial medial meniscectomy, DM, HTN, migraine headaches, vertigo, hypothyroidism, asthma, sleep apnea, GI dysfunction, incontinence, kidney stones, BMI >30    PT Frequency 2x / week    PT Duration Other (comment)   4-5 weeks   PT Treatment/Interventions ADLs/Self Care Home Management;Cryotherapy;Electrical Stimulation;Moist Heat;DME Instruction;Gait training;Stair training;Functional mobility training;Therapeutic activities;Therapeutic exercise;Balance training;Neuromuscular re-education;Patient/family education;Manual techniques;Passive range of motion;Dry needling;Taping;Spinal Manipulations    PT Next Visit Plan balance training and dynamic stepping activities along with visual scanning tasks; progress lumbopelvic/LE flexibility and strengthening; shoulder strengthening to address R shoulder pain; MT and/or modalitites as indicated    PT Home Exercise Plan Access Code: [email or web address]    Consulted and Agree with Plan of Care Patient             Patient will benefit from skilled therapeutic intervention in order to improve the following deficits and impairments:  Decreased activity tolerance, Decreased balance, Decreased endurance, Decreased knowledge of precautions, Decreased mobility, Decreased strength, Difficulty walking, Increased fascial restricitons, Increased muscle spasms, Impaired perceived functional ability, Impaired flexibility, Impaired UE functional use, Postural dysfunction, Improper body mechanics, Pain  Visit Diagnosis: Difficulty in walking, not elsewhere classified  Muscle weakness (generalized)  Other low back pain  Muscle spasm of back  Hemiplegia and hemiparesis following cerebral infarction affecting left non-dominant side ([facility])   Rationale for Evaluation and Treatment: Rehabilitation   Problem List Patient Active Problem List   Diagnosis Date Noted   Sepsis with acute organ dysfunction without septic shock ([place]) 03/29/2021   Fever, unknown  origin 03/29/2021   Fever 03/29/2021   Essential hypertension 03/10/2021   Diabetes mellitus ([place]) 03/10/2021   Left hemiparesis ([place]) 03/10/2021   Infarction of right basal ganglia ([place]) 02/10/2021   Stroke determined by clinical assessment ([place]) 02/07/2021   Other obesity due to excess calories 08/09/2016   DDD (degenerative disc disease), cervical 10/28/2015   Chronic headache 05/05/2015   Acquired hypothyroidism 03/24/2015    Percival Spanish, PT 07/26/2021, 2:17 PM  [facility] [address]  [place], [place], [address] Phone: [phone]   Fax:  [phone]  Name: Lindsay Rice MRN: [id number] Date of Birth: January 25, 1951

## 2021-07-28 ENCOUNTER — Encounter: Payer: Medicare PPO | Admitting: Physical Therapy

## 2021-08-01 ENCOUNTER — Encounter: Payer: Medicare PPO | Admitting: Physical Therapy

## 2021-08-08 ENCOUNTER — Ambulatory Visit: Payer: Medicare PPO | Admitting: Physical Therapy

## 2021-08-08 ENCOUNTER — Encounter: Payer: Self-pay | Admitting: Physical Therapy

## 2021-08-08 DIAGNOSIS — M62830 Muscle spasm of back: Secondary | ICD-10-CM

## 2021-08-08 DIAGNOSIS — M6281 Muscle weakness (generalized): Secondary | ICD-10-CM

## 2021-08-08 DIAGNOSIS — R262 Difficulty in walking, not elsewhere classified: Secondary | ICD-10-CM | POA: Diagnosis not present

## 2021-08-08 DIAGNOSIS — M5459 Other low back pain: Secondary | ICD-10-CM

## 2021-08-08 NOTE — Therapy (Signed)
[facility] [address]  [place], [place], [address] Phone: [phone]   Fax:  [phone]  Physical Therapy Treatment  Patient Details  Name: Lindsay Rice MRN: [id number] Date of Birth: [birth year] Referring Provider (PT): Courtney Heys, MD   Encounter Date: 08/08/2021   PT End of Session - 08/08/21 1106     Visit Number 18    Number of Visits 21    Date for PT Re-Evaluation 08/14/21    Authorization Type [employer] Time Period [employer]: 07/10/21-08/14/21    PT Start Time 1105    PT Stop Time 1146    PT Time Calculation (min) 41 min    Activity Tolerance Patient tolerated treatment well    Behavior During Therapy [facility] for tasks assessed/performed             Past Medical History:  Diagnosis Date   Diabetes mellitus without complication ([place])    Hypertension    Kidney stone    Migraine headache    Mitral valve prolapse    Obesity (BMI 30.0-34.9)    Sleep apnea    Stroke [facility])     Past Surgical History:  Procedure Laterality Date   ABDOMINAL HYSTERECTOMY     ANKLE SURGERY Left    CHOLECYSTECTOMY     KNEE SURGERY Right    NECK SURGERY     TONSILLECTOMY      There were no vitals filed for this visit.   Subjective Assessment - 08/08/21 1107     Subjective Had loop recorder inserted and she is sore. "We'll have to do leg things today". My back has been giving me a fit. continues to reports spasms when standing to do dishes.    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 Yes    Pain Score 2     Pain Location Knee    Pain Orientation Right    Pain Descriptors / Indicators Aching    Pain Score 3    Pain Location Shoulder    Pain Orientation Right    Pain Descriptors / Indicators Aching                                [facility] Adult PT Treatment/Exercise - 08/08/21 0001       Lumbar Exercises: Aerobic   Nustep L7 x 7 min (UE/LE)                 Balance Exercises - 08/08/21 0001       Balance Exercises: Standing   SLS Eyes open;Solid surface;Foam/compliant surface;Upper extremity support 1;5 reps;10 secs   aiming for 10 sec   SLS Limitations increased stance time on blue oval to 10 sec today    SLS with Vectors Foam/compliant surface;5 reps;Upper extremity assist 1    SLS with Vectors Limitations standing on blue foam oval + 4-way tap to dots; also standing on pebble and tapping dots   R more difficult   Rockerboard Anterior/posterior;Lateral    Step Over Hurdles / [place] stepping over hurdles x 4 then with while catching ball x 2    Other Standing Exercises cross body reaching behind x 10 ea; SLS cross body reaches x 10 bil,  Other Standing Exercises Comments Agility ladder: various agility steps fwd/bwd/lateral/ins/outs                  PT Short Term Goals - 06/07/21 1034       PT SHORT TERM GOAL #1   Title Patient will be independent in initial HEP to improve strength/mobility for better functional independence with ADLs    Status Achieved   06/07/21   Target Date 06/19/21               PT Long Term Goals - 07/26/21 1145       PT LONG TERM GOAL #1   Title Patient will demonstrate independent use of ongoing/advanced HEP to facilitate ability to maintain/progress functional gains from skilled physical therapy services    Status Partially Met    Target Date 08/14/21      PT LONG TERM GOAL #2   Title Patient will demonstrate improved B LE strength to >/= 4+/5 for improved stability and ease of mobility    Status Partially Met   07/10/21 - met except L hip flexion 4/5   Target Date 08/14/21      PT LONG TERM GOAL #3   Title Patient to report reduction in frequency and intensity of LBP by >/= 50% to allow for improved activity and walking tolerance    Status Achieved    07/10/21 - Pt reports >50% reduction in LBP     PT LONG TERM GOAL #4   Title Patient will improve 5x STS time to </= 12.6 seconds to demonstrated improved functional strength and transfer efficiency    Baseline 15.59 sec (05/29/21); 13.67 sec (06/21/21); 10.91 sec (06/28/21)    Status Achieved   06/28/21     PT LONG TERM GOAL #5   Title Patient to report ability to perform ADLs, household tasks and leisure activities including walking her dog without limitation due to pain, weakness or lack of endurance    Status Achieved      PT LONG TERM GOAL #6   Title Patient will improve FOTO to >/= 65 to demonstrate improving function    Baseline 58 (05/29/21); 54 (06/28/21 - down 4 points); 58 (07/10/21 - back to baseline)    Status On-going    Target Date 08/14/21      PT LONG TERM GOAL #7   Title Patient will improve FGA score to >/= 25/30 to improve gait stability and reduce risk for falls    Status On-going    Target Date 08/14/21                   Plan - 08/08/21 1110     Clinical Impression Statement Lindsay Rice reports increased R shoulder pain and LBP today. She had the loop recorder inserted without issue. Saw MD yesterday for this and it looks good. No OH activity for right now. She was much improved with SLS on foam oval today. Agility ladder was challenging when left foot leads and with in/outs moving laterally with ladder placed behind pt. Good control with obstacles during ball toss.    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 with repeat tears post-op, L ankle surgery for tumor excision, R knee surgery 05/03/20 - arthroscopy with partial medial meniscectomy, DM, HTN, migraine headaches, vertigo, hypothyroidism, asthma, sleep apnea, GI dysfunction, incontinence, kidney stones, BMI >30  PT Frequency 2x / week    PT  Treatment/Interventions ADLs/Self Care Home Management;Cryotherapy;Electrical Stimulation;Moist Heat;DME Instruction;Gait training;Stair training;Functional mobility training;Therapeutic activities;Therapeutic exercise;Balance training;Neuromuscular re-education;Patient/family education;Manual techniques;Passive range of motion;Dry needling;Taping;Spinal Manipulations    PT Next Visit Plan balance training and dynamic stepping activities along with visual scanning tasks; progress lumbopelvic/LE flexibility and strengthening; shoulder strengthening to address R shoulder pain; MT and/or modalitites as indicated    PT Home Exercise Plan Access Code: [email or web address]    Consulted and Agree with Plan of Care Patient             Patient will benefit from skilled therapeutic intervention in order to improve the following deficits and impairments:  Decreased activity tolerance, Decreased balance, Decreased endurance, Decreased knowledge of precautions, Decreased mobility, Decreased strength, Difficulty walking, Increased fascial restricitons, Increased muscle spasms, Impaired perceived functional ability, Impaired flexibility, Impaired UE functional use, Postural dysfunction, Improper body mechanics, Pain  Visit Diagnosis: Difficulty in walking, not elsewhere classified  Muscle weakness (generalized)  Other low back pain  Muscle spasm of back     Problem List Patient Active Problem List   Diagnosis Date Noted   Sepsis with acute organ dysfunction without septic shock ([place]) 03/29/2021   Fever, unknown origin 03/29/2021   Fever 03/29/2021   Essential hypertension 03/10/2021   Diabetes mellitus ([place]) 03/10/2021   Left hemiparesis ([place]) 03/10/2021   Infarction of right basal ganglia ([place]) 02/10/2021   Stroke determined by clinical assessment ([place]) 02/07/2021   Other obesity due to excess calories 08/09/2016   DDD (degenerative disc disease), cervical 10/28/2015   Chronic headache 05/05/2015    Acquired hypothyroidism 03/24/2015    Madelyn Flavors, PT 08/08/2021, 2:18 PM  [facility] [address]  [place], [place], [address] Phone: [phone]   Fax:  [phone]  Name: Lindsay Rice MRN: [id number] Date of Birth: 1950-10-29

## 2021-08-10 ENCOUNTER — Encounter: Payer: Medicare PPO | Admitting: Physical Therapy

## 2021-08-11 ENCOUNTER — Ambulatory Visit: Payer: Medicare PPO

## 2021-08-11 DIAGNOSIS — R262 Difficulty in walking, not elsewhere classified: Secondary | ICD-10-CM

## 2021-08-11 DIAGNOSIS — M5459 Other low back pain: Secondary | ICD-10-CM

## 2021-08-11 DIAGNOSIS — M6281 Muscle weakness (generalized): Secondary | ICD-10-CM

## 2021-08-11 DIAGNOSIS — M62830 Muscle spasm of back: Secondary | ICD-10-CM

## 2021-08-11 NOTE — Therapy (Signed)
[facility] [address]  [address] [place], [place], [address] Phone: [phone]   Fax:  [phone]  Physical Therapy Treatment  Patient Details  Name: Lindsay Rice MRN: [id number] Date of Birth: 10/15/1950 Referring Provider (PT): Genice Rouge, MD   Encounter Date: 08/11/2021   PT End of Session - 08/11/21 1155     Visit Number 19    Number of Visits 21    Date for PT Re-Evaluation 08/14/21    Authorization Type [employer]    Authorization Time Period [employer]: 07/10/21-08/14/21    PT Start Time 1105    PT Stop Time 1147    PT Time Calculation (min) 42 min    Activity Tolerance Patient tolerated treatment well    Behavior During Therapy [facility] for tasks assessed/performed             Past Medical History:  Diagnosis Date   Diabetes mellitus without complication ([facility])    Hypertension    Kidney stone    Migraine headache    Mitral valve prolapse    Obesity (BMI 30.0-34.9)    Sleep apnea    Stroke [facility])     Past Surgical History:  Procedure Laterality Date   ABDOMINAL HYSTERECTOMY     ANKLE SURGERY Left    CHOLECYSTECTOMY     KNEE SURGERY Right    NECK SURGERY     TONSILLECTOMY      There were no vitals filed for this visit.   Subjective Assessment - 08/11/21 1112     Subjective Pt reports no pain, avoiding UE exercise d/t operation 2 weeks to install loop recorder, feels ready to transition to HEP within next session.    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 No/denies                               [facility] Adult PT Treatment/Exercise - 08/11/21 0001       Lumbar Exercises: Aerobic   Nustep L7 x 7 min (UE/LE)      Lumbar Exercises: Standing   Heel Raises 10 reps    Heel Raises Limitations eccentric heel raise B con/R/L single leg  eccentric    Other Standing Lumbar Exercises bird dog standing 10 reps each      Knee/Hip Exercises: Standing   Hip Flexion Stengthening;Both;10 reps;Knee straight    Hip Flexion Limitations green TB at ankles    Hip ADduction Strengthening;Both;10 reps    Hip ADduction Limitations green TB at ankles    Hip Abduction Stengthening;Both;10 reps;Knee straight    Abduction Limitations green TB at ankles    Hip Extension Stengthening;Both;10 reps;Knee straight    Extension Limitations green TB                     PT Education - 08/11/21 1154     Education Details review of HEP; administered green TB    Person(s) Educated Patient    Methods Explanation;Demonstration;Verbal cues;Handout    Comprehension Verbalized understanding;Returned demonstration;Verbal cues required              PT Short Term Goals - 06/07/21 1034       PT SHORT TERM GOAL #1   Title Patient will be independent  in initial HEP to improve strength/mobility for better functional independence with ADLs    Status Achieved   05/29/79   Target Date 06/19/21               PT Long Term Goals - 07/26/21 1145       PT LONG TERM GOAL #1   Title Patient will demonstrate independent use of ongoing/advanced HEP to facilitate ability to maintain/progress functional gains from skilled physical therapy services    Status Partially Met    Target Date 08/14/21      PT LONG TERM GOAL #2   Title Patient will demonstrate improved B LE strength to >/= 4+/5 for improved stability and ease of mobility    Status Partially Met   07/10/21 - met except L hip flexion 4/5   Target Date 08/14/21      PT LONG TERM GOAL #3   Title Patient to report reduction in frequency and intensity of LBP by >/= 50% to allow for improved activity and walking tolerance    Status Achieved   07/10/21 - Pt reports >50% reduction in LBP     PT LONG TERM GOAL #4   Title Patient will improve 5x STS time to </= 12.6 seconds to demonstrated  improved functional strength and transfer efficiency    Baseline 15.59 sec (05/29/21); 13.67 sec (06/21/21); 10.91 sec (06/28/21)    Status Achieved   06/28/21     PT LONG TERM GOAL #5   Title Patient to report ability to perform ADLs, household tasks and leisure activities including walking her dog without limitation due to pain, weakness or lack of endurance    Status Achieved      PT LONG TERM GOAL #6   Title Patient will improve FOTO to >/= 65 to demonstrate improving function    Baseline 58 (05/29/21); 54 (06/28/21 - down 4 points); 58 (07/10/21 - back to baseline)    Status On-going    Target Date 08/14/21      PT LONG TERM GOAL #7   Title Patient will improve FGA score to >/= 25/30 to improve gait stability and reduce risk for falls    Status On-going    Target Date 08/14/21                   Plan - 08/11/21 1156     Clinical Impression Statement Today we focused the session on preparing the patient for 30 day hold to wrap up PT next visit. Reviewed HEP, discontinued certain exercises to progress and administered green TBs for UE and LE exercises. Pt showed better demonstration of eccentric heel raises vs single leg heel raise. She still has some difficulty with single leg balance so added bird dog to HEP. Overall patient, showed a good response to the instruction given and the progressed exercises, she should be ready for 30 day next visit.    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 post-op, L ankle surgery for tumor excision, R knee surgery 05/03/20 - arthroscopy with partial medial meniscectomy, DM, HTN, migraine headaches, vertigo, hypothyroidism, asthma, sleep apnea,  GI dysfunction, incontinence, kidney stones, BMI >30    PT Frequency 2x / week    PT Duration Other (comment)   4-5 weeks  PT Treatment/Interventions ADLs/Self Care Home Management;Cryotherapy;Electrical Stimulation;Moist Heat;DME Instruction;Gait training;Stair training;Functional mobility training;Therapeutic activities;Therapeutic exercise;Balance training;Neuromuscular re-education;Patient/family education;Manual techniques;Passive range of motion;Dry needling;Taping;Spinal Manipulations    PT Next Visit Plan 30 day hold; assess remaining goals   PT Home Exercise Plan Access Code: [email or web address]    Consulted and Agree with Plan of Care Patient             Patient will benefit from skilled therapeutic intervention in order to improve the following deficits and impairments:  Decreased activity tolerance, Decreased balance, Decreased endurance, Decreased knowledge of precautions, Decreased mobility, Decreased strength, Difficulty walking, Increased fascial restricitons, Increased muscle spasms, Impaired perceived functional ability, Impaired flexibility, Impaired UE functional use, Postural dysfunction, Improper body mechanics, Pain  Visit Diagnosis: Difficulty in walking, not elsewhere classified  Muscle weakness (generalized)  Other low back pain  Muscle spasm of back     Problem List Patient Active Problem List   Diagnosis Date Noted   Sepsis with acute organ dysfunction without septic shock ([facility]) 03/29/2021   Fever, unknown origin 03/29/2021   Fever 03/29/2021   Essential hypertension 03/10/2021   Diabetes mellitus ([facility]) 03/10/2021   Left hemiparesis ([facility]) 03/10/2021   Infarction of right basal ganglia ([facility]) 02/10/2021   Stroke determined by clinical assessment ([facility]) 02/07/2021   Other obesity due to excess calories 08/09/2016   DDD (degenerative disc disease), cervical 10/28/2015   Chronic headache 05/05/2015   Acquired hypothyroidism 03/24/2015    Darleene Cleaver,  PTA 08/11/2021, 12:12 PM  [facility] [address]  [address] [place], [place], [address] Phone: [phone]   Fax:  [phone]  Name: Tephanie Arch MRN: [id number] Date of Birth: [birth year]

## 2021-08-14 ENCOUNTER — Ambulatory Visit: Payer: Medicare PPO | Admitting: Physical Therapy

## 2021-08-14 ENCOUNTER — Encounter: Payer: Self-pay | Admitting: Physical Therapy

## 2021-08-14 DIAGNOSIS — R262 Difficulty in walking, not elsewhere classified: Secondary | ICD-10-CM

## 2021-08-14 DIAGNOSIS — M6281 Muscle weakness (generalized): Secondary | ICD-10-CM

## 2021-08-14 DIAGNOSIS — I69354 Hemiplegia and hemiparesis following cerebral infarction affecting left non-dominant side: Secondary | ICD-10-CM

## 2021-08-14 DIAGNOSIS — M62830 Muscle spasm of back: Secondary | ICD-10-CM

## 2021-08-14 DIAGNOSIS — M5459 Other low back pain: Secondary | ICD-10-CM

## 2021-08-14 NOTE — Therapy (Addendum)
[facility] [address]  [place], [place], [address] Phone: [phone]   Fax:  [phone]  Physical Therapy Treatment / Progress Note / Discharge Summary  Patient Details  Name: Lindsay Rice MRN: [id number] Date of Birth: [birth year] Referring Provider (PT): Courtney Heys, MD  Progress Note  Reporting Period 07/10/2021 to 08/14/2021  See note below for Objective Data and Assessment of Progress/Goals.     Encounter Date: 08/14/2021   PT End of Session - 08/14/21 1014     Visit Number 20    Number of Visits 21    Date for PT Re-Evaluation 08/14/21    Authorization Type [employer] & [place] [employer]: 07/10/21-08/14/21    Authorization - Visit Number 7    Authorization - Number of Visits 10    PT Start Time 5038    PT Stop Time 1104    PT Time Calculation (min) 50 min    Activity Tolerance Patient tolerated treatment well    Behavior During Therapy WFL for tasks assessed/performed             Past Medical History:  Diagnosis Date   Diabetes mellitus without complication ([place])    Hypertension    Kidney stone    Migraine headache    Mitral valve prolapse    Obesity (BMI 30.0-34.9)    Sleep apnea    Stroke [facility])     Past Surgical History:  Procedure Laterality Date   ABDOMINAL HYSTERECTOMY     ANKLE SURGERY Left    CHOLECYSTECTOMY     KNEE SURGERY Right    NECK SURGERY     TONSILLECTOMY      There were no vitals filed for this visit.   Subjective Assessment - 08/14/21 1019     Subjective Pt reports her back is giving her a fit today - had to use her TENS unit today. Thinks it may have been due to fighting to put her sheets on the bed yesterday.    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    Referring Provider (PT) Courtney Heys, MD    Onset Date/Surgical Date 02/07/21    Next MD Visit 09/18/21      Observation/Other Assessments   Focus on Therapeutic Outcomes (FOTO)  Neuromuscular disorder = 80, exceeding predicted vlaue of 65      Strength   Right Hip Flexion 4+/5    Right Hip Extension 5/5    Right Hip External Rotation  4+/5    Right Hip Internal Rotation 5/5    Right Hip ABduction 5/5    Right Hip ADduction 4+/5    Left Hip Flexion 4+/5    Left Hip Extension 5/5    Left Hip  External Rotation 4+/5    Left Hip Internal Rotation 5/5    Left Hip ABduction 4+/5    Left Hip ADduction 5/5    Right Knee Flexion 5/5    Right Knee Extension 5/5    Left Knee Flexion 5/5    Left Knee Extension 5/5    Right Ankle Dorsiflexion 5/5    Right Ankle Plantar Flexion 5/5   20 SLS heel raises - limited lift on some reps   Left Ankle Dorsiflexion 5/5    Left Ankle Plantar Flexion 5/5   20 SLS heel raises - limited lift on some reps     Standardized Balance Assessment   10 Meter Walk 8.78 sec w/o AD      Functional Gait  Assessment   Gait Level Surface Walks 20 ft in less than 5.5 sec, no assistive devices, good speed, no evidence for imbalance, normal gait pattern, deviates no more than 6 in outside of the 12 in walkway width.    Change in Gait Speed Able to smoothly change walking speed without loss of balance or gait deviation. Deviate no more than 6 in outside of the 12 in walkway width.    Gait with Horizontal Head Turns Performs head turns smoothly with no change in gait. Deviates no more than 6 in outside 12 in walkway width    Gait with Vertical Head Turns Performs head  turns with no change in gait. Deviates no more than 6 in outside 12 in walkway width.    Gait and Pivot Turn Pivot turns safely within 3 sec and stops quickly with no loss of balance.    Step Over Obstacle Is able to step over 2 stacked shoe boxes taped together (9 in total height) without changing gait speed. No evidence of imbalance.    Gait with Narrow Base of Support Ambulates 4-7 steps.    Gait with Eyes Closed Walks 20 ft, uses assistive device, slower speed, mild gait deviations, deviates 6-10 in outside 12 in walkway width. Ambulates 20 ft in less than 9 sec but greater than 7 sec.    Ambulating Backwards Walks 20 ft, uses assistive device, slower speed, mild gait deviations, deviates 6-10 in outside 12 in walkway width.    Steps Alternating feet, must use rail.    Total Score 25    FGA comment: 25-28 = low risk fall                           [facility] Adult PT Treatment/Exercise - 08/14/21 1014       Lumbar Exercises: Aerobic   Nustep L7 x 7 min (UE/LE)                       PT Short Term Goals - 06/07/21 1034       PT SHORT TERM GOAL #1   Title Patient will be independent in initial HEP to improve strength/mobility for better functional independence with ADLs    Status Achieved   06/07/21   Target Date 06/19/21               PT Long Term Goals - 08/14/21 1033       PT LONG TERM GOAL #1   Title Patient will demonstrate independent use of ongoing/advanced HEP to facilitate ability to maintain/progress functional gains from skilled physical therapy services    Status Achieved    Target Date 08/14/21  PT LONG TERM GOAL #2   Title Patient will demonstrate improved B LE strength to >/= 4+/5 for improved stability and ease of mobility    Status Achieved   08/14/21   Target Date 08/14/21      PT LONG TERM GOAL #3   Title Patient to report reduction in frequency and intensity of LBP by >/= 50% to allow for improved activity and walking  tolerance    Status Achieved   08/14/21 - Pt reports >75% overall reduction in LBP     PT LONG TERM GOAL #4   Title Patient will improve 5x STS time to </= 12.6 seconds to demonstrated improved functional strength and transfer efficiency    Baseline 15.59 sec (05/29/21); 13.67 sec (06/21/21); 10.91 sec (06/28/21)    Status Achieved   06/28/21     PT LONG TERM GOAL #5   Title Patient to report ability to perform ADLs, household tasks and leisure activities including walking her dog without limitation due to pain, weakness or lack of endurance    Status Achieved      PT LONG TERM GOAL #6   Title Patient will improve FOTO to >/= 65 to demonstrate improving function    Baseline 58 (05/29/21); 54 (06/28/21 - down 4 points); 58 (07/10/21 - back to baseline); 60 (07/21/21); 80 (08/14/21)    Status Achieved    Target Date 08/14/21      PT LONG TERM GOAL #7   Title Patient will improve FGA score to >/= 25/30 to improve gait stability and reduce risk for falls    Baseline 21/30 (07/10/21); 25/30 (08/14/21)    Status Achieved   08/14/21   Target Date 08/14/21                   Plan - 08/14/21 1100     Clinical Impression Statement Lindsay Rice has demonstrated excellent progress with PT following R basal ganglia infarct resulting in L hemiparesis. Her neuromuscular FOTO has improved from 58 to 80, exceeding the predicted value of 65. She has weaned from using a cane while walking notes improving activity tolerance with most daily activities, now only intermittently limited by chronic LBP. Her B LE strength is now essentially symmetrical and has improved to 4+/5 to 5/5 overall. Her balance has improved with her current FGA score of 25/30 now indicating only a low risk for falls. She has met all of her goals and feels ready to transition to her HEP but would like to remain on hold for 30-days in the event that issues arise that would necessitate a return to PT.    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 with repeat tears post-op, L ankle surgery for tumor excision, R knee surgery 05/03/20 - arthroscopy with partial medial meniscectomy, DM, HTN, migraine headaches, vertigo, hypothyroidism, asthma, sleep apnea, GI dysfunction, incontinence, kidney stones, BMI >30    Rehab Potential Good    PT Treatment/Interventions ADLs/Self Care Home Management;Cryotherapy;Electrical Stimulation;Moist Heat;DME Instruction;Gait training;Stair training;Functional mobility training;Therapeutic activities;Therapeutic exercise;Balance training;Neuromuscular re-education;Patient/family education;Manual techniques;Passive range of motion;Dry needling;Taping;Spinal Manipulations    PT Next Visit Plan transition to HEP + 30-day hold    PT Home Exercise Plan Access Code: [email or web address]    Consulted and Agree with Plan of Care Patient             Patient will benefit from  skilled therapeutic intervention in order to improve the following deficits and impairments:  Decreased activity tolerance, Decreased balance, Decreased endurance, Decreased knowledge of precautions, Decreased mobility, Decreased strength, Difficulty walking, Increased fascial restricitons, Increased muscle spasms, Impaired perceived functional ability, Impaired flexibility, Impaired UE functional use, Postural dysfunction, Improper body mechanics, Pain  Visit Diagnosis: Hemiplegia and hemiparesis following cerebral infarction affecting left non-dominant side (HCC)  Other low back pain  Muscle weakness (generalized)  Muscle spasm of back  Difficulty in walking, not elsewhere classified   Rationale for Evaluation and Treatment: Rehabilitation   Problem List Patient Active Problem List   Diagnosis Date Noted   Sepsis with acute organ dysfunction without septic shock ([place]) 03/29/2021   Fever,  unknown origin 03/29/2021   Fever 03/29/2021   Essential hypertension 03/10/2021   Diabetes mellitus ([place]) 03/10/2021   Left hemiparesis ([place]) 03/10/2021   Infarction of right basal ganglia ([place]) 02/10/2021   Stroke determined by clinical assessment ([place]) 02/07/2021   Other obesity due to excess calories 08/09/2016   DDD (degenerative disc disease), cervical 10/28/2015   Chronic headache 05/05/2015   Acquired hypothyroidism 03/24/2015    Percival Spanish, PT 08/14/2021, 12:31 PM  [facility] [address]  [place], [place], [address] Phone: [phone]   Fax:  [phone]  Name: Taelor Waymire MRN: [id number] Date of Birth: 1951/01/29   PHYSICAL THERAPY DISCHARGE SUMMARY  Visits from Start of Care: 20  Current functional level related to goals / functional outcomes:   Refer to above clinical impression and goal assessment for status as of last visit on 08/14/2021. Patient was placed on hold for 30 days and has not needed to return to PT, therefore will proceed with discharge from PT for this episode.    Remaining deficits:   As above.   Education / Equipment:   HEP   Patient agrees to discharge. Patient goals were met. Patient is being discharged due to meeting the stated rehab goals.  Percival Spanish, PT, MPT 10/03/21, 10:04 AM  [facility] [address]  [place], [place], [address] Phone: [phone]   Fax:  [phone]

## 2021-08-17 ENCOUNTER — Encounter: Payer: Medicare PPO | Admitting: Physical Therapy

## 2021-08-21 ENCOUNTER — Encounter: Payer: Medicare PPO | Admitting: Physical Therapy

## 2021-09-18 ENCOUNTER — Encounter: Payer: Self-pay | Admitting: Physical Medicine and Rehabilitation

## 2021-09-18 ENCOUNTER — Encounter: Payer: Medicare PPO | Attending: Physical Medicine and Rehabilitation | Admitting: Physical Medicine and Rehabilitation

## 2021-09-18 VITALS — BP 160/87 | HR 82 | Ht 65.5 in | Wt 194.2 lb

## 2021-09-18 DIAGNOSIS — R252 Cramp and spasm: Secondary | ICD-10-CM | POA: Diagnosis present

## 2021-09-18 DIAGNOSIS — G8194 Hemiplegia, unspecified affecting left nondominant side: Secondary | ICD-10-CM

## 2021-09-18 DIAGNOSIS — S6991XD Unspecified injury of right wrist, hand and finger(s), subsequent encounter: Secondary | ICD-10-CM

## 2021-09-18 DIAGNOSIS — I69398 Other sequelae of cerebral infarction: Secondary | ICD-10-CM | POA: Diagnosis present

## 2021-09-18 NOTE — Patient Instructions (Addendum)
Pt is a 71 yr old female with hx of L hemiparesis due to R basal ganglia infarct/posterior limb/R internal capsule-late 12/22- with hx of migraines and R RTC injury/tear; HTN; DM A1c 8.3,- down to 6.7 and hypothyroidism-  Here for f/u on stroke.  Stroke was 02/07/21   Would like to get MRI of R hand- ligament/tendon rupture suspected due to continued swelling in spite of treatment and negative Xray. She has loop recorder, so should be able to do MRI, but will put in paperwork- if cannot, will need hand CT.   2. Might need referral to Hand Ortho (sees Dr Willette Cluster at [facility] for Ortho).    3. Strength being regained- doing great- continue Home exercise program.    4. No formation of spasticity- has been 9 months, so will not form spasticity in future based on on research.   5. Has loop recorder now.   6. Pt sustained her R hand injury during physical therapy at [facility] 07/19/21 when PT asked her to walk with eyes closed and ran into elliptical- appears this is not pt's fault.   7. F/U in 3 months- just to follow up on hand. Might need hand therapy- we will see- however no more therapy for stroke unless has decline in function.

## 2021-09-18 NOTE — Progress Notes (Signed)
Subjective:    Patient ID: Lindsay Rice, female    DOB: 01/25/1951, 71 y.o.   MRN: [id number]  HPI Pt is a 71 yr old female with hx of L hemiparesis due to R basal ganglia infarct/posterior limb/R internal capsule-late 12/22- with hx of migraines and R RTC injury/tear; HTN; DM A1c 8.3,- down to 6.7 and hypothyroidism-  Here for f/u on stroke.  Stroke was 02/07/21   No spasticity- no muscle spasms.   Was told to shut eyes and walk- and ran into elliptical. R thumb is now numb and cannot really use R 2nd digit well.   Has had 2 xrays of R hand, so no fractures, but still very painful to use.   Got loop recorder, so not sure if can get MRI.   Stopped PT for 0 days after ran into elliptical- did exactly what PT told her.   Doing own HEP- daily-  Walking puppy 1/2 mile 4-5x/day.     Pain Inventory Average Pain 3 Pain Right Now 3 My pain is dull  LOCATION OF PAIN  back, head  BOWEL Number of stools per week: 7 Oral laxative use No  Type of laxative . Enema or suppository use No  History of colostomy No  Incontinent Yes   BLADDER Normal In and out cath, frequency . Able to self cath  . Bladder incontinence Yes  Frequent urination No  Leakage with coughing No  Difficulty starting stream No  Incomplete bladder emptying No    Mobility walk without assistance how many minutes can you walk? 30 ability to climb steps?  yes do you drive?  yes  Function disabled: date disabled 05/02/20  Neuro/Psych bladder control problems  Prior Studies Any changes since last visit?  no  Physicians involved in your care Any changes since last visit?  no   No family history on file. Social History   Socioeconomic History   Marital status: Married    Spouse name: Not on file   Number of children: Not on file   Years of education: Not on file   Highest education level: Not on file  Occupational History   Not on file  Tobacco Use   Smoking status: Never   Smokeless  tobacco: Never  Vaping Use   Vaping Use: Never used  Substance and Sexual Activity   Alcohol use: Not Currently   Drug use: Never   Sexual activity: Not Currently  Other Topics Concern   Not on file  Social History Narrative   Not on file   Social Determinants of Health   Financial Resource Strain: Not on file  Food Insecurity: Not on file  Transportation Needs: Not on file  Physical Activity: Not on file  Stress: Not on file  Social Connections: Not on file   Past Surgical History:  Procedure Laterality Date   ABDOMINAL HYSTERECTOMY     ANKLE SURGERY Left    CHOLECYSTECTOMY     KNEE SURGERY Right    NECK SURGERY     TONSILLECTOMY     Past Medical History:  Diagnosis Date   Diabetes mellitus without complication (HCC)    Hypertension    Kidney stone    Migraine headache    Mitral valve prolapse    Obesity (BMI 30.0-34.9)    Sleep apnea    Stroke (HCC)    BP (!) 160/87   Pulse 82   Ht 5' 5.5" (1.664 m)   Wt 194 lb 3.2 oz (88.1 kg)  SpO2 96%   BMI 31.83 kg/m   Opioid Risk Score:   Fall Risk Score:  `1  Depression screen [facility] 2/9     09/18/2021    9:40 AM 03/10/2021    1:07 PM  Depression screen PHQ 2/9  Decreased Interest 1 0  Down, Depressed, Hopeless 1 1  PHQ - 2 Score 2 1  Altered sleeping  1  Tired, decreased energy  1  Change in appetite  1  Feeling bad or failure about yourself   1  Trouble concentrating  1  Moving slowly or fidgety/restless  0  Suicidal thoughts  0  PHQ-9 Score  6    Review of Systems An entire ROS was completed and negative except for HPI    Objective:   Physical Exam Awake, alert, appropriate, no assistive device, NAD  MS: LUE 5/5 in LUE except FA which has improved to 5-/5 LLE- HF 5-/5 and otherwise 5/5 in LLE Effusion/swelling between 2nd/3rd MCP and also swelling of 2nd AND 3rd MCP area  Neuro: Decreased/with tingling in R thumb- entire thumb on R hand No decreased sensation in RUE otherwise No spasticity  /no increased tone however has Hoffman's LUE      Assessment & Plan:   Pt is a 71 yr old female with hx of L hemiparesis due to R basal ganglia infarct/posterior limb/R internal capsule-late 12/22- with hx of migraines and R RTC injury/tear; HTN; DM A1c 8.3,- down to 6.7 and hypothyroidism-  Here for f/u on stroke.  Stroke was 02/07/21   Would like to get MRI of R hand- ligament/tendon rupture suspected due to continued swelling in spite of treatment and negative Xray. She has loop recorder, so should be able to do MRI, but will put in paperwork- if cannot, will need hand CT.   2. Might need referral to Hand Ortho (sees Dr Willette Cluster at [facility] for [facility]o).    3. Strength being regained- doing great- continue Home exercise program.    4. No formation of spasticity- has been 9 months, so will not form spasticity in future based on on research.   5. Has loop recorder now.   6. Pt sustained her R hand injury during physical therapy at [facility] 07/19/21 when PT asked her to walk with eyes closed and ran into elliptical- appears this is not pt's fault.   7. F/U in 3 months- just to follow up on hand. Don't think she needs to go back to PT unless has decline in function.   8. Doesn't need more muscle relaxants- can refill in future if needed

## 2021-10-10 ENCOUNTER — Encounter: Payer: Self-pay | Admitting: Physical Medicine and Rehabilitation

## 2021-10-14 ENCOUNTER — Ambulatory Visit (HOSPITAL_BASED_OUTPATIENT_CLINIC_OR_DEPARTMENT_OTHER)
Admission: RE | Admit: 2021-10-14 | Discharge: 2021-10-14 | Disposition: A | Discharge status: Home / Self Care | Payer: Medicare PPO | Source: Ambulatory Visit | Attending: Physical Medicine and Rehabilitation | Admitting: Physical Medicine and Rehabilitation

## 2021-10-14 DIAGNOSIS — S6991XD Unspecified injury of right wrist, hand and finger(s), subsequent encounter: Secondary | ICD-10-CM | POA: Insufficient documentation

## 2021-10-18 ENCOUNTER — Encounter: Payer: Self-pay | Admitting: Physical Medicine and Rehabilitation

## 2021-10-18 DIAGNOSIS — S66911D Strain of unspecified muscle, fascia and tendon at wrist and hand level, right hand, subsequent encounter: Secondary | ICD-10-CM

## 2021-11-15 ENCOUNTER — Telehealth: Payer: Self-pay | Admitting: *Deleted

## 2021-11-15 DIAGNOSIS — S6991XD Unspecified injury of right wrist, hand and finger(s), subsequent encounter: Secondary | ICD-10-CM

## 2021-11-15 DIAGNOSIS — S66911D Strain of unspecified muscle, fascia and tendon at wrist and hand level, right hand, subsequent encounter: Secondary | ICD-10-CM

## 2021-11-15 NOTE — Telephone Encounter (Signed)
New Referral placed to [facility] for Dr Sherilyn Cooter. Per [facility] :the patient needs to see a Copy. Unfortunately we had a hand surgeon Dr Tempie Donning, but he is no longer with [place] as of 10/19/21. He joined [facility]. So please redirect to [facility] or Dr Tempie Donning @ [facility].

## 2021-11-22 NOTE — Addendum Note (Signed)
Addended by: Courtney Heys on: 11/22/2021 02:09 PM   Modules accepted: Orders

## 2022-01-03 ENCOUNTER — Encounter: Payer: Medicare PPO | Attending: Physical Medicine and Rehabilitation | Admitting: Physical Medicine and Rehabilitation

## 2022-01-03 ENCOUNTER — Encounter: Payer: Self-pay | Admitting: Physical Medicine and Rehabilitation

## 2022-01-03 VITALS — BP 150/86 | HR 92 | Ht 65.5 in | Wt 190.0 lb

## 2022-01-03 DIAGNOSIS — G8194 Hemiplegia, unspecified affecting left nondominant side: Secondary | ICD-10-CM | POA: Insufficient documentation

## 2022-01-03 MED ORDER — METHOCARBAMOL 500 MG PO TABS: 500.0000 mg | ORAL_TABLET | Freq: Three times a day (TID) | ORAL | 0 refills | Status: AC | PRN

## 2022-01-03 NOTE — Patient Instructions (Addendum)
Pt is a 71 yr old female with hx of L hemiparesis due to R basal ganglia infarct/posterior limb/R internal capsule-late 12/22- with hx of migraines and R RTC injury/tear; HTN; DM A1c 8.3,- down to 6.7 and hypothyroidism-  Here for f/u on stroke.  Stroke was 02/07/21 Also had R hand tenosynovitis and trigger finger per Ortho   Has been d/c;d from therapy for L sided weakness- con't walking and moving and home exercise program at home for L side  Seeing Dr Rosalia Hammers- Hand surgeon- November 27th is appointment for R hand  3. Will not d/c, but will see her prn/as needed if needs me.    4. Spasticity has resolved- no need for meds  5. Can refill Robaxin- muscle relaxant today but can get from PCP in future. 500 mg 3x/day as needed #50- no refills. Can refill for up to 6 months without another appointment  6. Controlled diabetes increases risk of stroke 4x/uncontrolled dibestes increases risk of another stroke 10+x.   7. Try eating protein BEFORE eating something sweet- or together- needs to be more than 10 grams of protein. Sugars will not go as high.

## 2022-01-03 NOTE — Progress Notes (Signed)
Subjective:    Patient ID: Lindsay Rice, female    DOB: 01-05-1951, 71 y.o.   MRN: [id number]  HPI  Pt is a 71 yr old female with hx of L hemiparesis due to R basal ganglia infarct/posterior limb/R internal capsule-late 12/22- with hx of migraines and R RTC injury/tear; HTN; DM A1c 8.3,- down to 6.7 and hypothyroidism-  Here for f/u on stroke.  Stroke was 02/07/21 Also had R hand tenosynovitis and trigger finger per Ortho  Got steroid injection x2 for "trigger finger" in R thumb and 3rd digit.   Going to Dr Rosalia Hammers in [place]- another Hydrograph[employer]-  It's not carpal tunnel per EMG/NCS.  Thumb and 3rd finger on R hand still tingling-  Picking thing sup still real difficult- trying to pick up coins and cnanot very easily- actually pretty hard.  Also peeling veggies is also difficult.   Doing well from stroke-   L sided weakness doing well.   Never went back for therapy after got hurt on R hand.   Got loop recorder placed- in last few months  Pain Inventory Average Pain 2 Pain Right Now 2 My pain is dull, tingling, and aching  In the last 24 hours, has pain interfered with the following? General activity 2 Relation with others 2 Enjoyment of life 2 What TIME of day is your pain at its worst? morning  Sleep (in general) Fair  Pain is worse with: some activites Pain improves with:  . Relief from Meds: 0  History reviewed. No pertinent family history. Social History   Socioeconomic History   Marital status: Married    Spouse name: Not on file   Number of children: Not on file   Years of education: Not on file   Highest education level: Not on file  Occupational History   Not on file  Tobacco Use   Smoking status: Never   Smokeless tobacco: Never  Vaping Use   Vaping Use: Never used  Substance and Sexual Activity   Alcohol use: Not Currently   Drug use: Never   Sexual activity: Not Currently  Other Topics Concern   Not on file  Social History Narrative   Not  on file   Social Determinants of Health   Financial Resource Strain: Not on file  Food Insecurity: Not on file  Transportation Needs: Not on file  Physical Activity: Not on file  Stress: Not on file  Social Connections: Not on file   Past Surgical History:  Procedure Laterality Date   ABDOMINAL HYSTERECTOMY     ANKLE SURGERY Left    CHOLECYSTECTOMY     KNEE SURGERY Right    NECK SURGERY     TONSILLECTOMY     Past Surgical History:  Procedure Laterality Date   ABDOMINAL HYSTERECTOMY     ANKLE SURGERY Left    CHOLECYSTECTOMY     KNEE SURGERY Right    NECK SURGERY     TONSILLECTOMY     Past Medical History:  Diagnosis Date   Diabetes mellitus without complication (HCC)    Hypertension    Kidney stone    Migraine headache    Mitral valve prolapse    Obesity (BMI 30.0-34.9)    Sleep apnea    Stroke (HCC)    BP (!) 150/86   Pulse 92   Ht 5' 5.5" (1.664 m)   Wt 190 lb (86.2 kg)   SpO2 97%   BMI 31.14 kg/m   Opioid Risk Score:  Fall Risk Score:  `1  Depression screen [facility] 2/9     01/03/2022   11:24 AM 09/18/2021    9:40 AM 03/10/2021    1:07 PM  Depression screen PHQ 2/9  Decreased Interest 0 1 0  Down, Depressed, Hopeless 0 1 1  PHQ - 2 Score 0 2 1  Altered sleeping   1  Tired, decreased energy   1  Change in appetite   1  Feeling bad or failure about yourself    1  Trouble concentrating   1  Moving slowly or fidgety/restless   0  Suicidal thoughts   0  PHQ-9 Score   6      Review of Systems  Musculoskeletal:  Positive for back pain.       Right Hand pain   All other systems reviewed and are negative.     Objective:   Physical Exam  152/83- a little high- stressed out driving here today Awake, alert, appropriate, no assistive device, NAD MS; RUE 5/5  except pinch on R hand is weak LUE 5-/5n deltoids, biceps, triceps, WE grip and FA RLE- 5/5  LLE- 5/5  Mild R hand swelling still No hoffman's or increased tone LUE/LLE no clonus     Assessment & Plan:  Pt is a 70 yr old female with hx of L hemiparesis due to R basal ganglia infarct/posterior limb/R internal capsule-late 12/22- with hx of migraines and R RTC injury/tear; HTN; DM A1c 8.3,- down to 6.7 and hypothyroidism-  Here for f/u on stroke.  Stroke was 02/07/21 Also had R hand tenosynovitis and trigger finger per Ortho   Has been d/c;d from therapy for L sided weakness- con't walking and moving and home exercise program at home for L side  Seeing Dr Jeanell Sparrow- Hand surgeon- November 27th is appointment for R hand  3. Will not d/c, but will see her prn/as needed if needs me.    4. Spasticity has resolved- no need for meds  5. Can refill Robaxin- muscle relaxant today but can get from PCP in future. 500 mg 3x/day as needed #50- no refills.    I spent a total of   21 minutes on total care today- >50% coordination of care- due to discussion of improvement and risk of another stroke
